# Patient Record
Sex: Female | Born: 1935
Health system: Southern US, Community
[De-identification: ages and names within clinical notes are randomized; demographics above are authoritative.]

## PROBLEM LIST (undated history)

## (undated) DIAGNOSIS — I10 Essential (primary) hypertension: Secondary | ICD-10-CM

## (undated) DIAGNOSIS — Z5189 Encounter for other specified aftercare: Secondary | ICD-10-CM

## (undated) DIAGNOSIS — M1990 Unspecified osteoarthritis, unspecified site: Secondary | ICD-10-CM

## (undated) HISTORY — PX: HEMORROIDECTOMY: SUR656

## (undated) HISTORY — PX: ABDOMINAL HYSTERECTOMY: SHX81

## (undated) HISTORY — PX: TUBAL LIGATION: SHX77

---

## 2001-08-24 ENCOUNTER — Emergency Department (HOSPITAL_COMMUNITY): Admission: EM | Admit: 2001-08-24 | Discharge: 2001-08-24 | Payer: Self-pay | Admitting: Emergency Medicine

## 2003-01-08 ENCOUNTER — Encounter: Payer: Self-pay | Admitting: Emergency Medicine

## 2003-01-08 ENCOUNTER — Emergency Department (HOSPITAL_COMMUNITY): Admission: EM | Admit: 2003-01-08 | Discharge: 2003-01-08 | Payer: Self-pay | Admitting: Emergency Medicine

## 2010-10-21 ENCOUNTER — Ambulatory Visit: Payer: Self-pay | Admitting: Internal Medicine

## 2011-02-01 ENCOUNTER — Emergency Department (HOSPITAL_COMMUNITY): Payer: Medicare Other

## 2011-02-01 ENCOUNTER — Emergency Department (HOSPITAL_COMMUNITY)
Admission: EM | Admit: 2011-02-01 | Discharge: 2011-02-01 | Disposition: A | Discharge status: Home / Self Care | Payer: Medicare Other | Attending: Emergency Medicine | Admitting: Emergency Medicine

## 2011-02-01 DIAGNOSIS — R030 Elevated blood-pressure reading, without diagnosis of hypertension: Secondary | ICD-10-CM | POA: Insufficient documentation

## 2011-02-01 DIAGNOSIS — R42 Dizziness and giddiness: Secondary | ICD-10-CM | POA: Insufficient documentation

## 2011-02-01 DIAGNOSIS — H9319 Tinnitus, unspecified ear: Secondary | ICD-10-CM | POA: Insufficient documentation

## 2011-02-01 DIAGNOSIS — R51 Headache: Secondary | ICD-10-CM | POA: Insufficient documentation

## 2011-02-01 LAB — COMPREHENSIVE METABOLIC PANEL
ALT: <8 U/L (ref 0–35)
BUN: 10 mg/dL (ref 6–23)
Calcium: 9.2 mg/dL (ref 8.4–10.5)
Creatinine, Ser: 0.66 mg/dL (ref 0.4–1.2)
Glucose, Bld: 98 mg/dL (ref 70–99)
Sodium: 140 mEq/L (ref 135–145)
Total Protein: 6.9 g/dL (ref 6.0–8.3)

## 2011-02-01 LAB — APTT: aPTT: 40 seconds — ABNORMAL HIGH (ref 24–37)

## 2011-02-01 LAB — DIFFERENTIAL
Basophils Absolute: 0 10*3/uL (ref 0.0–0.1)
Basophils Relative: 1 % (ref 0–1)
Eosinophils Absolute: 0.1 10*3/uL (ref 0.0–0.7)
Eosinophils Relative: 1 % (ref 0–5)
Lymphocytes Relative: 48 % — ABNORMAL HIGH (ref 12–46)
Lymphs Abs: 1.9 10*3/uL (ref 0.7–4.0)
Monocytes Absolute: 0.3 10*3/uL (ref 0.1–1.0)
Monocytes Relative: 8 % (ref 3–12)
Neutro Abs: 1.6 10*3/uL — ABNORMAL LOW (ref 1.7–7.7)
Neutrophils Relative %: 42 % — ABNORMAL LOW (ref 43–77)

## 2011-02-01 LAB — PROTIME-INR: INR: 1.08 (ref 0.00–1.49)

## 2011-02-01 LAB — CBC
Hemoglobin: 13.4 g/dL (ref 12.0–15.0)
MCH: 31 pg (ref 26.0–34.0)
MCHC: 34.1 g/dL (ref 30.0–36.0)
MCV: 91 fL (ref 78.0–100.0)
Platelets: 213 10*3/uL (ref 150–400)
RBC: 4.32 MIL/uL (ref 3.87–5.11)
RDW: 12.4 % (ref 11.5–15.5)
WBC: 3.9 10*3/uL — ABNORMAL LOW (ref 4.0–10.5)

## 2011-02-01 LAB — LIPASE, BLOOD: Lipase: 16 U/L (ref 11–59)

## 2013-09-12 ENCOUNTER — Encounter (HOSPITAL_COMMUNITY): Payer: Self-pay | Admitting: Emergency Medicine

## 2013-09-12 ENCOUNTER — Emergency Department (HOSPITAL_COMMUNITY)
Admission: EM | Admit: 2013-09-12 | Discharge: 2013-09-12 | Disposition: A | Discharge status: Home / Self Care | Payer: Medicare Other | Attending: Emergency Medicine | Admitting: Emergency Medicine

## 2013-09-12 DIAGNOSIS — M129 Arthropathy, unspecified: Secondary | ICD-10-CM | POA: Insufficient documentation

## 2013-09-12 DIAGNOSIS — I10 Essential (primary) hypertension: Secondary | ICD-10-CM | POA: Insufficient documentation

## 2013-09-12 DIAGNOSIS — Z90710 Acquired absence of both cervix and uterus: Secondary | ICD-10-CM | POA: Insufficient documentation

## 2013-09-12 DIAGNOSIS — N390 Urinary tract infection, site not specified: Secondary | ICD-10-CM | POA: Insufficient documentation

## 2013-09-12 DIAGNOSIS — Z9851 Tubal ligation status: Secondary | ICD-10-CM | POA: Insufficient documentation

## 2013-09-12 HISTORY — DX: Essential (primary) hypertension: I10

## 2013-09-12 HISTORY — DX: Encounter for other specified aftercare: Z51.89

## 2013-09-12 HISTORY — DX: Unspecified osteoarthritis, unspecified site: M19.90

## 2013-09-12 LAB — CBC WITH DIFFERENTIAL/PLATELET
Basophils Absolute: 0 10*3/uL (ref 0.0–0.1)
Basophils Relative: 0 % (ref 0–1)
HCT: 39.7 % (ref 36.0–46.0)
Hemoglobin: 13.6 g/dL (ref 12.0–15.0)
Lymphocytes Relative: 25 % (ref 12–46)
Monocytes Absolute: 0.6 10*3/uL (ref 0.1–1.0)
Monocytes Relative: 8 % (ref 3–12)
Neutro Abs: 5 10*3/uL (ref 1.7–7.7)
Neutrophils Relative %: 66 % (ref 43–77)
WBC: 7.5 10*3/uL (ref 4.0–10.5)

## 2013-09-12 LAB — BASIC METABOLIC PANEL
CO2: 28 mEq/L (ref 19–32)
Chloride: 105 mEq/L (ref 96–112)
Creatinine, Ser: 0.62 mg/dL (ref 0.50–1.10)
GFR calc Af Amer: >90 mL/min (ref >90)
Potassium: 3.9 mEq/L (ref 3.5–5.1)

## 2013-09-12 LAB — URINALYSIS, ROUTINE W REFLEX MICROSCOPIC
Nitrite: POSITIVE — AB
Specific Gravity, Urine: 1.012 (ref 1.005–1.030)
Urobilinogen, UA: 0.2 mg/dL (ref 0.0–1.0)
pH: 7.5 (ref 5.0–8.0)

## 2013-09-12 LAB — URINE MICROSCOPIC-ADD ON

## 2013-09-12 MED ORDER — PHENAZOPYRIDINE HCL 100 MG PO TABS
100.0000 mg | ORAL_TABLET | Freq: Once | ORAL | Status: AC
  Administered 2013-09-12: 100 mg via ORAL
  Filled 2013-09-12: qty 1

## 2013-09-12 MED ORDER — PHENAZOPYRIDINE HCL 200 MG PO TABS: 200.0000 mg | ORAL_TABLET | Freq: Three times a day (TID) | ORAL | Status: DC

## 2013-09-12 MED ORDER — CEPHALEXIN 250 MG PO CAPS
500.0000 mg | ORAL_CAPSULE | Freq: Once | ORAL | Status: AC
  Administered 2013-09-12: 500 mg via ORAL
  Filled 2013-09-12: qty 2

## 2013-09-12 MED ORDER — CEPHALEXIN 500 MG PO CAPS: 500.0000 mg | ORAL_CAPSULE | Freq: Four times a day (QID) | ORAL | Status: DC

## 2013-09-12 NOTE — ED Provider Notes (Signed)
CSN: [id number]     Arrival date & time 09/12/13  0930 History   First MD Initiated Contact with Patient 09/12/13 [phone]     Chief Complaint  Patient presents with  . Hematuria   (Consider location/radiation/quality/duration/timing/severity/associated sxs/prior Treatment) The history is provided by the patient.   this 77 year old female presents with the complaint of some mild left flank pain dysuria urinary frequency hematuria hematuria started this morning it is symptoms been present for a few days. No fever no vomiting no significant abdominal pain no severe flank pain. Patient states his discomfort is about 4/10. Radiates from the suprapubic area will but towards the left flank.  Past Medical History  Diagnosis Date  . Arthritis   . Blood transfusion without reported diagnosis   . Hypertension    Past Surgical History  Procedure Laterality Date  . Abdominal hysterectomy    . Tubal ligation    . Hemorroidectomy     No family history on file. History  Substance Use Topics  . Smoking status: Never Smoker   . Smokeless tobacco: Not on file  . Alcohol Use: No   OB History   Grav Para Term Preterm Abortions TAB SAB Ect Mult Living                 Review of Systems  Constitutional: Negative for fever and chills.  HENT: Negative for congestion.   Eyes: Negative for redness.  Respiratory: Negative for shortness of breath.   Cardiovascular: Negative for chest pain.  Gastrointestinal: Negative for nausea, vomiting and abdominal pain.  Genitourinary: Positive for dysuria, urgency, frequency and hematuria.  Musculoskeletal: Negative for back pain.  Neurological: Negative for headaches.  Hematological: Does not bruise/bleed easily.  Psychiatric/Behavioral: Negative for confusion.    Allergies  Review of patient's allergies indicates no known allergies.  Home Medications   Current Outpatient Rx  Name  Route  Sig  Dispense  Refill  . acetaminophen (TYLENOL) 500 MG tablet  Oral   Take 1,000 mg by mouth every 6 (six) hours as needed for mild pain or headache.         . ibuprofen (ADVIL,MOTRIN) 200 MG tablet   Oral   Take 400 mg by mouth every 6 (six) hours as needed for headache or mild pain.         . naproxen sodium (ANAPROX) 220 MG tablet   Oral   Take 440 mg by mouth daily as needed (for pain).         . cephALEXin (KEFLEX) 500 MG capsule   Oral   Take 1 capsule (500 mg total) by mouth 4 (four) times daily.   28 capsule   0   . phenazopyridine (PYRIDIUM) 200 MG tablet   Oral   Take 1 tablet (200 mg total) by mouth 3 (three) times daily.   6 tablet   0    BP 151/84  Pulse 71  Temp(Src) 98.7 F (37.1 C) (Oral)  Resp 16  SpO2 100% Physical Exam  Nursing note and vitals reviewed. Constitutional: She is oriented to person, place, and time. She appears well-developed and well-nourished. No distress.  HENT:  Head: Normocephalic and atraumatic.  Mouth/Throat: Oropharynx is clear and moist.  Eyes: Conjunctivae and EOM are normal. Pupils are equal, round, and reactive to light.  Neck: Normal range of motion. Neck supple.  Cardiovascular: Normal rate, regular rhythm and normal heart sounds.   No murmur heard. Pulmonary/Chest: Effort normal and breath sounds normal. No respiratory distress.  Abdominal: Soft. Bowel sounds are normal. There is no tenderness.  Neurological: She is alert and oriented to person, place, and time. No cranial nerve deficit. She exhibits normal muscle tone. Coordination normal.  Skin: Skin is warm. No rash noted.    ED Course  Procedures (including critical care time) Labs Review Labs Reviewed  URINALYSIS, ROUTINE W REFLEX MICROSCOPIC - Abnormal; Notable for the following:    APPearance CLOUDY (*)    Hgb urine dipstick LARGE (*)    Nitrite POSITIVE (*)    Leukocytes, UA LARGE (*)    All other components within normal limits  URINE MICROSCOPIC-ADD ON - Abnormal; Notable for the following:    Bacteria, UA FEW  (*)    All other components within normal limits  BASIC METABOLIC PANEL - Abnormal; Notable for the following:    Glucose, Bld 103 (*)    GFR calc non Af Amer 86 (*)    All other components within normal limits  URINE CULTURE  CBC WITH DIFFERENTIAL   Results for orders placed during the hospital encounter of 09/12/13  URINALYSIS, ROUTINE W REFLEX MICROSCOPIC      Result Value Range   Color, Urine YELLOW  YELLOW   APPearance CLOUDY (*) CLEAR   Specific Gravity, Urine 1.012  1.005 - 1.030   pH 7.5  5.0 - 8.0   Glucose, UA NEGATIVE  NEGATIVE mg/dL   Hgb urine dipstick LARGE (*) NEGATIVE   Bilirubin Urine NEGATIVE  NEGATIVE   Ketones, ur NEGATIVE  NEGATIVE mg/dL   Protein, ur NEGATIVE  NEGATIVE mg/dL   Urobilinogen, UA 0.2  0.0 - 1.0 mg/dL   Nitrite POSITIVE (*) NEGATIVE   Leukocytes, UA LARGE (*) NEGATIVE  URINE MICROSCOPIC-ADD ON      Result Value Range   WBC, UA 21-50  <3 WBC/hpf   RBC / HPF 21-50  <3 RBC/hpf   Bacteria, UA FEW (*) RARE  CBC WITH DIFFERENTIAL      Result Value Range   WBC 7.5  4.0 - 10.5 K/uL   RBC 4.28  3.87 - 5.11 MIL/uL   Hemoglobin 13.6  12.0 - 15.0 g/dL   HCT 16.1  09.6 - 04.5 %   MCV 92.8  78.0 - 100.0 fL   MCH 31.8  26.0 - 34.0 pg   MCHC 34.3  30.0 - 36.0 g/dL   RDW 40.9  81.1 - 91.4 %   Platelets 199  150 - 400 K/uL   Neutrophils Relative % 66  43 - 77 %   Neutro Abs 5.0  1.7 - 7.7 K/uL   Lymphocytes Relative 25  12 - 46 %   Lymphs Abs 1.9  0.7 - 4.0 K/uL   Monocytes Relative 8  3 - 12 %   Monocytes Absolute 0.6  0.1 - 1.0 K/uL   Eosinophils Relative 1  0 - 5 %   Eosinophils Absolute 0.1  0.0 - 0.7 K/uL   Basophils Relative 0  0 - 1 %   Basophils Absolute 0.0  0.0 - 0.1 K/uL  BASIC METABOLIC PANEL      Result Value Range   Sodium 142  135 - 145 mEq/L   Potassium 3.9  3.5 - 5.1 mEq/L   Chloride 105  96 - 112 mEq/L   CO2 28  19 - 32 mEq/L   Glucose, Bld 103 (*) 70 - 99 mg/dL   BUN 10  6 - 23 mg/dL   Creatinine, Ser 7.82  0.50 - 1.10  mg/dL   Calcium 9.3  8.4 - 45.4 mg/dL   GFR calc non Af Amer 86 (*) >90 mL/min   GFR calc Af Amer >90  >90 mL/min    Imaging Review No results found.  EKG Interpretation   None       MDM   1. UTI (lower urinary tract infection)    Symptoms and lab findings consistent with urinary tract infection. Patient treated with Keflex in the ED along with Pyridium feels better. We'll continue Keflex as an outpatient for 7 days patient will return for any new or worse symptoms. Renal function without any sniffing abnormalities no significant leukocytosis no evidence of pyelonephritis.    Shelda Jakes, MD 09/12/13 1455

## 2013-09-12 NOTE — ED Notes (Addendum)
Pt states that over the last 2 months she has had frequent urination with a burning sensation.  Pt states that this morning when she urinated she saw blood.

## 2013-09-12 NOTE — ED Notes (Signed)
Pt's daughter has been out to nurse's station x 2 to state that her mom did not know how to rate her pain.  Pt's daughter states that pt's pain is more likely 8 to 10.  Pt rated pain as 5 which was documented.

## 2013-09-12 NOTE — ED Notes (Signed)
Dr. Zackowski at bedside  

## 2013-09-13 LAB — URINE CULTURE

## 2013-09-14 NOTE — ED Notes (Addendum)
+   Urine Patient treated per protocol MD. 

## 2013-10-16 ENCOUNTER — Other Ambulatory Visit: Payer: Self-pay

## 2013-10-16 DIAGNOSIS — Z1231 Encounter for screening mammogram for malignant neoplasm of breast: Secondary | ICD-10-CM

## 2013-11-23 ENCOUNTER — Ambulatory Visit
Admission: RE | Admit: 2013-11-23 | Discharge: 2013-11-23 | Disposition: A | Discharge status: Home / Self Care | Payer: Medicare Other | Source: Ambulatory Visit

## 2013-11-23 DIAGNOSIS — Z1231 Encounter for screening mammogram for malignant neoplasm of breast: Secondary | ICD-10-CM

## 2014-12-20 DIAGNOSIS — E876 Hypokalemia: Secondary | ICD-10-CM | POA: Diagnosis not present

## 2014-12-20 DIAGNOSIS — R251 Tremor, unspecified: Secondary | ICD-10-CM | POA: Diagnosis not present

## 2014-12-20 DIAGNOSIS — I10 Essential (primary) hypertension: Secondary | ICD-10-CM | POA: Diagnosis not present

## 2014-12-20 DIAGNOSIS — R413 Other amnesia: Secondary | ICD-10-CM | POA: Diagnosis not present

## 2015-01-10 ENCOUNTER — Other Ambulatory Visit (HOSPITAL_COMMUNITY): Payer: Self-pay | Admitting: Nurse Practitioner

## 2015-01-10 DIAGNOSIS — R1310 Dysphagia, unspecified: Secondary | ICD-10-CM

## 2015-01-15 ENCOUNTER — Ambulatory Visit (HOSPITAL_COMMUNITY)
Admission: RE | Admit: 2015-01-15 | Discharge: 2015-01-15 | Disposition: A | Discharge status: Home / Self Care | Payer: Medicare Other | Source: Ambulatory Visit | Attending: Nurse Practitioner | Admitting: Nurse Practitioner

## 2015-01-15 DIAGNOSIS — R142 Eructation: Secondary | ICD-10-CM | POA: Insufficient documentation

## 2015-01-15 DIAGNOSIS — R079 Chest pain, unspecified: Secondary | ICD-10-CM | POA: Insufficient documentation

## 2015-01-15 DIAGNOSIS — R1310 Dysphagia, unspecified: Secondary | ICD-10-CM

## 2016-04-12 ENCOUNTER — Other Ambulatory Visit: Payer: Self-pay | Admitting: Internal Medicine

## 2016-04-12 ENCOUNTER — Other Ambulatory Visit: Payer: Self-pay | Admitting: Nurse Practitioner

## 2016-04-12 DIAGNOSIS — Z1231 Encounter for screening mammogram for malignant neoplasm of breast: Secondary | ICD-10-CM

## 2016-04-26 ENCOUNTER — Ambulatory Visit
Admission: RE | Admit: 2016-04-26 | Discharge: 2016-04-26 | Disposition: A | Discharge status: Home / Self Care | Payer: Medicare Other | Source: Ambulatory Visit | Attending: Internal Medicine | Admitting: Internal Medicine

## 2016-04-26 DIAGNOSIS — Z1231 Encounter for screening mammogram for malignant neoplasm of breast: Secondary | ICD-10-CM

## 2018-03-03 ENCOUNTER — Other Ambulatory Visit: Payer: Self-pay | Admitting: Nurse Practitioner

## 2018-03-03 DIAGNOSIS — R51 Headache: Principal | ICD-10-CM

## 2018-03-03 DIAGNOSIS — R519 Headache, unspecified: Secondary | ICD-10-CM

## 2018-05-04 DIAGNOSIS — I10 Essential (primary) hypertension: Secondary | ICD-10-CM | POA: Diagnosis not present

## 2018-05-04 DIAGNOSIS — M2550 Pain in unspecified joint: Secondary | ICD-10-CM | POA: Diagnosis not present

## 2018-07-29 ENCOUNTER — Observation Stay (HOSPITAL_COMMUNITY)
Admission: EM | Admit: 2018-07-29 | Discharge: 2018-07-30 | Disposition: A | Discharge status: Home / Self Care | Payer: Medicare Other | Attending: Internal Medicine | Admitting: Internal Medicine

## 2018-07-29 ENCOUNTER — Emergency Department (HOSPITAL_COMMUNITY): Payer: Medicare Other

## 2018-07-29 ENCOUNTER — Encounter (HOSPITAL_COMMUNITY): Payer: Self-pay | Admitting: Emergency Medicine

## 2018-07-29 ENCOUNTER — Other Ambulatory Visit: Payer: Self-pay

## 2018-07-29 DIAGNOSIS — M19012 Primary osteoarthritis, left shoulder: Principal | ICD-10-CM | POA: Insufficient documentation

## 2018-07-29 DIAGNOSIS — R110 Nausea: Secondary | ICD-10-CM | POA: Diagnosis not present

## 2018-07-29 DIAGNOSIS — Z9889 Other specified postprocedural states: Secondary | ICD-10-CM | POA: Insufficient documentation

## 2018-07-29 DIAGNOSIS — Z90710 Acquired absence of both cervix and uterus: Secondary | ICD-10-CM | POA: Insufficient documentation

## 2018-07-29 DIAGNOSIS — Z8249 Family history of ischemic heart disease and other diseases of the circulatory system: Secondary | ICD-10-CM | POA: Diagnosis not present

## 2018-07-29 DIAGNOSIS — M25512 Pain in left shoulder: Secondary | ICD-10-CM

## 2018-07-29 DIAGNOSIS — E876 Hypokalemia: Secondary | ICD-10-CM | POA: Diagnosis not present

## 2018-07-29 DIAGNOSIS — I10 Essential (primary) hypertension: Secondary | ICD-10-CM | POA: Insufficient documentation

## 2018-07-29 DIAGNOSIS — R079 Chest pain, unspecified: Secondary | ICD-10-CM | POA: Diagnosis present

## 2018-07-29 DIAGNOSIS — Z79899 Other long term (current) drug therapy: Secondary | ICD-10-CM | POA: Diagnosis not present

## 2018-07-29 DIAGNOSIS — K219 Gastro-esophageal reflux disease without esophagitis: Secondary | ICD-10-CM | POA: Diagnosis not present

## 2018-07-29 LAB — I-STAT TROPONIN, ED: TROPONIN I, POC: 0 ng/mL (ref 0.00–0.08)

## 2018-07-29 LAB — CBC WITH DIFFERENTIAL/PLATELET
ABS IMMATURE GRANULOCYTES: 0 10*3/uL (ref 0.0–0.1)
BASOS PCT: 1 %
Basophils Absolute: 0 10*3/uL (ref 0.0–0.1)
Eosinophils Absolute: 0 10*3/uL (ref 0.0–0.7)
Eosinophils Relative: 1 %
HEMATOCRIT: 37 % (ref 36.0–46.0)
HEMOGLOBIN: 12.2 g/dL (ref 12.0–15.0)
Immature Granulocytes: 0 %
LYMPHS PCT: 38 %
Lymphs Abs: 1.9 10*3/uL (ref 0.7–4.0)
MCH: 31.1 pg (ref 26.0–34.0)
MCHC: 33 g/dL (ref 30.0–36.0)
MCV: 94.4 fL (ref 78.0–100.0)
MONO ABS: 0.5 10*3/uL (ref 0.1–1.0)
Monocytes Relative: 10 %
NEUTROS ABS: 2.4 10*3/uL (ref 1.7–7.7)
Neutrophils Relative %: 50 %
Platelets: 274 10*3/uL (ref 150–400)
RBC: 3.92 MIL/uL (ref 3.87–5.11)
RDW: 12.2 % (ref 11.5–15.5)
WBC: 4.9 10*3/uL (ref 4.0–10.5)

## 2018-07-29 LAB — COMPREHENSIVE METABOLIC PANEL
ALK PHOS: 43 U/L (ref 38–126)
ALT: 16 U/L (ref 0–44)
AST: 20 U/L (ref 15–41)
Albumin: 3.8 g/dL (ref 3.5–5.0)
Anion gap: 12 (ref 5–15)
BILIRUBIN TOTAL: 1.2 mg/dL (ref 0.3–1.2)
BUN: 10 mg/dL (ref 8–23)
CALCIUM: 9.7 mg/dL (ref 8.9–10.3)
CO2: 28 mmol/L (ref 22–32)
CREATININE: 0.79 mg/dL (ref 0.44–1.00)
Chloride: 96 mmol/L — ABNORMAL LOW (ref 98–111)
Glucose, Bld: 116 mg/dL — ABNORMAL HIGH (ref 70–99)
Potassium: 3.2 mmol/L — ABNORMAL LOW (ref 3.5–5.1)
Sodium: 136 mmol/L (ref 135–145)
Total Protein: 6.4 g/dL — ABNORMAL LOW (ref 6.5–8.1)

## 2018-07-29 MED ORDER — ASPIRIN 81 MG PO CHEW
324.0000 mg | CHEWABLE_TABLET | Freq: Once | ORAL | Status: AC
  Administered 2018-07-30: 324 mg via ORAL
  Filled 2018-07-29: qty 4

## 2018-07-29 MED ORDER — NITROGLYCERIN 0.4 MG SL SUBL: 0.4000 mg | SUBLINGUAL_TABLET | SUBLINGUAL | Status: DC | PRN

## 2018-07-29 NOTE — ED Triage Notes (Signed)
Pt presents with L shoulder pain x 1 wk; denies any known injury

## 2018-07-29 NOTE — ED Provider Notes (Signed)
[facility] EMERGENCY DEPARTMENT Provider Note   CSN: [id number] Arrival date & time: 07/29/18  1821     History   Chief Complaint Chief Complaint  Patient presents with  . Shoulder Pain    HPI Patricia Simmons is a 82 y.o. female.  HPI   Left shoulder pain radiating to elbow, occurs intermittently, taking tylenol and icy hot for symptoms. No numbness or tingling. No chest pain or dyspnea.  Patient is a difficult historian.  Describes the left shoulder pain as a tightness.  Reports occasional nausea with the pain.  Denies shortness of breath or chest pain, but does report she sometimes has acid reflux where she needs to hit her chest and feels that it is difficult to breathe.  This does not feel different recently.  Reports that over the last 3 days the left shoulder pain has been worse.  It comes and goes.  Reports she notices it when she is cleaning around the house or cooking, but also sometimes will notice it just walking. No abdominal pain.  Past Medical History:  Diagnosis Date  . Arthritis   . Blood transfusion without reported diagnosis   . Hypertension     Patient Active Problem List   Diagnosis Date Noted  . Hypokalemia 07/30/2018  . Chest pain 07/30/2018  . GERD (gastroesophageal reflux disease) 07/30/2018    Past Surgical History:  Procedure Laterality Date  . ABDOMINAL HYSTERECTOMY    . HEMORROIDECTOMY    . TUBAL LIGATION       OB History   None      Home Medications    Prior to Admission medications   Medication Sig Start Date End Date Taking? Authorizing Provider  acetaminophen (TYLENOL) 500 MG tablet Take 1,000 mg by mouth every 6 (six) hours as needed for mild pain or headache.   Yes [provider]  hydrochlorothiazide (HYDRODIURIL) 12.5 MG tablet Take 12.5 mg by mouth daily. 07/07/18  Yes [provider]  losartan (COZAAR) 50 MG tablet Take 50 mg by mouth daily. 07/07/18  Yes [provider]    omeprazole (PRILOSEC) 40 MG capsule Take 40 mg by mouth daily. 07/07/18  Yes [provider]    Family History History reviewed. No pertinent family history.  Social History Social History   Tobacco Use  . Smoking status: Never Smoker  Substance Use Topics  . Alcohol use: No  . Drug use: No     Allergies   Patient has no known allergies.   Review of Systems Review of Systems  Constitutional: Negative for fever.  HENT: Negative for sore throat.   Eyes: Negative for visual disturbance.  Respiratory: Negative for cough and shortness of breath.   Cardiovascular: Negative for chest pain.  Gastrointestinal: Positive for nausea. Negative for abdominal pain and vomiting.  Genitourinary: Negative for difficulty urinating.  Musculoskeletal: Positive for arthralgias and myalgias. Negative for back pain and neck pain.  Skin: Negative for rash.  Neurological: Negative for syncope and headaches.     Physical Exam Updated Vital Signs BP (!) 155/90   Pulse 81   Temp 99.6 F (37.6 C) (Oral)   Resp (!) 23   SpO2 100%   Physical Exam  Constitutional: She is oriented to person, place, and time. She appears well-developed and well-nourished. No distress.  HENT:  Head: Normocephalic and atraumatic.  Eyes: Conjunctivae and EOM are normal.  Neck: Normal range of motion.  Cardiovascular: Normal rate, regular rhythm, normal heart sounds and  intact distal pulses. Exam reveals no gallop and no friction rub.  No murmur heard. Pulmonary/Chest: Effort normal and breath sounds normal. No respiratory distress. She has no wheezes. She has no rales.  Abdominal: Soft. She exhibits no distension. There is no tenderness. There is no guarding.  Musculoskeletal: She exhibits no edema or tenderness.  Full ROM of shoulder Mild tenderness of biceps  Neurological: She is alert and oriented to person, place, and time.  Skin: Skin is warm and dry. No rash noted. She is not diaphoretic. No  erythema.  Nursing note and vitals reviewed.    ED Treatments / Results  Labs (all labs ordered are listed, but only abnormal results are displayed) Labs Reviewed  COMPREHENSIVE METABOLIC PANEL - Abnormal; Notable for the following components:      Result Value   Potassium 3.2 (*)    Chloride 96 (*)    Glucose, Bld 116 (*)    Total Protein 6.4 (*)    All other components within normal limits  CBC WITH DIFFERENTIAL/PLATELET  I-STAT TROPONIN, ED  I-STAT TROPONIN, ED    EKG EKG Interpretation  Date/Time:  Saturday July 29 2018 20:24:47 EDT Ventricular Rate:  70 PR Interval:    QRS Duration: 83 QT Interval:  384 QTC Calculation: 415 R Axis:   -14 Text Interpretation:  Sinus rhythm Borderline T wave abnormalities No previous ECGs available Confirmed by Alvira MondaySchlossman, Aariel Ems ([id number]) on 07/29/2018 9:00:35 PM   Radiology Dg Shoulder Left  Result Date: 07/29/2018 CLINICAL DATA:  Left shoulder pain for the past week. No known injury. EXAM: LEFT SHOULDER - 2+ VIEW COMPARISON:  None. FINDINGS: Minimal glenohumeral spur formation. Otherwise, normal appearing bones and soft tissues. No fracture or dislocation. IMPRESSION: Minimal degenerative changes. Electronically Signed   By: Beckie SaltsSteven  Reid M.D.   On: 07/29/2018 19:54    Procedures Procedures (including critical care time)  Medications Ordered in ED Medications  nitroGLYCERIN (NITROSTAT) SL tablet 0.4 mg (has no administration in time range)  aspirin chewable tablet 324 mg (324 mg Oral Given 07/30/18 0026)     Initial Impression / Assessment and Plan / ED Course  I have reviewed the triage vital signs and the nursing notes.  Pertinent labs & imaging results that were available during my care of the patient were reviewed by me and considered in my medical decision making (see chart for details).     82yo female with history of hypertension presents with concern for left shoulder pain.  No trauma. No signs of septic arthritis.  XR with mild degenerative changes.    Patient is an unclear historian, and it is unclear if symptoms are musculoskeletal or may represent anginal equivalent by history. While she reports pain with lifting and cleaning, she also reports shoulder pain worsening just with walking to the bathroom in the ED, and given exertional symptoms (not associated with shoulder movements) I am concerned this represents angina. Given age, risk factors, and potential anginal symptoms, will admit for CP evaluation.  Given nitro, ASA.   Final Clinical Impressions(s) / ED Diagnoses   Final diagnoses:  Acute pain of left shoulder  Nontraumatic pain of left shoulder    ED Discharge Orders    None       Alvira MondaySchlossman, Ariez Neilan, MD 07/30/18 [phone]

## 2018-07-29 NOTE — ED Notes (Signed)
Patient transported to X-ray 

## 2018-07-30 DIAGNOSIS — K219 Gastro-esophageal reflux disease without esophagitis: Secondary | ICD-10-CM

## 2018-07-30 DIAGNOSIS — R0789 Other chest pain: Secondary | ICD-10-CM

## 2018-07-30 DIAGNOSIS — I10 Essential (primary) hypertension: Secondary | ICD-10-CM

## 2018-07-30 DIAGNOSIS — R079 Chest pain, unspecified: Secondary | ICD-10-CM | POA: Diagnosis present

## 2018-07-30 DIAGNOSIS — E876 Hypokalemia: Secondary | ICD-10-CM | POA: Diagnosis not present

## 2018-07-30 LAB — TROPONIN I

## 2018-07-30 LAB — I-STAT TROPONIN, ED: Troponin i, poc: 0 ng/mL (ref 0.00–0.08)

## 2018-07-30 MED ORDER — GI COCKTAIL ~~LOC~~: 30.0000 mL | Freq: Four times a day (QID) | ORAL | Status: DC | PRN

## 2018-07-30 MED ORDER — ENOXAPARIN SODIUM 40 MG/0.4ML ~~LOC~~ SOLN
40.0000 mg | SUBCUTANEOUS | Status: DC
  Administered 2018-07-30: 40 mg via SUBCUTANEOUS
  Filled 2018-07-30: qty 0.4

## 2018-07-30 MED ORDER — LOSARTAN POTASSIUM 50 MG PO TABS
50.0000 mg | ORAL_TABLET | Freq: Every day | ORAL | Status: DC
  Administered 2018-07-30: 50 mg via ORAL
  Filled 2018-07-30: qty 1

## 2018-07-30 MED ORDER — POTASSIUM CHLORIDE CRYS ER 20 MEQ PO TBCR
20.0000 meq | EXTENDED_RELEASE_TABLET | Freq: Once | ORAL | Status: AC
  Administered 2018-07-30: 20 meq via ORAL
  Filled 2018-07-30: qty 1

## 2018-07-30 MED ORDER — INFLUENZA VAC SPLIT HIGH-DOSE 0.5 ML IM SUSY
0.5000 mL | PREFILLED_SYRINGE | INTRAMUSCULAR | Status: DC
  Filled 2018-07-30: qty 0.5

## 2018-07-30 MED ORDER — ONDANSETRON HCL 4 MG/2ML IJ SOLN: 4.0000 mg | Freq: Four times a day (QID) | INTRAMUSCULAR | Status: DC | PRN

## 2018-07-30 MED ORDER — PANTOPRAZOLE SODIUM 40 MG PO TBEC
40.0000 mg | DELAYED_RELEASE_TABLET | Freq: Every day | ORAL | Status: DC
  Administered 2018-07-30: 40 mg via ORAL
  Filled 2018-07-30: qty 1

## 2018-07-30 MED ORDER — HYDROCHLOROTHIAZIDE 25 MG PO TABS
12.5000 mg | ORAL_TABLET | Freq: Every day | ORAL | Status: DC
  Administered 2018-07-30: 12.5 mg via ORAL
  Filled 2018-07-30: qty 1

## 2018-07-30 MED ORDER — ACETAMINOPHEN 500 MG PO TABS
1000.0000 mg | ORAL_TABLET | Freq: Four times a day (QID) | ORAL | Status: DC | PRN
  Administered 2018-07-30: 1000 mg via ORAL
  Filled 2018-07-30: qty 2

## 2018-07-30 NOTE — Discharge Summary (Signed)
Physician Discharge Summary  Patricia Simmons UYQ:[id number] DOB: 30-Oct-1936 DOA: 07/29/2018  PCP: [name], Robyn, MD  Admit date: 07/29/2018 Discharge date: 07/30/2018  Admitted From: Home    Disposition: Home  Recommendations for Outpatient Follow-up:  1. Follow up with PCP this week with BMP and CBC 2. Gallbladder ultrasound as an outpatient 3. Follow-up with orthopedics regarding shoulder pain  Home Health: None Equipment/Devices: None  Discharge Condition: Stable   CODE STATUS: FULL Diet recommendation: Heart Healthy  Brief/Interim Summary:  82 year old woman with a history of hypertension, GERD, osteoarthritis, presenting to the emergency department with chest discomfort, and left shoulder pain.Symptoms were present for about 3 months, but were worse this week, with persistent left arm pain, 6 out of 10.  She denies any shortness of breath, nausea, vomiting or diaphoresis.  She denies any abdominal pain.  She denies any lower extremity swelling or calf pain.  She never had a prior cardiac work-up or history.  Does have remote family history of coronary artery disease.  She does not take aspirin at home.  She denies any recent long distance trips, or being on hormonal agents.  Compliant with her medications.  She was admitted for further work-up.  EKG showed normal sinus rhythm at the rate of 70, without significant ST-T changes, or old EKG to compare.  Troponins were negative.  Does not smoke, drink heavy alcohol, or partake recreational drugs.  Discharge Diagnoses:  Principal Problem:   Chest pain Active Problems:   Hypokalemia   GERD (gastroesophageal reflux disease)   Essential hypertension   Chest pain syndrome she presented with atypical chest pain, but during her hospitalization, her serial enzymes x3 were negative, EKG was unremarkable.  Chest x-ray was negative.  Likely the symptoms are of musculoskeletal nature.  During her hospitalization, she did have shoulder pain, worse  with movement, and reproducible.  She was given morphine, with significant improvement of her symptoms.  She also received Protonix.  In addition, the patient had a shoulder x-ray, which was negative for acute disease, minimal degenerative changes. Discharged home in stable condition. Follow-up with PCP within this week, with chemistries and CBC Continue PPIs, symptoms may be related to GERD causing chest pain Consider gallbladder ultrasound as an outpatient, rule out gallbladder disease For her shoulder pain, which may be of musculoskeletal nature, referred to orthopedics, for further evaluation, and possible PT. Baby aspirin daily  GERD, Received IV Protonix while inpatient   continue Prilosec Aloe up with PCP as an outpatient  Hypokalemia, likely due to HCTZ.  Potassium was 3.2, which was replenished with 40 mEq oral. Follow electrolytes upon discharge, as she may need potassium supplementation at home    Hypertension BP (!) 146/81   Pulse 65    Controlled Continue home anti-hypertensive medications with Cozaar and HCTZ      Discharge Instructions  Discharge Instructions    Call MD for:  difficulty breathing, headache or visual disturbances   Complete by:  As directed    Call MD for:  extreme fatigue   Complete by:  As directed    Call MD for:  hives   Complete by:  As directed    Call MD for:  persistant dizziness or light-headedness   Complete by:  As directed    Call MD for:  persistant nausea and vomiting   Complete by:  As directed    Call MD for:  redness, tenderness, or signs of infection (pain, swelling, redness, odor or green/yellow discharge around incision  site)   Complete by:  As directed    Call MD for:  severe uncontrolled pain   Complete by:  As directed    Call MD for:  temperature >100.4   Complete by:  As directed    Diet - low sodium heart healthy   Complete by:  As directed    Discharge instructions   Complete by:  As directed    Follow up with  primary doctor for post hospital visit, with CBC and BMP labs Recommend Gallbladder ultrasound as an outpatient Follow-up with orthopedics regarding shoulder pain.   Increase activity slowly   Complete by:  As directed    No wound care   Complete by:  As directed      Allergies as of 07/30/2018   No Known Allergies     Medication List    TAKE these medications   acetaminophen 500 MG tablet Commonly known as:  TYLENOL Take 1,000 mg by mouth every 6 (six) hours as needed for mild pain or headache.   hydrochlorothiazide 12.5 MG tablet Commonly known as:  HYDRODIURIL Take 12.5 mg by mouth daily.   losartan 50 MG tablet Commonly known as:  COZAAR Take 50 mg by mouth daily.   omeprazole 40 MG capsule Commonly known as:  PRILOSEC Take 40 mg by mouth daily.       No Known Allergies  Consultations: None   Procedures/Studies: Dg Shoulder Left  Result Date: 07/29/2018 CLINICAL DATA:  Left shoulder pain for the past week. No known injury. EXAM: LEFT SHOULDER - 2+ VIEW COMPARISON:  None. FINDINGS: Minimal glenohumeral spur formation. Otherwise, normal appearing bones and soft tissues. No fracture or dislocation. IMPRESSION: Minimal degenerative changes. Electronically Signed   By: Beckie Salts M.D.   On: 07/29/2018 19:54      Subjective:  She denies any central chest pain at this tim.  She denies any shortness of breath, nausea, vomiting or diaphoresis.  She denies any abdominal pain.  She denies any lower extremity swelling or calf pain    Discharge Exam: Vitals:   07/30/18 0604 07/30/18 1222  BP: 130/79 (!) 146/81  Pulse: 68 65  Resp: 20 16  Temp: 97.6 F (36.4 C) 98.4 F (36.9 C)  SpO2: 100% 99%   Vitals:   07/29/18 2300 07/30/18 0128 07/30/18 0604 07/30/18 1222  BP: (!) 155/90 (!) 167/88 130/79 (!) 146/81  Pulse: 81 78 68 65  Resp: (!) 23 20 20 16   Temp:  98.2 F (36.8 C) 97.6 F (36.4 C) 98.4 F (36.9 C)  TempSrc:  Oral Oral Oral  SpO2: 100% 100% 100% 99%   Weight:  69.1 kg    Height:  5\' 5"  (1.651 m)      General: Pt is alert, awake, not in acute distress Cardiovascular: RRR, S1/S2 +, no rubs, no gallops Respiratory: CTA bilaterally, no wheezing, no rhonchi Abdominal: Soft, NT, ND, bowel sounds + Extremities: no edema, no cyanosis Musculoskeletal, pain in her left shoulder is reproducible, but is not worse with movement.  No other areas of tenderness.    The results of significant diagnostics from this hospitalization (including imaging, microbiology, ancillary and laboratory) are listed below for reference.     Microbiology: No results found for this or any previous visit (from the past 240 hour(s)).   Labs: BNP (last 3 results) No results for input(s): BNP in the last 8760 hours. Basic Metabolic Panel: Recent Labs  Lab 07/29/18 2021  NA 136  K 3.2*  CL 96*  CO2 28  GLUCOSE 116*  BUN 10  CREATININE 0.79  CALCIUM 9.7   Liver Function Tests: Recent Labs  Lab 07/29/18 2021  AST 20  ALT 16  ALKPHOS 43  BILITOT 1.2  PROT 6.4*  ALBUMIN 3.8   No results for input(s): LIPASE, AMYLASE in the last 168 hours. No results for input(s): AMMONIA in the last 168 hours. CBC: Recent Labs  Lab 07/29/18 2021  WBC 4.9  NEUTROABS 2.4  HGB 12.2  HCT 37.0  MCV 94.4  PLT 274   Cardiac Enzymes: Recent Labs  Lab 07/30/18 1026  TROPONINI <0.03   BNP: Invalid input(s): POCBNP CBG: No results for input(s): GLUCAP in the last 168 hours. D-Dimer No results for input(s): DDIMER in the last 72 hours. Hgb A1c No results for input(s): HGBA1C in the last 72 hours. Lipid Profile No results for input(s): CHOL, HDL, LDLCALC, TRIG, CHOLHDL, LDLDIRECT in the last 72 hours. Thyroid function studies No results for input(s): TSH, T4TOTAL, T3FREE, THYROIDAB in the last 72 hours.  Invalid input(s): FREET3 Anemia work up No results for input(s): VITAMINB12, FOLATE, FERRITIN, TIBC, IRON, RETICCTPCT in the last 72 hours. Urinalysis     Component Value Date/Time   COLORURINE YELLOW 09/12/2013 1127   APPEARANCEUR CLOUDY (A) 09/12/2013 1127   LABSPEC 1.012 09/12/2013 1127   PHURINE 7.5 09/12/2013 1127   GLUCOSEU NEGATIVE 09/12/2013 1127   HGBUR LARGE (A) 09/12/2013 1127   BILIRUBINUR NEGATIVE 09/12/2013 1127   KETONESUR NEGATIVE 09/12/2013 1127   PROTEINUR NEGATIVE 09/12/2013 1127   UROBILINOGEN 0.2 09/12/2013 1127   NITRITE POSITIVE (A) 09/12/2013 1127   LEUKOCYTESUR LARGE (A) 09/12/2013 1127   Sepsis Labs Invalid input(s): PROCALCITONIN,  WBC,  LACTICIDVEN Microbiology No results found for this or any previous visit (from the past 240 hour(s)).   Time coordinating discharge: Over 30 minutes  SIGNED:   Marlowe Kays, MD  [facility] 07/30/2018, 1:39 PM   If 7PM-7AM, please contact night-coverage [email or web address] Password [email or web address]

## 2018-07-30 NOTE — H&P (Signed)
History and Physical   Patricia Simmons UJW:[id number] DOB: July 19, 1936 DOA: 07/29/2018  Referring MD/NP/PA: Dr. Dalene Seltzer  PCP: Dorothyann Peng, MD   Outpatient Specialists: None  Patient coming from: Home  Chief Complaint: Chest pain  HPI: Patricia Simmons is a 82 y.o. female with medical history significant of hypertension and GERD also osteoarthritis who had significant central chest pain radiating to her left shoulder.  Symptoms have been going on and off for couple of months now but got worse this week with persistent left arm pain.  Pain was rated as 6 out of 10.  Relieved by morphine in the ER.  Denied significant shortness of breath no delirium.  No nausea vomiting or diarrhea.  Patient was seen in the ER and evaluated.  She had no prior cardiac work-up or history.  Due to risk factors which include the hypertension age and remote family history of coronary artery disease patient is being admitted for work-up..  ED Course: Vitals show temperature 99 blood pressure 154/95 pulse 88 respiratory of 23 oxygen sat 98% room air.  White count is 4.9 with hemoglobin 12.2 platelets 274.  Sodium 136 potassium 3.2 chloride 96 with glucose of 116.  Chest x-ray is negative.  EKG showed no active disease.  Initial cardiac enzymes are negative.  Patient is being admitted for rule out MI.  Review of Systems: As per HPI otherwise 10 point review of systems negative.    Past Medical History:  Diagnosis Date  . Arthritis   . Blood transfusion without reported diagnosis   . Hypertension     Past Surgical History:  Procedure Laterality Date  . ABDOMINAL HYSTERECTOMY    . HEMORROIDECTOMY    . TUBAL LIGATION       reports that she has never smoked. She does not have any smokeless tobacco history on file. She reports that she does not drink alcohol or use drugs.  No Known Allergies  History reviewed. No pertinent family history.   Prior to Admission medications   Medication Sig Start Date End  Date Taking? Authorizing Provider  acetaminophen (TYLENOL) 500 MG tablet Take 1,000 mg by mouth every 6 (six) hours as needed for mild pain or headache.   Yes [provider]  hydrochlorothiazide (HYDRODIURIL) 12.5 MG tablet Take 12.5 mg by mouth daily. 07/07/18  Yes [provider]  losartan (COZAAR) 50 MG tablet Take 50 mg by mouth daily. 07/07/18  Yes [provider]  omeprazole (PRILOSEC) 40 MG capsule Take 40 mg by mouth daily. 07/07/18  Yes [provider]    Physical Exam: Vitals:   07/29/18 2045 07/29/18 2130 07/29/18 2200 07/29/18 2230  BP: 138/83 139/77 133/85 (!) 121/92  Pulse: 72 69 73 70  Resp: (!) 23 15 14 17   Temp:      TempSrc:      SpO2: 98% 100% 99% 98%      Constitutional: NAD, calm, comfortable Vitals:   07/29/18 2045 07/29/18 2130 07/29/18 2200 07/29/18 2230  BP: 138/83 139/77 133/85 (!) 121/92  Pulse: 72 69 73 70  Resp: (!) 23 15 14 17   Temp:      TempSrc:      SpO2: 98% 100% 99% 98%   Eyes: PERRL, lids and conjunctivae normal ENMT: Mucous membranes are moist. Posterior pharynx clear of any exudate or lesions.Normal dentition.  Neck: normal, supple, no masses, no thyromegaly Respiratory: clear to auscultation bilaterally, no wheezing, no crackles. Normal respiratory effort. No accessory muscle use.  Cardiovascular: Regular  rate and rhythm, no murmurs / rubs / gallops. No extremity edema. 2+ pedal pulses. No carotid bruits.  Abdomen: no tenderness, no masses palpated. No hepatosplenomegaly. Bowel sounds positive.  Musculoskeletal: no clubbing / cyanosis. No joint deformity upper and lower extremities. Good ROM, no contractures. Normal muscle tone.  Skin: no rashes, lesions, ulcers. No induration Neurologic: CN 2-12 grossly intact. Sensation intact, DTR normal. Strength 5/5 in all 4.  Psychiatric: Normal judgment and insight. Alert and oriented x 3. Normal mood.     Labs on Admission: I have personally reviewed following  labs and imaging studies  CBC: Recent Labs  Lab 07/29/18 2021  WBC 4.9  NEUTROABS 2.4  HGB 12.2  HCT 37.0  MCV 94.4  PLT 274   Basic Metabolic Panel: Recent Labs  Lab 07/29/18 2021  NA 136  K 3.2*  CL 96*  CO2 28  GLUCOSE 116*  BUN 10  CREATININE 0.79  CALCIUM 9.7   GFR: CrCl cannot be calculated (Unknown ideal weight.). Liver Function Tests: Recent Labs  Lab 07/29/18 2021  AST 20  ALT 16  ALKPHOS 43  BILITOT 1.2  PROT 6.4*  ALBUMIN 3.8   No results for input(s): LIPASE, AMYLASE in the last 168 hours. No results for input(s): AMMONIA in the last 168 hours. Coagulation Profile: No results for input(s): INR, PROTIME in the last 168 hours. Cardiac Enzymes: No results for input(s): CKTOTAL, CKMB, CKMBINDEX, TROPONINI in the last 168 hours. BNP (last 3 results) No results for input(s): PROBNP in the last 8760 hours. HbA1C: No results for input(s): HGBA1C in the last 72 hours. CBG: No results for input(s): GLUCAP in the last 168 hours. Lipid Profile: No results for input(s): CHOL, HDL, LDLCALC, TRIG, CHOLHDL, LDLDIRECT in the last 72 hours. Thyroid Function Tests: No results for input(s): TSH, T4TOTAL, FREET4, T3FREE, THYROIDAB in the last 72 hours. Anemia Panel: No results for input(s): VITAMINB12, FOLATE, FERRITIN, TIBC, IRON, RETICCTPCT in the last 72 hours. Urine analysis:    Component Value Date/Time   COLORURINE YELLOW 09/12/2013 1127   APPEARANCEUR CLOUDY (A) 09/12/2013 1127   LABSPEC 1.012 09/12/2013 1127   PHURINE 7.5 09/12/2013 1127   GLUCOSEU NEGATIVE 09/12/2013 1127   HGBUR LARGE (A) 09/12/2013 1127   BILIRUBINUR NEGATIVE 09/12/2013 1127   KETONESUR NEGATIVE 09/12/2013 1127   PROTEINUR NEGATIVE 09/12/2013 1127   UROBILINOGEN 0.2 09/12/2013 1127   NITRITE POSITIVE (A) 09/12/2013 1127   LEUKOCYTESUR LARGE (A) 09/12/2013 1127   Sepsis Labs: @LABRCNTIP (procalcitonin:4,lacticidven:4) )No results found for this or any previous visit (from the  past 240 hour(s)).   Radiological Exams on Admission: Dg Shoulder Left  Result Date: 07/29/2018 CLINICAL DATA:  Left shoulder pain for the past week. No known injury. EXAM: LEFT SHOULDER - 2+ VIEW COMPARISON:  None. FINDINGS: Minimal glenohumeral spur formation. Otherwise, normal appearing bones and soft tissues. No fracture or dislocation. IMPRESSION: Minimal degenerative changes. Electronically Signed   By: Beckie Salts M.D.   On: 07/29/2018 19:54    EKG: Independently reviewed.  EKG showed normal sinus rhythm with a rate of 70.  No significant ST changes.  No old EKG to compare  Assessment/Plan Principal Problem:   Chest pain Active Problems:   Hypokalemia   GERD (gastroesophageal reflux disease)     #1 chest pain: Atypical but patient has significant risk factors.  She will be admitted for observation.  Check serial enzymes x3.  Nitroglycerin, continue blood pressure control oxygen and morphine as needed.  Consider echocardiogram in the  near future.  #2 GERD: Continue with PPIs.  Symptoms may be related to GERD causing the chest pain.  #3 hypokalemia: Patient on hydrochlorothiazide.  Replete potassium.  She might require continued potassium supplementation at home.   DVT prophylaxis: Lovenox  Code Status: Full code Family Communication: No family available discussed care with patient Disposition Plan: Home Consults called: None Admission status: Observation  Severity of Illness: The appropriate patient status for this patient is OBSERVATION. Observation status is judged to be reasonable and necessary in order to provide the required intensity of service to ensure the patient's safety. The patient's presenting symptoms, physical exam findings, and initial radiographic and laboratory data in the context of their medical condition is felt to place them at decreased risk for further clinical deterioration. Furthermore, it is anticipated that the patient will be medically stable for  discharge from the hospital within 2 midnights of admission. The following factors support the patient status of observation.   " The patient's presenting symptoms include chest pain and left arm pain. " The physical exam findings include normal physical exam. " The initial radiographic and laboratory data are potassium three-point.     Lonia BloodGARBA,LAWAL MD [facility] Pager [phone]  If 7PM-7AM, please contact night-coverage [email or web address] Password [email or web address]  07/30/2018, 12:20 AM

## 2018-08-07 DIAGNOSIS — K219 Gastro-esophageal reflux disease without esophagitis: Secondary | ICD-10-CM | POA: Diagnosis not present

## 2018-08-07 DIAGNOSIS — R079 Chest pain, unspecified: Secondary | ICD-10-CM | POA: Diagnosis not present

## 2018-08-07 DIAGNOSIS — Z23 Encounter for immunization: Secondary | ICD-10-CM | POA: Diagnosis not present

## 2018-08-07 DIAGNOSIS — M25512 Pain in left shoulder: Secondary | ICD-10-CM | POA: Diagnosis not present

## 2018-08-16 ENCOUNTER — Other Ambulatory Visit: Payer: Self-pay | Admitting: Nurse Practitioner

## 2018-08-16 DIAGNOSIS — R1010 Upper abdominal pain, unspecified: Secondary | ICD-10-CM

## 2018-09-01 DIAGNOSIS — M25512 Pain in left shoulder: Secondary | ICD-10-CM | POA: Diagnosis not present

## 2018-09-07 ENCOUNTER — Ambulatory Visit: Payer: Self-pay | Admitting: Nurse Practitioner

## 2018-09-08 ENCOUNTER — Encounter: Payer: Self-pay | Admitting: Nurse Practitioner

## 2018-09-08 ENCOUNTER — Ambulatory Visit (INDEPENDENT_AMBULATORY_CARE_PROVIDER_SITE_OTHER): Payer: Medicare Other | Admitting: Nurse Practitioner

## 2018-09-08 VITALS — BP 118/70 | HR 78 | Temp 98.1°F | Ht 62.0 in | Wt 152.2 lb

## 2018-09-08 DIAGNOSIS — F329 Major depressive disorder, single episode, unspecified: Secondary | ICD-10-CM

## 2018-09-08 DIAGNOSIS — I10 Essential (primary) hypertension: Secondary | ICD-10-CM

## 2018-09-08 DIAGNOSIS — F32A Depression, unspecified: Secondary | ICD-10-CM

## 2018-09-08 DIAGNOSIS — F0390 Unspecified dementia without behavioral disturbance: Secondary | ICD-10-CM | POA: Diagnosis not present

## 2018-09-08 MED ORDER — MEMANTINE HCL-DONEPEZIL HCL 28-10 MG PO CP24
1.0000 | ORAL_CAPSULE | Freq: Every day | ORAL | 1 refills | Status: DC
Start: 2018-09-08 — End: 2018-12-21

## 2018-09-08 MED ORDER — CITALOPRAM HYDROBROMIDE 10 MG PO TABS: 10.0000 mg | ORAL_TABLET | Freq: Every day | ORAL | 2 refills | Status: DC

## 2018-09-08 NOTE — Progress Notes (Signed)
  Subjective:     Patient ID: Patricia Simmons , female    DOB: [birth year] , 82 y.o.   MRN: [id number]   Chief Complaint  Patient presents with  . Hypertension    HPI  Her daughter is attempting to place her in an assisted living facility in [place] - [address]  She is continent of urine and bowel.  Supervision with dressing.  She has no desire to cook or clean anymore.  No recent falls.  Uses cane for ambulation.   Hypertension  This is a chronic problem. The current episode started more than 1 year ago. The problem is unchanged. The problem is controlled. Pertinent negatives include no chest pain, headaches or malaise/fatigue. There are no compliance problems.      Past Medical History:  Diagnosis Date  . Arthritis   . Blood transfusion without reported diagnosis   . Hypertension      No family history on file.   Current Outpatient Medications:  .  acetaminophen (TYLENOL) 500 MG tablet, Take 1,000 mg by mouth every 6 (six) hours as needed for mild pain or headache., Disp: , Rfl:  .  hydrochlorothiazide (HYDRODIURIL) 12.5 MG tablet, Take 12.5 mg by mouth daily., Disp: , Rfl: 0 .  losartan (COZAAR) 50 MG tablet, Take 50 mg by mouth daily., Disp: , Rfl: 0 .  omeprazole (PRILOSEC) 40 MG capsule, Take 40 mg by mouth daily., Disp: , Rfl: 0   Allergies  Allergen Reactions  . Codeine     unknown     Review of Systems  Constitutional: Negative.  Negative for malaise/fatigue.  Respiratory: Negative.   Cardiovascular: Negative.  Negative for chest pain.  Gastrointestinal: Negative.   Genitourinary: Negative.   Musculoskeletal: Negative.   Neurological: Negative.  Negative for weakness and headaches.     Today's Vitals   09/08/18 1122  BP: 118/70  Pulse: 78  Temp: 98.1 F (36.7 C)  TempSrc: Oral  SpO2: 96%  Weight: 152 lb 3.2 oz (69 kg)  Height: 5\' 2"  (1.575 m)  PainSc: 0-No pain   Body mass index is 27.84 kg/m.   Objective:  Physical Exam   Constitutional: She is oriented to person, place, and time. She appears well-developed and well-nourished.  Cardiovascular: Normal rate, regular rhythm and normal heart sounds.  Pulmonary/Chest: Effort normal and breath sounds normal.  Neurological: She is alert and oriented to person, place, and time.  Skin: Skin is warm and dry.        Assessment And Plan:     1. Essential hypertension  Chronic, controlled  Continue with current medications  2. Dementia without behavioral disturbance, unspecified dementia type (HCC)  Chronic, stable  Continue with current medications  - Memantine HCl-Donepezil HCl (NAMZARIC) 28-10 MG CP24; Take 1 capsule by mouth daily.  Dispense: 90 capsule; Refill: 1  3. Depression, unspecified depression type  Depression score up to 20, was 14 last month and was 10 in June.  This may be causing her Demential to be worse  Will treat with citalopram  Her daughter is also planning to admit her to the [facility] at [place], [facility] form completed.    Her depression may improve once she is there she has a friend who lives there as well. - citalopram (CELEXA) 10 MG tablet; Take 1 tablet (10 mg total) by mouth daily.  Dispense: 30 tablet; Refill: 2        Arnette Felts, FNP

## 2018-10-11 ENCOUNTER — Other Ambulatory Visit: Payer: Self-pay

## 2018-10-11 NOTE — Patient Outreach (Signed)
[facility] Care Management  10/11/2018  Patricia Simmons 09-05-1936 [id number]   Medication Adherence call to [name] patient's telephone number is disconnected patient is due on Losartan 50 mg.[name] is showing past due under [employer].   Lillia AbedAna Ollison-Moran CPhT Pharmacy Technician [facility] Care Management Direct Dial [phone]  Fax [phone] [email or web address]

## 2018-10-13 ENCOUNTER — Ambulatory Visit: Payer: Medicare Other | Admitting: Nurse Practitioner

## 2018-11-02 ENCOUNTER — Other Ambulatory Visit: Payer: Self-pay | Admitting: Nurse Practitioner

## 2018-12-08 DIAGNOSIS — H40023 Open angle with borderline findings, high risk, bilateral: Secondary | ICD-10-CM | POA: Diagnosis not present

## 2018-12-12 DIAGNOSIS — H40023 Open angle with borderline findings, high risk, bilateral: Secondary | ICD-10-CM | POA: Diagnosis not present

## 2018-12-21 ENCOUNTER — Encounter: Payer: Self-pay | Admitting: Nurse Practitioner

## 2018-12-21 ENCOUNTER — Ambulatory Visit (INDEPENDENT_AMBULATORY_CARE_PROVIDER_SITE_OTHER): Payer: Medicare Other | Admitting: Nurse Practitioner

## 2018-12-21 VITALS — BP 132/88 | HR 86 | Temp 98.8°F | Ht 63.4 in | Wt 159.8 lb

## 2018-12-21 DIAGNOSIS — K219 Gastro-esophageal reflux disease without esophagitis: Secondary | ICD-10-CM

## 2018-12-21 DIAGNOSIS — F0390 Unspecified dementia without behavioral disturbance: Secondary | ICD-10-CM | POA: Diagnosis not present

## 2018-12-21 DIAGNOSIS — I10 Essential (primary) hypertension: Secondary | ICD-10-CM | POA: Diagnosis not present

## 2018-12-21 DIAGNOSIS — L910 Hypertrophic scar: Secondary | ICD-10-CM

## 2018-12-21 DIAGNOSIS — F329 Major depressive disorder, single episode, unspecified: Secondary | ICD-10-CM

## 2018-12-21 DIAGNOSIS — F32A Depression, unspecified: Secondary | ICD-10-CM

## 2018-12-21 MED ORDER — LOSARTAN POTASSIUM 50 MG PO TABS: 50.0000 mg | ORAL_TABLET | Freq: Every day | ORAL | 1 refills | Status: DC

## 2018-12-21 MED ORDER — TRIAMCINOLONE ACETONIDE 0.1 % EX CREA: 1.0000 "application " | TOPICAL_CREAM | Freq: Two times a day (BID) | CUTANEOUS | 0 refills | Status: DC

## 2018-12-21 MED ORDER — HYDROCHLOROTHIAZIDE 12.5 MG PO TABS: 12.5000 mg | ORAL_TABLET | Freq: Every day | ORAL | 1 refills | Status: DC

## 2018-12-21 MED ORDER — OMEPRAZOLE 40 MG PO CPDR: 40.0000 mg | DELAYED_RELEASE_CAPSULE | Freq: Every day | ORAL | 1 refills | Status: DC

## 2018-12-21 MED ORDER — CITALOPRAM HYDROBROMIDE 10 MG PO TABS: 10.0000 mg | ORAL_TABLET | Freq: Every day | ORAL | 1 refills | Status: DC

## 2018-12-21 NOTE — Progress Notes (Signed)
Subjective:     Patient ID: Patricia Simmons , female    DOB: 01/26/1936 , 83 y.o.   MRN: [id number]   Chief Complaint  Patient presents with  . Dementia f/u    Patient's daughter stated the namzaric and it is not working for her    HPI  Memory loss - she is now living with her daughter.  Her daughter feels the Namzaric was making her worse.  She has been without the Namzaric for at least one month.  She is having difficulty with word finding and she becomes frustrated.  Eating well.  She does eat fish.      Past Medical History:  Diagnosis Date  . Arthritis   . Blood transfusion without reported diagnosis   . Hypertension      No family history on file.   Current Outpatient Medications:  .  acetaminophen (TYLENOL) 500 MG tablet, Take 1,000 mg by mouth every 6 (six) hours as needed for mild pain or headache., Disp: , Rfl:  .  citalopram (CELEXA) 10 MG tablet, Take 1 tablet (10 mg total) by mouth daily., Disp: 30 tablet, Rfl: 2 .  hydrochlorothiazide (HYDRODIURIL) 12.5 MG tablet, TAKE 1 TABLET BY MOUTH ONCE DAILY, Disp: 90 tablet, Rfl: 0 .  losartan (COZAAR) 50 MG tablet, Take 50 mg by mouth daily., Disp: , Rfl: 0 .  Memantine HCl-Donepezil HCl (NAMZARIC) 28-10 MG CP24, Take 1 capsule by mouth daily., Disp: 90 capsule, Rfl: 1 .  omeprazole (PRILOSEC) 40 MG capsule, Take 40 mg by mouth daily., Disp: , Rfl: 0   Allergies  Allergen Reactions  . Codeine     unknown     Review of Systems  Skin:       Keloids present to chest area.       Today's Vitals   12/21/18 1058  BP: 132/88  Pulse: 86  Temp: 98.8 F (37.1 C)  TempSrc: Oral  Weight: 159 lb 12.8 oz (72.5 kg)  Height: 5' 3.4" (1.61 m)  PainSc: 0-No pain   Body mass index is 27.95 kg/m.   Objective:  Physical Exam Vitals signs reviewed.  Constitutional:      Appearance: Normal appearance. She is well-developed.  HENT:     Head: Normocephalic and atraumatic.  Cardiovascular:     Rate and Rhythm: Normal rate  and regular rhythm.     Pulses: Normal pulses.     Heart sounds: Normal heart sounds. No murmur.  Pulmonary:     Effort: Pulmonary effort is normal. No respiratory distress.     Breath sounds: Normal breath sounds.  Musculoskeletal: Normal range of motion.        General: No swelling.  Skin:    General: Skin is warm and dry.     Capillary Refill: Capillary refill takes less than 2 seconds.  Neurological:     General: No focal deficit present.     Mental Status: She is alert and oriented to person, place, and time. She is confused.     Cranial Nerves: No cranial nerve deficit.     Motor: No weakness.     Comments: Confused at times with questions related to her medications. She does not speak much during the visit.    Psychiatric:        Mood and Affect: Mood normal.         Assessment And Plan:     1. Depression, unspecified depression type  Daughter has not been giving the patient citalopram  reports has been unable to find the medication  Will start this time and return in 4-6 weeks for medication check. - citalopram (CELEXA) 10 MG tablet; Take 1 tablet (10 mg total) by mouth daily.  Dispense: 90 tablet; Refill: 1  2. Gastroesophageal reflux disease without esophagitis  Chronic, controlled  Continue with current medications - omeprazole (PRILOSEC) 40 MG capsule; Take 1 capsule (40 mg total) by mouth daily.  Dispense: 90 capsule; Refill: 1  3. Essential hypertension . B/P is controlled.  . The importance of regular exercise and dietary modification was stressed to the patient.  - hydrochlorothiazide (HYDRODIURIL) 12.5 MG tablet; Take 1 tablet (12.5 mg total) by mouth daily.  Dispense: 90 tablet; Refill: 1 - losartan (COZAAR) 50 MG tablet; Take 1 tablet (50 mg total) by mouth daily.  Dispense: 90 tablet; Refill: 1  4. Keloid  Has keloid areas to chest area  Her sister is scheduling her for an appt with dermatology and the daughter is to get the name so I can send a  referral  Will provide steroid cream to see if helps with swelling and itching - triamcinolone cream (KENALOG) 0.1 %; Apply 1 application topically 2 (two) times daily.  Dispense: 30 g; Refill: 0  5. Dementia without behavioral disturbance, unspecified dementia type (HCC)  Chronic, daughter does not feel the Namzaric was effective  At this time will hold off and try the antidepressant  If no improvement discussed referring to Neurology for consult.  Also recommended the book 36 hour day for caregivers.  She is now living with her daughter.      Arnette FeltsJanece Karinna Beadles, FNP

## 2018-12-21 NOTE — Patient Instructions (Signed)
GET THE BOOK 36 HOUR DAY WRITTEN BY A PHYSICIAN TO HELP UNDERSTAND THE DISEASE PROCESS OF MEMORY LOSS.  CHECK WITH THE LOCAL BOOK STORE OR CHECK AT THE LIBRARY    Dementia Caregiver Guide Dementia is a term used to describe a number of symptoms that affect memory and thinking. The most common symptoms include:  Memory loss.  Trouble with language and communication.  Trouble concentrating.  Poor judgment.  Problems with reasoning.  Child-like behavior and language.  Extreme anxiety.  Angry outbursts.  Wandering from home or public places. Dementia usually gets worse slowly over time. In the early stages, people with dementia can stay independent and safe with some help. In later stages, they need help with daily tasks such as dressing, grooming, and using the bathroom. How to help the person with dementia cope Dementia can be frightening and confusing. Here are some tips to help the person with dementia cope with changes caused by the disease. General tips  Keep the person on track with his or her routine.  Try to identify areas where the person may need help.  Be supportive, patient, calm, and encouraging.  Gently remind the person that adjusting to changes takes time.  Help with the tasks that the person has asked for help with.  Keep the person involved in daily tasks and decisions as much as possible.  Encourage conversation, but try not to get frustrated or harried if the person struggles to find words or does not seem to appreciate your help. Communication tips  When the person is talking or seems frustrated, make eye contact and hold the person's hand.  Ask specific questions that need yes or no answers.  Use simple words, short sentences, and a calm voice. Only give one direction at a time.  When offering choices, limit them to just 1 or 2.  Avoid correcting the person in a negative way.  If the person is struggling to find the right words, gently try to  help him or her. How to recognize symptoms of stress Symptoms of stress in caregivers include:  Feeling frustrated or angry with the person with dementia.  Denying that the person has dementia or that his or her symptoms will not improve.  Feeling hopeless and unappreciated.  Difficulty sleeping.  Difficulty concentrating.  Feeling anxious, irritable, or depressed.  Developing stress-related health problems.  Feeling like you have too little time for your own life. Follow these instructions at home:   Make sure that you and the person you are caring for: ? Get regular sleep. ? Exercise regularly. ? Eat regular, nutritious meals. ? Drink enough fluid to keep your urine clear or pale yellow. ? Take over-the-counter and prescription medicines only as told by your health care providers. ? Attend all scheduled health care appointments.  Join a support group with others who are caregivers.  Ask about respite care resources so that you can have a regular break from the stress of caregiving.  Look for signs of stress in yourself and in the person you are caring for. If you notice signs of stress, take steps to manage it.  Consider any safety risks and take steps to avoid them.  Organize medications in a pill box for each day of the week.  Create a plan to handle any legal or financial matters. Get legal or financial advice if needed.  Keep a calendar in a central location to remind the person of appointments or other activities. Tips for reducing the  risk of injury  Keep floors clear of clutter. Remove rugs, magazine racks, and floor lamps.  Keep hallways well lit, especially at night.  Put a handrail and nonslip mat in the bathtub or shower.  Put childproof locks on cabinets that contain dangerous items, such as medicines, alcohol, guns, toxic cleaning items, sharp tools or utensils, matches, and lighters.  Put the locks in places where the person cannot see or reach  them easily. This will help ensure that the person does not wander out of the house and get lost.  Be prepared for emergencies. Keep a list of emergency phone numbers and addresses in a convenient area.  Remove car keys and lock garage doors so that the person does not try to get in the car and drive.  Have the person wear a bracelet that tracks locations and identifies the person as having memory problems. This should be worn at all times for safety. Where to find support: Many individuals and organizations offer support. These include:  Support groups for people with dementia and for caregivers.  Counselors or therapists.  Home health care services.  Adult day care centers. Where to find more information Alzheimer's Association: LimitLaws.hu Contact a health care provider if:  The person's health is rapidly getting worse.  You are no longer able to care for the person.  Caring for the person is affecting your physical and emotional health.  The person threatens himself or herself, you, or anyone else. Summary  Dementia is a term used to describe a number of symptoms that affect memory and thinking.  Dementia usually gets worse slowly over time.  Take steps to reduce the person's risk of injury, and to plan for future care.  Caregivers need support, relief from caregiving, and time for their own lives. This information is not intended to replace advice given to you by your health care provider. Make sure you discuss any questions you have with your health care provider. Document Released: 09/28/2016 Document Revised: 09/28/2016 Document Reviewed: 09/28/2016 Elsevier Interactive Patient Education  2019 [employer].

## 2019-01-02 DIAGNOSIS — K130 Diseases of lips: Secondary | ICD-10-CM | POA: Diagnosis not present

## 2019-01-09 ENCOUNTER — Emergency Department
Admission: EM | Admit: 2019-01-09 | Discharge: 2019-01-09 | Disposition: A | Discharge status: Home / Self Care | Payer: Medicare Other | Attending: Emergency Medicine | Admitting: Emergency Medicine

## 2019-01-09 ENCOUNTER — Encounter: Payer: Self-pay | Admitting: Emergency Medicine

## 2019-01-09 ENCOUNTER — Emergency Department: Payer: Medicare Other

## 2019-01-09 ENCOUNTER — Other Ambulatory Visit: Payer: Self-pay

## 2019-01-09 DIAGNOSIS — R519 Headache, unspecified: Secondary | ICD-10-CM

## 2019-01-09 DIAGNOSIS — I10 Essential (primary) hypertension: Secondary | ICD-10-CM | POA: Diagnosis not present

## 2019-01-09 DIAGNOSIS — F329 Major depressive disorder, single episode, unspecified: Secondary | ICD-10-CM | POA: Insufficient documentation

## 2019-01-09 DIAGNOSIS — R42 Dizziness and giddiness: Secondary | ICD-10-CM | POA: Diagnosis not present

## 2019-01-09 DIAGNOSIS — F0390 Unspecified dementia without behavioral disturbance: Secondary | ICD-10-CM | POA: Diagnosis not present

## 2019-01-09 DIAGNOSIS — R51 Headache: Secondary | ICD-10-CM | POA: Insufficient documentation

## 2019-01-09 LAB — CBC
HCT: 40.5 % (ref 36.0–46.0)
HEMOGLOBIN: 13.5 g/dL (ref 12.0–15.0)
MCH: 30.8 pg (ref 26.0–34.0)
MCHC: 33.3 g/dL (ref 30.0–36.0)
MCV: 92.3 fL (ref 80.0–100.0)
Platelets: 224 10*3/uL (ref 150–400)
RBC: 4.39 MIL/uL (ref 3.87–5.11)
RDW: 12.5 % (ref 11.5–15.5)
WBC: 4.6 10*3/uL (ref 4.0–10.5)
nRBC: 0 % (ref 0.0–0.2)

## 2019-01-09 LAB — COMPREHENSIVE METABOLIC PANEL
ALK PHOS: 53 U/L (ref 38–126)
ALT: 12 U/L (ref 0–44)
ANION GAP: 8 (ref 5–15)
AST: 20 U/L (ref 15–41)
Albumin: 4.2 g/dL (ref 3.5–5.0)
BUN: 9 mg/dL (ref 8–23)
CALCIUM: 9.2 mg/dL (ref 8.9–10.3)
CO2: 28 mmol/L (ref 22–32)
Chloride: 103 mmol/L (ref 98–111)
Creatinine, Ser: 0.7 mg/dL (ref 0.44–1.00)
Glucose, Bld: 115 mg/dL — ABNORMAL HIGH (ref 70–99)
Potassium: 3.4 mmol/L — ABNORMAL LOW (ref 3.5–5.1)
Sodium: 139 mmol/L (ref 135–145)
TOTAL PROTEIN: 7.1 g/dL (ref 6.5–8.1)
Total Bilirubin: 1.1 mg/dL (ref 0.3–1.2)

## 2019-01-09 LAB — URINALYSIS, ROUTINE W REFLEX MICROSCOPIC
BILIRUBIN URINE: NEGATIVE
Glucose, UA: NEGATIVE mg/dL
HGB URINE DIPSTICK: NEGATIVE
KETONES UR: NEGATIVE mg/dL
Leukocytes,Ua: NEGATIVE
Nitrite: NEGATIVE
PROTEIN: NEGATIVE mg/dL
Specific Gravity, Urine: 1.011 (ref 1.005–1.030)
pH: 8 (ref 5.0–8.0)

## 2019-01-09 LAB — TROPONIN I

## 2019-01-09 MED ORDER — IOHEXOL 350 MG/ML SOLN
75.0000 mL | Freq: Once | INTRAVENOUS | Status: AC | PRN
  Administered 2019-01-09: 75 mL via INTRAVENOUS

## 2019-01-09 MED ORDER — SODIUM CHLORIDE 0.9 % IV BOLUS
500.0000 mL | Freq: Once | INTRAVENOUS | Status: AC
  Administered 2019-01-09: 500 mL via INTRAVENOUS

## 2019-01-09 NOTE — ED Notes (Signed)
Pt and family verbalize d/c understanding , NAD noted, VSS. PT unable to sign due to signature pad malfnx

## 2019-01-09 NOTE — ED Notes (Signed)
Orthostatic vitals:  Laying 138/83 (97)  Sitting 139/88 (104)  Standing 2 minutes 115/93 (100)  Standing 5 minutes 119/98 (105)

## 2019-01-09 NOTE — ED Triage Notes (Signed)
Patient ambulatory to triage with steady gait, without difficulty or distress noted accomp by daughter; when asked why she is here, pt pointing to her abdomen; when asked if she is in pain st no, when asked if she is nauseated, st just "not feeling well", difficulty expressing exactly why she is here, poor historian, and daughter unable to assist with questions; pt then reports nonprod cough and sinus drainage since yesterday

## 2019-01-09 NOTE — ED Provider Notes (Addendum)
[facility] Emergency Department Provider Note  ____________________________________________  Time seen: Approximately 7:24 AM  I have reviewed the triage vital signs and the nursing notes.   HISTORY  Chief Complaint Cough    HPI Patricia Simmons is a 83 y.o. female with a history of advanced dementia, HTN, who lives with her daughter, presenting for head pressure and dizziness.  The patient is a poor historian, so obtaining a history and doing a physical exam is difficult.  The patient and her daughter report that this morning she had a "head pressure," mostly in the forehead associated with a room spinning sensation.  She did not have any changes in vision, numbness tingling or weakness, changes in speech or mental status, or difficulty with walking.  She did not take any medication but at this time her symptoms have completely resolved.  She has been doing well over the last few days without any cough or cold symptoms, nausea vomiting or diarrhea, dysuria, fevers or chills.  Her daughter does note that she was reporting the same symptoms last week and that she intermittently has had the symptoms every few weeks over the last several years.  The reason the patient was brought today was that she told her daughter "she was not going to make it anymore."  Past Medical History:  Diagnosis Date  . Arthritis   . Blood transfusion without reported diagnosis   . Hypertension     Patient Active Problem List   Diagnosis Date Noted  . Keloid 12/21/2018  . Dementia without behavioral disturbance (HCC) 09/08/2018  . Depression 09/08/2018  . Hypokalemia 07/30/2018  . Chest pain 07/30/2018  . GERD (gastroesophageal reflux disease) 07/30/2018  . Essential hypertension 07/30/2018    Past Surgical History:  Procedure Laterality Date  . ABDOMINAL HYSTERECTOMY    . HEMORROIDECTOMY    . TUBAL LIGATION      Current Outpatient Rx  . Order #: [id number] Class: Historical Med   . Order #: [id number] Class: Normal  . Order #: [id number] Class: Normal  . Order #: [id number] Class: Normal  . Order #: [id number] Class: Normal  . Order #: [id number] Class: Normal    Allergies Codeine  No family history on file.  Social History Social History   Tobacco Use  . Smoking status: Never Smoker  . Smokeless tobacco: Never Used  Substance Use Topics  . Alcohol use: No  . Drug use: No    Review of Systems Constitutional: No fever/chills.  No lightheadedness or syncope.  Positive dizziness.  No trauma or falls. Eyes: No visual changes.  No blurred or double vision. ENT: No sore throat. No congestion or rhinorrhea. Cardiovascular: Denies chest pain. Denies palpitations. Respiratory: Denies shortness of breath.  No cough. Gastrointestinal: No abdominal pain.  No nausea, no vomiting.  No diarrhea.  No constipation. Genitourinary: Negative for dysuria.  No urinary frequency. Musculoskeletal: Negative for back pain. Skin: Negative for rash. Neurological: Positive for frontal headaches, ongoing for years. No focal numbness, tingling or weakness.  No difficulty walking.  Positive dizziness.  No changes in vision, speech or mental status.    ____________________________________________   PHYSICAL EXAM:  VITAL SIGNS: ED Triage Vitals  Enc Vitals Group     BP 01/09/19 0658 139/87     Pulse Rate 01/09/19 0658 76     Resp 01/09/19 0658 18     Temp 01/09/19 0658 98.2 F (36.8 C)     Temp Source 01/09/19 0658 Oral     SpO2 01/09/19 0658 100 %  Weight 01/09/19 0653 150 lb (68 kg)     Height 01/09/19 0653 5\' 5"  (1.651 m)     Head Circumference --      Peak Flow --      Pain Score 01/09/19 0654 0     Pain Loc --      Pain Edu? --      Excl. in GC? --     Constitutional: Alert and disoriented.  The patient answer some questions appropriately but occasionally becomes confused.  According to her daughter this is baseline.  GCS is 15.   Eyes: Conjunctivae are normal.   EOMI. PERRLA.  No scleral icterus. Head: Atraumatic.  No tenderness to palpation over the temples bilaterally. Nose: No congestion/rhinnorhea. Mouth/Throat: Mucous membranes are moist.  Neck: No stridor.  Supple.  No JVD.  No meningismus. Cardiovascular: Normal rate, regular rhythm. No murmurs, rubs or gallops.  Respiratory: Normal respiratory effort.  No accessory muscle use or retractions. Lungs CTAB.  No wheezes, rales or ronchi. Gastrointestinal: Soft, nontender and nondistended.  No guarding or rebound.  No peritoneal signs. Musculoskeletal: No LE edema. No ttp in the calves or palpable cords.  Negative Homan's sign. Neurologic: Alert and oriented 2. Speech is clear.  Face and smile symmetric. Tongue is midline.  EOMI.  PERRLA.  No horizontal or vertical nystagmus.  Baseline resting tremor, most notable in the upper extremities bilaterally.  No pronator drift. 5 out of 5 grip, biceps, triceps, hip flexors, plantar flexion and dorsiflexion. Normal sensation to light touch in the bilateral upper and lower extremities, and face. Normal finger-nose-finger with tremor but no ataxia. Skin:  Skin is warm, dry and intact. No rash noted. Psychiatric: Mood and affect are normal.   ____________________________________________   LABS (all labs ordered are listed, but only abnormal results are displayed)  Labs Reviewed  CBC  COMPREHENSIVE METABOLIC PANEL  TROPONIN I   ____________________________________________  EKG  ED ECG REPORT I, Anne-Caroline Sharma Covert, the attending physician, personally viewed and interpreted this ECG.   Date: 01/09/2019  EKG Time: 759  Rate: 67  Rhythm: normal sinus rhythm  Axis: normal  Intervals:none  ST&T Change: No STEMI  ____________________________________________  RADIOLOGY  Dg Chest 2 View  Result Date: 01/09/2019 CLINICAL DATA:  Dizziness EXAM: CHEST - 2 VIEW COMPARISON:  None. FINDINGS: Normal heart size. Aortic tortuosity. Mild atelectasis or  scarring at the left base. There is no edema, consolidation, effusion, or pneumothorax. IMPRESSION: No evidence of acute disease. Electronically Signed   By: Marnee Spring M.D.   On: 01/09/2019 07:56    ____________________________________________   PROCEDURES  Procedure(s) performed: None  Procedures  Critical Care performed: No ____________________________________________   INITIAL IMPRESSION / ASSESSMENT AND PLAN / ED COURSE  Pertinent labs & imaging results that were available during my care of the patient were reviewed by me and considered in my medical decision making (see chart for details).  83 y.o. female with advanced dementia presenting with headache and dizziness, episodic over the last several years.  Overall, the patient is hemodynamically stable.  Her symptoms are fairly nonspecific and her examination does not show any focal neurologic deficits.  However, given her age and risk factors, will get a CT as well as CT angiogram of the head and neck to rule out any stroke or large vessel disease.  In addition we will check a urinalysis to rule out UTI.  We will get basic laboratory studies to rule out anemia or electrolyte abnormality.  She has  no pain over the temple or visual changes so temporal arteritis is very unlikely.  I will obtain EKG, as may be the patient has transient arrhythmia, but again this is less likely.  I do not have her vital signs for this morning when she was symptomatic; I wonder if she may be experiencing hypertension or glycemic abnormality when she awakes and have asked her daughter to take her vital signs and blood sugar the next time she has an episode.  Plan reevaluation for final disposition.  ----------------------------------------- 9:34 AM on 01/09/2019 -----------------------------------------  The patient's laboratory studies are reassuring.  Her lead counts are within normal limits, her white blood cell count is normal, her electrolytes are  reassuring, and her troponin is negative.  Her chest x-ray does not show any acute cardiopulmonary process.  I am awaiting the results of her CT angiogram and her urinalysis.  The patient continues to be asymptomatic and hemodynamically stable at this time.  Anticipate discharge home. ----------------------------------------- 11:17 AM on 01/09/2019 -----------------------------------------  The patient's work-up in the emergency department has been reassuring.  Her CT angiogram does not show any acute abnormalities.  At this time, the patient will be discharged home.  Follow-up instructions as well as return precautions were discussed.  ____________________________________________  FINAL CLINICAL IMPRESSION(S) / ED DIAGNOSES  Final diagnoses:  Nonintractable episodic headache, unspecified headache type  Dizziness         NEW MEDICATIONS STARTED DURING THIS VISIT:  New Prescriptions   No medications on file      Rockne MenghiniNorman, Anne-Caroline, MD 01/09/19 [id number]    Rockne MenghiniNorman, Anne-Caroline, MD 01/09/19 [id number]    Rockne MenghiniNorman, Anne-Caroline, MD 01/09/19 [phone]

## 2019-01-09 NOTE — ED Notes (Signed)
Patient transported to CT 

## 2019-01-09 NOTE — Discharge Instructions (Addendum)
Please monitor your mom for headache and dizziness.  If she does have the symptoms, please check her heart rate, blood pressure, and her blood sugar.  Please keep a record of it to bring with you to your primary care physician's office.  Return to the emergency department if you develop severe pain, lightheadedness or fainting, fever, numbness tingling or weakness, difficulty walking, changes in vision, speech or mental status, or any other symptoms concerning to you.

## 2019-01-18 ENCOUNTER — Ambulatory Visit (INDEPENDENT_AMBULATORY_CARE_PROVIDER_SITE_OTHER): Payer: Medicare Other | Admitting: Nurse Practitioner

## 2019-01-18 ENCOUNTER — Encounter: Payer: Self-pay | Admitting: Nurse Practitioner

## 2019-01-18 ENCOUNTER — Other Ambulatory Visit: Payer: Self-pay

## 2019-01-18 VITALS — BP 142/100 | HR 96 | Temp 98.4°F | Wt 160.0 lb

## 2019-01-18 DIAGNOSIS — R519 Headache, unspecified: Secondary | ICD-10-CM

## 2019-01-18 DIAGNOSIS — I10 Essential (primary) hypertension: Secondary | ICD-10-CM | POA: Diagnosis not present

## 2019-01-18 DIAGNOSIS — Z09 Encounter for follow-up examination after completed treatment for conditions other than malignant neoplasm: Secondary | ICD-10-CM

## 2019-01-18 DIAGNOSIS — R1084 Generalized abdominal pain: Secondary | ICD-10-CM | POA: Diagnosis not present

## 2019-01-18 DIAGNOSIS — R51 Headache: Secondary | ICD-10-CM | POA: Diagnosis not present

## 2019-01-18 DIAGNOSIS — G8929 Other chronic pain: Secondary | ICD-10-CM

## 2019-01-18 MED ORDER — KETOROLAC TROMETHAMINE 60 MG/2ML IM SOLN: 60.0000 mg | Freq: Once | INTRAMUSCULAR | Status: DC

## 2019-01-18 MED ORDER — HYDROCHLOROTHIAZIDE 12.5 MG PO TABS: 25.0000 mg | ORAL_TABLET | Freq: Every day | ORAL | 1 refills | Status: DC

## 2019-01-18 MED ORDER — KETOROLAC TROMETHAMINE 30 MG/ML IJ SOLN
30.0000 mg | Freq: Once | INTRAMUSCULAR | Status: AC
  Administered 2019-01-18: 30 mg via INTRAMUSCULAR

## 2019-01-18 NOTE — Progress Notes (Signed)
Subjective:     Patient ID: Patricia Simmons , female    DOB: Jan 14, 1936 , 83 y.o.   MRN: [id number]   Chief Complaint  Patient presents with  . er follow up    headache, stomache, pain on left of head,     HPI  Her sister is reporting she had head pain holding her head every morning.  She feels it has been months.  Complains about head pain all the time.  Will take 2 tylenol.    She and her sister are poor historian.  She has been upset about going to a nursing home in [place].  Her daughter is setting up her pill box.  Daughter has been driving her mother to her sister's house.    Headache   This is a chronic problem. The current episode started more than 1 year ago. The pain does not radiate. The quality of the pain is described as aching. Associated symptoms include abdominal pain (low abdominal pain) and dizziness. Pertinent negatives include no coughing, nausea, photophobia or weakness. There is no history of cancer or hypertension.  Abdominal Pain  Associated symptoms include headaches. Pertinent negatives include no diarrhea or nausea.     Past Medical History:  Diagnosis Date  . Arthritis   . Blood transfusion without reported diagnosis   . Hypertension      Family History  Problem Relation Age of Onset  . Stroke Mother   . Hypertension Mother   . Prostate cancer Father   . Hypertension Sister   . Hyperlipidemia Sister   . Emphysema Brother   . Lung cancer Brother      Current Outpatient Medications:  .  acetaminophen (TYLENOL) 500 MG tablet, Take 1,000 mg by mouth every 6 (six) hours as needed for mild pain or headache., Disp: , Rfl:  .  citalopram (CELEXA) 10 MG tablet, Take 1 tablet (10 mg total) by mouth daily., Disp: 90 tablet, Rfl: 1 .  hydrochlorothiazide (HYDRODIURIL) 12.5 MG tablet, Take 2 tablets (25 mg total) by mouth daily., Disp: 90 tablet, Rfl: 1 .  losartan (COZAAR) 50 MG tablet, Take 1 tablet (50 mg total) by mouth daily., Disp: 90 tablet, Rfl:  1 .  omeprazole (PRILOSEC) 40 MG capsule, Take 1 capsule (40 mg total) by mouth daily., Disp: 90 capsule, Rfl: 1 .  triamcinolone cream (KENALOG) 0.1 %, Apply 1 application topically 2 (two) times daily., Disp: 30 g, Rfl: 0  Current Facility-Administered Medications:  .  ketorolac (TORADOL) 30 MG/ML injection 30 mg, 30 mg, Intramuscular, Once, Arnette Felts, FNP   Allergies  Allergen Reactions  . Codeine     unknown     Review of Systems  Constitutional: Negative for fatigue.  Eyes: Negative for photophobia and visual disturbance.  Respiratory: Negative for cough.   Cardiovascular: Negative for chest pain, palpitations and leg swelling.  Gastrointestinal: Positive for abdominal pain (low abdominal pain). Negative for diarrhea and nausea.  Skin: Negative.   Neurological: Positive for dizziness and headaches. Negative for syncope and weakness.     Today's Vitals   01/18/19 1018  BP: (!) 142/100  Pulse: 96  Temp: 98.4 F (36.9 C)  TempSrc: Oral  SpO2: 96%  Weight: 160 lb (72.6 kg)   Body mass index is 26.63 kg/m.   Objective:  Physical Exam Vitals signs reviewed.  Constitutional:      Appearance: Normal appearance. She is well-developed.  HENT:     Head: Normocephalic and atraumatic.  Cardiovascular:  Rate and Rhythm: Normal rate and regular rhythm.     Pulses: Normal pulses.     Heart sounds: Normal heart sounds. No murmur.  Pulmonary:     Effort: Pulmonary effort is normal. No respiratory distress.     Breath sounds: Normal breath sounds. No wheezing or rales.  Musculoskeletal: Normal range of motion.        General: No swelling.  Skin:    General: Skin is warm and dry.     Capillary Refill: Capillary refill takes less than 2 seconds.  Neurological:     General: No focal deficit present.     Mental Status: She is alert and oriented to person, place, and time. She is confused.     Cranial Nerves: No cranial nerve deficit.     Motor: No weakness.      Comments: Confused at times with questions related to her medications. She does not speak much during the visit.    Psychiatric:        Mood and Affect: Mood normal.        Behavior: Behavior normal.        Thought Content: Thought content normal.        Judgment: Judgment normal.         Assessment And Plan:     1. Chronic intractable headache, unspecified headache type  Persistent headache, will check  - CT Head Wo Contrast; Future - ketorolac (TORADOL) 30 MG/ML injection 30 mg  2. Essential hypertension . B/P is poorly controlled. . She is having pain in her head . I will add HCTZ to her blood pressure regimen . The importance of regular exercise and dietary modification was stressed to the patient.  . Stressed importance of losing ten percent of her body weight to help with B/P control.  . The weight loss would help with decreasing cardiac and cancer risk as well.  - hydrochlorothiazide (HYDRODIURIL) 12.5 MG tablet; Take 2 tablets (25 mg total) by mouth daily.  Dispense: 90 tablet; Refill: 1  3. Generalized abdominal pain  Mild tenderness to generalized area of abdomen her discomfort has been ongoing will check abdominal ultrasound - DG Abd 1 View; Future      Arnette Felts, FNP

## 2019-01-19 ENCOUNTER — Ambulatory Visit
Admission: RE | Admit: 2019-01-19 | Discharge: 2019-01-19 | Disposition: A | Discharge status: Home / Self Care | Payer: Medicare Other | Source: Ambulatory Visit | Attending: Nurse Practitioner | Admitting: Nurse Practitioner

## 2019-01-19 DIAGNOSIS — R413 Other amnesia: Secondary | ICD-10-CM | POA: Diagnosis not present

## 2019-01-19 DIAGNOSIS — R519 Headache, unspecified: Secondary | ICD-10-CM

## 2019-01-19 DIAGNOSIS — R51 Headache: Principal | ICD-10-CM

## 2019-01-19 DIAGNOSIS — R109 Unspecified abdominal pain: Secondary | ICD-10-CM | POA: Diagnosis not present

## 2019-01-19 DIAGNOSIS — R1084 Generalized abdominal pain: Secondary | ICD-10-CM

## 2019-01-23 ENCOUNTER — Telehealth: Payer: Self-pay

## 2019-01-23 NOTE — Telephone Encounter (Signed)
I returned the pt's daughter's call to schedule the pt an appt for hip pain.

## 2019-01-24 ENCOUNTER — Ambulatory Visit (INDEPENDENT_AMBULATORY_CARE_PROVIDER_SITE_OTHER): Payer: Medicare Other | Admitting: Nurse Practitioner

## 2019-01-24 ENCOUNTER — Other Ambulatory Visit: Payer: Self-pay

## 2019-01-24 ENCOUNTER — Encounter: Payer: Self-pay | Admitting: Nurse Practitioner

## 2019-01-24 VITALS — BP 126/74 | HR 79 | Temp 97.7°F | Ht 65.0 in | Wt 158.8 lb

## 2019-01-24 DIAGNOSIS — M25552 Pain in left hip: Secondary | ICD-10-CM

## 2019-01-24 MED ORDER — TRIAMCINOLONE ACETONIDE 40 MG/ML IJ SUSP
40.0000 mg | Freq: Once | INTRAMUSCULAR | Status: AC
  Administered 2019-01-24: 40 mg via INTRAMUSCULAR

## 2019-01-24 NOTE — Progress Notes (Signed)
Subjective:     Patient ID: Patricia Simmons , female    DOB: 09-16-1936 , 83 y.o.   MRN: [id number]   Chief Complaint  Patient presents with  . Hip Pain    left hip    HPI  Hip Pain   The incident occurred 3 to 5 days ago. There was no injury mechanism (when she got up on Sunday complained of pain ). The pain is present in the left hip. Quality: sharp pain. The pain is at a severity of 9/10. Pertinent negatives include no inability to bear weight. The symptoms are aggravated by movement. She has tried acetaminophen (her daughter used icy hot) for the symptoms.     Past Medical History:  Diagnosis Date  . Arthritis   . Blood transfusion without reported diagnosis   . Hypertension      Family History  Problem Relation Age of Onset  . Stroke Mother   . Hypertension Mother   . Prostate cancer Father   . Hypertension Sister   . Hyperlipidemia Sister   . Emphysema Brother   . Lung cancer Brother      Current Outpatient Medications:  .  acetaminophen (TYLENOL) 500 MG tablet, Take 1,000 mg by mouth every 6 (six) hours as needed for mild pain or headache., Disp: , Rfl:  .  citalopram (CELEXA) 10 MG tablet, Take 1 tablet (10 mg total) by mouth daily., Disp: 90 tablet, Rfl: 1 .  hydrochlorothiazide (HYDRODIURIL) 12.5 MG tablet, Take 2 tablets (25 mg total) by mouth daily., Disp: 90 tablet, Rfl: 1 .  losartan (COZAAR) 50 MG tablet, Take 1 tablet (50 mg total) by mouth daily., Disp: 90 tablet, Rfl: 1 .  omeprazole (PRILOSEC) 40 MG capsule, Take 1 capsule (40 mg total) by mouth daily., Disp: 90 capsule, Rfl: 1 .  triamcinolone cream (KENALOG) 0.1 %, Apply 1 application topically 2 (two) times daily., Disp: 30 g, Rfl: 0   Allergies  Allergen Reactions  . Codeine     unknown     Review of Systems  Constitutional: Negative for fatigue.  Respiratory: Negative.   Cardiovascular: Negative.  Negative for chest pain, palpitations and leg swelling.  Musculoskeletal: Positive for  arthralgias (left hip pain).  Neurological: Negative for dizziness and headaches.     Today's Vitals   01/24/19 1413  BP: 126/74  Pulse: 79  Temp: 97.7 F (36.5 C)  TempSrc: Oral  Weight: 158 lb 12.8 oz (72 kg)  Height: 5\' 5"  (1.651 m)  PainSc: 9   PainLoc: Hip   Body mass index is 26.43 kg/m.   Objective:  Physical Exam Constitutional:      Appearance: Normal appearance.  Cardiovascular:     Rate and Rhythm: Normal rate and regular rhythm.     Pulses: Normal pulses.     Heart sounds: Normal heart sounds. No murmur.  Musculoskeletal:        General: Tenderness (left hip) present.     Left hip: She exhibits tenderness. She exhibits normal range of motion, no swelling, no crepitus and no deformity.  Neurological:     General: No focal deficit present.     Mental Status: She is alert. Mental status is at baseline.  Psychiatric:        Mood and Affect: Mood normal.        Behavior: Behavior normal.        Thought Content: Thought content normal.     Comments: dgt here to assist with history, patient  is poor historian.          Assessment And Plan:     1. Left hip pain  Arthritis vs bursitis vs fracture (least likely)  Will treat with kenalog due to the intensity of her pain, rating 9/10  Will send for a left hip xray to evaluate for structural damage - triamcinolone acetonide (KENALOG-40) injection 40 mg - DG Arthro Hip Left; Future      Arnette Felts, FNP

## 2019-01-24 NOTE — Patient Instructions (Signed)
Hip Pain  The hip is the joint between the upper legs and the lower pelvis. The bones, cartilage, tendons, and muscles of your hip joint support your body and allow you to move around. Hip pain can range from a minor ache to severe pain in one or both of your hips. The pain may be felt on the inside of the hip joint near the groin, or the outside near the buttocks and upper thigh. You may also have swelling or stiffness. Follow these instructions at home: Managing pain, stiffness, and swelling  If directed, apply ice to the injured area. ? Put ice in a plastic bag. ? Place a towel between your skin and the bag. ? Leave the ice on for 20 minutes, 2-3 times a day  Sleep with a pillow between your legs on your most comfortable side.  Avoid any activities that cause pain. General instructions  Take over-the-counter and prescription medicines only as told by your health care provider.  Do any exercises as told by your health care provider.  Record the following: ? How often you have hip pain. ? The location of your pain. ? What the pain feels like. ? What makes the pain worse.  Keep all follow-up visits as told by your health care provider. This is important. Contact a health care provider if:  You cannot put weight on your leg.  Your pain or swelling continues or gets worse after one week.  It gets harder to walk.  You have a fever. Get help right away if:  You fall.  You have a sudden increase in pain and swelling in your hip.  Your hip is red or swollen or very tender to touch. Summary  Hip pain can range from a minor ache to severe pain in one or both of your hips.  The pain may be felt on the inside of the hip joint near the groin, or the outside near the buttocks and upper thigh.  Avoid any activities that cause pain.  Record how often you have hip pain, the location of the pain, what makes it worse and what it feels like. This information is not intended to  replace advice given to you by your health care provider. Make sure you discuss any questions you have with your health care provider. Document Released: 04/14/2010 Document Revised: 09/27/2016 Document Reviewed: 09/27/2016 Elsevier Interactive Patient Education  2019 [employer]    USE CAPSACIN CREAM TO THE AREA THIS IS A PAIN CREAM OVER THE COUNTER.

## 2019-01-26 ENCOUNTER — Other Ambulatory Visit: Payer: Self-pay

## 2019-01-26 DIAGNOSIS — K219 Gastro-esophageal reflux disease without esophagitis: Secondary | ICD-10-CM

## 2019-01-26 MED ORDER — OMEPRAZOLE 40 MG PO CPDR: 40.0000 mg | DELAYED_RELEASE_CAPSULE | Freq: Every day | ORAL | 1 refills | Status: DC

## 2019-01-30 ENCOUNTER — Telehealth: Payer: Self-pay

## 2019-01-30 NOTE — Telephone Encounter (Signed)
Returned call. No longer the correct number for Patricia Simmons

## 2019-02-01 ENCOUNTER — Other Ambulatory Visit: Payer: Self-pay

## 2019-02-01 MED ORDER — DICLOFENAC SODIUM 1 % TD GEL: 2.0000 g | Freq: Four times a day (QID) | TRANSDERMAL | 2 refills | Status: DC

## 2019-02-01 NOTE — Telephone Encounter (Signed)
Returned the pt's sister call and left a message that Christell Constant, NP sent in a refill of diclofenac to the pharmacy for the patient.

## 2019-02-09 ENCOUNTER — Encounter: Payer: Self-pay | Admitting: Nurse Practitioner

## 2019-03-06 ENCOUNTER — Other Ambulatory Visit: Payer: Self-pay | Admitting: Nurse Practitioner

## 2019-03-06 ENCOUNTER — Telehealth: Payer: Self-pay

## 2019-03-06 DIAGNOSIS — F0390 Unspecified dementia without behavioral disturbance: Secondary | ICD-10-CM

## 2019-03-06 NOTE — Telephone Encounter (Signed)
Patient's daughter called stating her aunt had brought her mother to the office for her appointment and she told her that we had social workers here at the office and her brother got in contact with one Child psychotherapist and they have been waiting on her to call and she still has not reached out to her and her mother has dementia and a lot of problems and she needs them to get involved pretty fast. She called again stating she would like for the social worker to give her a call at [phone] [facility]

## 2019-03-07 ENCOUNTER — Ambulatory Visit: Payer: Self-pay

## 2019-03-07 DIAGNOSIS — I10 Essential (primary) hypertension: Secondary | ICD-10-CM

## 2019-03-07 DIAGNOSIS — F0390 Unspecified dementia without behavioral disturbance: Secondary | ICD-10-CM
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
## 2019-03-07 NOTE — Chronic Care Management (AMB) (Signed)
  Care Management   Initial Visit Note  03/07/2019 Name: Patricia Simmons MRN: [id number] DOB: 11/04/1936   Assessment: Patricia Simmons is a 83 y.o. year old female who sees Arnette Felts, FNP for primary care. The CCM team was consulted for assistance with chronic disease management related to Caregiver Stress support.    Review of patient status, including review of consultants reports, relevant laboratory and other test results, and collaboration with appropriate care team members and the patient's provider was performed as part of comprehensive patient evaluation and provision of chronic care management services.    I spoke with patient's daughter Patricia Simmons by telephone today. Patricia Simmons called into the office stating she had concerns for her mother's health and well being and would like a call back from "the SW". PHI was not disclosed to Patricia Simmons during our call due to no DPR or POA on file. She voiced having concerns that her mother is not being provided the proper care needed, by her sister Patricia Simmons. Patricia Simmons states a SW from an unknown agency made a face to face home visit with her mother in her sister's home and she would like the contact number for this SW so that she can report her concerns. Explained due to no DPR or POA on file, I will need to f/u with provider Arnette Felts, FNP report her concerns and to discuss next steps. She provided an updated phone number for her aunt Patricia Simmons.   Collaboration with embedded BSW Bevelyn Ngo regarding the daughter's complaints. Secure message sent to provider Arnette Felts, FNP regarding my call to Patricia Simmons. Further collaboration is needed to discuss next steps needed to f/u on these complaints.    Follow up plan:  Follow up with provider re: next steps for outreach to caregiver   Delsa Sale, [facility] Care Management Coordinator [facility] Care Management/[facility]  Direct Phone: [phone]

## 2019-03-09 ENCOUNTER — Ambulatory Visit: Payer: Self-pay

## 2019-03-09 NOTE — Chronic Care Management (AMB) (Signed)
  Chronic Care Management   Social Work Note  03/09/2019 Name: CANDIE BUSAM MRN: [id number] DOB: 02/13/1936  Elonda Husky Riggenbach is a 83 y.o. year old female who sees Arnette Felts, FNP for primary care. The CCM team was consulted for assistance with [employer] and Level of Care Concerns.   I placed a follow up call to the [facility] APS hotline requesting information on this patients case and if a home visit has been completed. CCM SW was transferred to a voice mailbox of Duard Larsen (unable to make out last name) CCM SW left a detailed message requesting a return call to confirm patients status and involvement with APS.   Follow Up Plan: SW will consult with patients primary provider. SW will plan to follow up with APS in the next 3 days.  Bevelyn Ngo, BSW, CDP [facility] / [facility] Care Management Social Worker [phone]  Total time spent performing care coordination and/or care management activities with the patient by phone or face to face = 10 minutes.
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
## 2019-03-16 ENCOUNTER — Ambulatory Visit: Payer: Self-pay

## 2019-03-16 DIAGNOSIS — F0390 Unspecified dementia without behavioral disturbance: Secondary | ICD-10-CM

## 2019-03-16 DIAGNOSIS — I10 Essential (primary) hypertension: Secondary | ICD-10-CM
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
## 2019-04-03 ENCOUNTER — Encounter: Payer: Self-pay | Admitting: Nurse Practitioner

## 2019-04-03 ENCOUNTER — Telehealth: Payer: Self-pay

## 2019-04-03 ENCOUNTER — Other Ambulatory Visit: Payer: Self-pay

## 2019-04-03 ENCOUNTER — Ambulatory Visit (INDEPENDENT_AMBULATORY_CARE_PROVIDER_SITE_OTHER): Payer: Medicare Other | Admitting: Nurse Practitioner

## 2019-04-03 DIAGNOSIS — W19XXXA Unspecified fall, initial encounter: Secondary | ICD-10-CM | POA: Insufficient documentation

## 2019-04-03 DIAGNOSIS — M25552 Pain in left hip: Secondary | ICD-10-CM | POA: Diagnosis not present

## 2019-04-03 DIAGNOSIS — W07XXXA Fall from chair, initial encounter: Secondary | ICD-10-CM | POA: Diagnosis not present

## 2019-04-03 NOTE — Progress Notes (Signed)
Virtual Visit via telephone    This visit type was conducted due to national recommendations for restrictions regarding the COVID-19 Pandemic (e.g. social distancing) in an effort to limit this patient's exposure and mitigate transmission in our community.  Patients identity confirmed using two different identifiers.  This format is felt to be most appropriate for this patient at this time.  All issues noted in this document were discussed and addressed.  No physical exam was performed (except for noted visual exam findings with Video Visits).    Date:  04/03/2019   ID:  Patricia SciaraLottie M Raffo, DOB [birth year], MRN [id number]  Patient Location:  Car - spoke with daughter Coy SaunasRosemary and Belva AgeeLottie Camp  Provider location:   Office    Chief Complaint:  Fall  History of Present Illness:    Patricia SciaraLottie M Dimario is a 83 y.o. female who presents via video conferencing for a telehealth visit today.    The patient does not have symptoms concerning for COVID-19 infection (fever, chills, cough, or new shortness of breath).   While sitting in straight chair she fell asleep and fell on left side. 3 days ago.    Hip Pain   The incident occurred 3 to 5 days ago. The injury mechanism was a fall. Pertinent negatives include no inability to bear weight, loss of motion or tingling. The symptoms are aggravated by movement. She has tried acetaminophen for the symptoms. The treatment provided mild relief.     Past Medical History:  Diagnosis Date  . Arthritis   . Blood transfusion without reported diagnosis   . Hypertension    Past Surgical History:  Procedure Laterality Date  . ABDOMINAL HYSTERECTOMY    . HEMORROIDECTOMY    . TUBAL LIGATION       Current Meds  Medication Sig  . acetaminophen (TYLENOL) 500 MG tablet Take 1,000 mg by mouth every 6 (six) hours as needed for mild pain or headache.  . citalopram (CELEXA) 10 MG tablet Take 1 tablet (10 mg total) by mouth daily.  . diclofenac sodium (VOLTAREN) 1  % GEL Apply 2 g topically 4 (four) times daily.  . hydrochlorothiazide (HYDRODIURIL) 12.5 MG tablet Take 2 tablets (25 mg total) by mouth daily.  [name] Kitchen. losartan (COZAAR) 50 MG tablet Take 1 tablet (50 mg total) by mouth daily.  [name] Kitchen. omeprazole (PRILOSEC) 40 MG capsule Take 1 capsule (40 mg total) by mouth daily.  [name] Kitchen. triamcinolone cream (KENALOG) 0.1 % Apply 1 application topically 2 (two) times daily.     Allergies:   Codeine   Social History   Tobacco Use  . Smoking status: Never Smoker  . Smokeless tobacco: Never Used  Substance Use Topics  . Alcohol use: No  . Drug use: No     Family Hx: The patient's family history includes Emphysema in her brother; Hyperlipidemia in her sister; Hypertension in her mother and sister; Lung cancer in her brother; Prostate cancer in her father; Stroke in her mother.  ROS:   Please see the history of present illness.    Review of Systems  Constitutional: Negative.   Respiratory: Negative.   Cardiovascular: Negative.   Musculoskeletal: Negative for myalgias. Joint pain: left hip   Neurological: Negative for dizziness and tingling.  Psychiatric/Behavioral: Negative for depression. The patient is not nervous/anxious.     All other systems reviewed and are negative.   Labs/Other Tests and Data Reviewed:    Recent Labs: 01/09/2019: ALT 12; BUN 9; Creatinine, Ser 0.70; Hemoglobin 13.5; Platelets  224; Potassium 3.4; Sodium 139   Recent Lipid Panel No results found for: CHOL, TRIG, HDL, CHOLHDL, LDLCALC, LDLDIRECT  Wt Readings from Last 3 Encounters:  01/24/19 158 lb 12.8 oz (72 kg)  01/18/19 160 lb (72.6 kg)  01/09/19 150 lb (68 kg)     Exam:    Vital Signs:  There were no vitals taken for this visit.    Physical Exam  Constitutional: She is oriented to person, place, and time. No distress.  Neurological: She is alert and oriented to person, place, and time.  Poor judgement and not a good historian. Dgt provides information  Telephone visit   ASSESSMENT & PLAN:    1. Fall, initial encounter  Will check urinalysis patient went to the bathroom but poured the urine out of the hat into the toilet.  Sent her home with a hat and sterile urine cup  Fell out of straight chair after dosing off to sleep, will follow up with CCM for any assistance at home  2. Left hip pain  Worsening pain to left hip after fall  Advised to use diclofenac gel to the area.  Will check hip xray for fractures or structural damage she may have aggravated this hip from previous pain and discomfort   COVID-19 Education: The signs and symptoms of COVID-19 were discussed with the patient and how to seek care for testing (follow up with PCP or arrange E-visit).  The importance of social distancing was discussed today.  Patient Risk:   After full review of this patients clinical status, I feel that they are at least moderate risk at this time.  Time:   Today, I have spent 11 minutes/ seconds with the patient with telehealth technology discussing above diagnoses.     Medication Adjustments/Labs and Tests Ordered: Current medicines are reviewed at length with the patient today.  Concerns regarding medicines are outlined above.   Tests Ordered: No orders of the defined types were placed in this encounter.   Medication Changes: No orders of the defined types were placed in this encounter.   Disposition:  Follow up prn  Signed, Arnette Felts, FNP

## 2019-04-03 NOTE — Telephone Encounter (Signed)
Called to make appt for fall-no answer left vm

## 2019-04-04 ENCOUNTER — Telehealth: Payer: Self-pay

## 2019-04-04 NOTE — Telephone Encounter (Signed)
Coy Saunas patient's daughter called stating pt leg Is still swollen and soaking it did not help. She would like an appointment.  RETURNED HER CALL AND ASKED HER IF SHE WENT TO GET HER XRAY DONE AND SHE STATED SHE WASN'T AWARE THAT SHE WAS SUPPOSE TO GO I NOTIFIED PT THAT THE ORDER WAS PLACED YESTERDAY AND THAT NEEDS TO GO AHEAD AND GO TO [facility] ON [address] TO HAVE THE IMAGES DOWN ALSO NOTIFIED HER THAT WE HAVE PUT IN FOR HER TO HAVE AN ULTRASOUND DONE ON HER LEG AND THEY WILL CONTACT HER FOR AN APPOINTMENT YRL,RMA

## 2019-04-05 ENCOUNTER — Emergency Department
Admission: EM | Admit: 2019-04-05 | Discharge: 2019-04-05 | Disposition: A | Discharge status: Home / Self Care | Payer: Medicare Other | Attending: Emergency Medicine | Admitting: Emergency Medicine

## 2019-04-05 ENCOUNTER — Emergency Department: Payer: Medicare Other

## 2019-04-05 ENCOUNTER — Other Ambulatory Visit: Payer: Self-pay

## 2019-04-05 ENCOUNTER — Encounter: Payer: Self-pay | Admitting: Emergency Medicine

## 2019-04-05 DIAGNOSIS — Y9389 Activity, other specified: Secondary | ICD-10-CM | POA: Diagnosis not present

## 2019-04-05 DIAGNOSIS — I10 Essential (primary) hypertension: Secondary | ICD-10-CM | POA: Insufficient documentation

## 2019-04-05 DIAGNOSIS — R600 Localized edema: Secondary | ICD-10-CM | POA: Diagnosis not present

## 2019-04-05 DIAGNOSIS — Z79899 Other long term (current) drug therapy: Secondary | ICD-10-CM | POA: Diagnosis not present

## 2019-04-05 DIAGNOSIS — S79912A Unspecified injury of left hip, initial encounter: Secondary | ICD-10-CM | POA: Diagnosis present

## 2019-04-05 DIAGNOSIS — R609 Edema, unspecified: Secondary | ICD-10-CM

## 2019-04-05 DIAGNOSIS — S7002XA Contusion of left hip, initial encounter: Secondary | ICD-10-CM | POA: Diagnosis not present

## 2019-04-05 DIAGNOSIS — W19XXXA Unspecified fall, initial encounter: Secondary | ICD-10-CM | POA: Diagnosis not present

## 2019-04-05 DIAGNOSIS — F0390 Unspecified dementia without behavioral disturbance: Secondary | ICD-10-CM | POA: Insufficient documentation

## 2019-04-05 DIAGNOSIS — Y9289 Other specified places as the place of occurrence of the external cause: Secondary | ICD-10-CM | POA: Diagnosis not present

## 2019-04-05 DIAGNOSIS — M25552 Pain in left hip: Secondary | ICD-10-CM | POA: Diagnosis not present

## 2019-04-05 DIAGNOSIS — Y998 Other external cause status: Secondary | ICD-10-CM | POA: Insufficient documentation

## 2019-04-05 DIAGNOSIS — R2243 Localized swelling, mass and lump, lower limb, bilateral: Secondary | ICD-10-CM | POA: Diagnosis not present

## 2019-04-05 LAB — COMPREHENSIVE METABOLIC PANEL
ALT: 13 U/L (ref 0–44)
AST: 18 U/L (ref 15–41)
Albumin: 3.8 g/dL (ref 3.5–5.0)
Alkaline Phosphatase: 57 U/L (ref 38–126)
Anion gap: 12 (ref 5–15)
BUN: 13 mg/dL (ref 8–23)
CO2: 26 mmol/L (ref 22–32)
Calcium: 9.2 mg/dL (ref 8.9–10.3)
Chloride: 93 mmol/L — ABNORMAL LOW (ref 98–111)
Creatinine, Ser: 0.65 mg/dL (ref 0.44–1.00)
GFR calc Af Amer: >60 mL/min (ref >60)
GFR calc non Af Amer: >60 mL/min (ref >60)
Glucose, Bld: 106 mg/dL — ABNORMAL HIGH (ref 70–99)
Potassium: 3.3 mmol/L — ABNORMAL LOW (ref 3.5–5.1)
Sodium: 131 mmol/L — ABNORMAL LOW (ref 135–145)
Total Bilirubin: 0.9 mg/dL (ref 0.3–1.2)
Total Protein: 7.2 g/dL (ref 6.5–8.1)

## 2019-04-05 LAB — CBC WITH DIFFERENTIAL/PLATELET
Abs Immature Granulocytes: 0.01 10*3/uL (ref 0.00–0.07)
Basophils Absolute: 0 10*3/uL (ref 0.0–0.1)
Basophils Relative: 1 %
Eosinophils Absolute: 0.1 10*3/uL (ref 0.0–0.5)
Eosinophils Relative: 2 %
HCT: 38.7 % (ref 36.0–46.0)
Hemoglobin: 12.4 g/dL (ref 12.0–15.0)
Immature Granulocytes: 0 %
Lymphocytes Relative: 37 %
Lymphs Abs: 1.8 10*3/uL (ref 0.7–4.0)
MCH: 30.6 pg (ref 26.0–34.0)
MCHC: 32 g/dL (ref 30.0–36.0)
MCV: 95.6 fL (ref 80.0–100.0)
Monocytes Absolute: 0.6 10*3/uL (ref 0.1–1.0)
Monocytes Relative: 12 %
Neutro Abs: 2.3 10*3/uL (ref 1.7–7.7)
Neutrophils Relative %: 48 %
Platelets: 166 10*3/uL (ref 150–400)
RBC: 4.05 MIL/uL (ref 3.87–5.11)
RDW: 12.3 % (ref 11.5–15.5)
WBC: 4.8 10*3/uL (ref 4.0–10.5)
nRBC: 0 % (ref 0.0–0.2)

## 2019-04-05 LAB — TROPONIN I: Troponin I: <0.03 ng/mL (ref <0.03)

## 2019-04-05 NOTE — ED Provider Notes (Signed)
[facility] Emergency Department Provider Note   ____________________________________________    I have reviewed the triage vital signs and the nursing notes.   HISTORY  Chief Complaint Fall     HPI Patricia Simmons is a 83 y.o. female with a history of dementia who presents because of a fall and left hip pain.  Also reports of lower extremity edema, unclear whether this is chronic or new.  The patient is unable provide significant history.  Past Medical History:  Diagnosis Date  . Arthritis   . Blood transfusion without reported diagnosis   . Hypertension     Patient Active Problem List   Diagnosis Date Noted  . Left hip pain 04/03/2019  . Fall 04/03/2019  . Keloid 12/21/2018  . Dementia without behavioral disturbance (HCC) 09/08/2018  . Depression 09/08/2018  . Hypokalemia 07/30/2018  . Chest pain 07/30/2018  . GERD (gastroesophageal reflux disease) 07/30/2018  . Essential hypertension 07/30/2018    Past Surgical History:  Procedure Laterality Date  . ABDOMINAL HYSTERECTOMY    . HEMORROIDECTOMY    . TUBAL LIGATION      Prior to Admission medications   Medication Sig Start Date End Date Taking? Authorizing Provider  acetaminophen (TYLENOL) 500 MG tablet Take 1,000 mg by mouth every 6 (six) hours as needed for mild pain or headache.    [provider]  citalopram (CELEXA) 10 MG tablet Take 1 tablet (10 mg total) by mouth daily. 12/21/18 12/21/19  Arnette FeltsMoore, Janece, FNP  diclofenac sodium (VOLTAREN) 1 % GEL Apply 2 g topically 4 (four) times daily. 02/01/19   [name], Robyn, MD  hydrochlorothiazide (HYDRODIURIL) 12.5 MG tablet Take 2 tablets (25 mg total) by mouth daily. 01/18/19   Arnette FeltsMoore, Janece, FNP  losartan (COZAAR) 50 MG tablet Take 1 tablet (50 mg total) by mouth daily. 12/21/18   Arnette FeltsMoore, Janece, FNP  omeprazole (PRILOSEC) 40 MG capsule Take 1 capsule (40 mg total) by mouth daily. 01/26/19   Arnette FeltsMoore, Janece, FNP  triamcinolone cream  (KENALOG) 0.1 % Apply 1 application topically 2 (two) times daily. 12/21/18   Arnette FeltsMoore, Janece, FNP     Allergies Codeine  Family History  Problem Relation Age of Onset  . Stroke Mother   . Hypertension Mother   . Prostate cancer Father   . Hypertension Sister   . Hyperlipidemia Sister   . Emphysema Brother   . Lung cancer Brother     Social History Social History   Tobacco Use  . Smoking status: Never Smoker  . Smokeless tobacco: Never Used  Substance Use Topics  . Alcohol use: No  . Drug use: No    Review of Systems     ____________________________________________   PHYSICAL EXAM:  VITAL SIGNS: ED Triage Vitals  Enc Vitals Group     BP 04/05/19 1731 139/77     Pulse Rate 04/05/19 1731 76     Resp 04/05/19 1731 18     Temp 04/05/19 1731 98.9 F (37.2 C)     Temp Source 04/05/19 1731 Oral     SpO2 04/05/19 1731 100 %     Weight 04/05/19 1734 81.6 kg (180 lb)     Height 04/05/19 1734 1.778 m (5\' 10" )     Head Circumference --      Peak Flow --      Pain Score 04/05/19 1732 6     Pain Loc --      Pain Edu? --  Excl. in GC? --     Constitutional: Alert  Eyes: Conjunctivae are normal.  Head: Atraumatic. Nose: No congestion/rhinnorhea. Mouth/Throat: Mucous membranes are moist.   Neck:  Painless ROM Cardiovascular: Normal rate, regular rhythm. Grossly normal heart sounds.  Good peripheral circulation. Respiratory: Normal respiratory effort.  No retractions. Lungs CTAB. Gastrointestinal: Soft and nontender. No distention.  No CVA tenderness.  Musculoskeletal: 1+ edema bilaterally, warm and well perfused, no tenderness palpation.  Full range of motion of left hip without significant pain.  No pain with axial load, no bony tenderness to palpation, lower extremities warm well perfused Neurologic: . No gross focal neurologic deficits are appreciated.  Skin:  Skin is warm, dry and intact. No rash noted.   ____________________________________________    LABS (all labs ordered are listed, but only abnormal results are displayed)  Labs Reviewed  COMPREHENSIVE METABOLIC PANEL - Abnormal; Notable for the following components:      Result Value   Sodium 131 (*)    Potassium 3.3 (*)    Chloride 93 (*)    Glucose, Bld 106 (*)    All other components within normal limits  CBC WITH DIFFERENTIAL/PLATELET  TROPONIN I   ____________________________________________  EKG  ED ECG REPORT I, Jene Every, the attending physician, personally viewed and interpreted this ECG.  Date: 04/05/2019  Rhythm: normal sinus rhythm QRS Axis: normal Intervals: normal ST/T Wave abnormalities: normal Narrative Interpretation: no evidence of acute ischemia  ____________________________________________  RADIOLOGY  Left hip x-ray negative ____________________________________________   PROCEDURES  Procedure(s) performed: No  Procedures   Critical Care performed: No ____________________________________________   INITIAL IMPRESSION / ASSESSMENT AND PLAN / ED COURSE  Pertinent labs & imaging results that were available during my care of the patient were reviewed by me and considered in my medical decision making (see chart for details).  Patient overall well-appearing and in no acute distress, vital signs are quite reassuring.  Exam is significant for 1+ edema bilaterally but no tenderness, no crackles on exam or shortness of breath.  Hip evaluation is reassuring, the patient does ambulate with a cane.  X-ray negative for fracture.  She is appropriate for outpatient follow-up    ____________________________________________   FINAL CLINICAL IMPRESSION(S) / ED DIAGNOSES  Final diagnoses:  Fall, initial encounter  Contusion of left hip, initial encounter  Peripheral edema        Note:  This document was prepared using Dragon voice recognition software and may include unintentional dictation errors.   Jene Every, MD 04/05/19  Barry Brunner

## 2019-04-05 NOTE — ED Triage Notes (Signed)
[facility] with concerns over bilateral pitting edema to both lower extremeties.  Pt fell on Monday and also reports left hip pain. Pt poor historian. Pt's daughter called with no answer.

## 2019-04-05 NOTE — ED Notes (Signed)
Patient discharged home, paperwork reviewed, notified to follow up with FNP. Patient wheeled to daughter POV in good condition

## 2019-04-16 ENCOUNTER — Telehealth: Payer: Self-pay

## 2019-04-16 ENCOUNTER — Telehealth: Payer: Self-pay | Admitting: Nurse Practitioner
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
## 2019-04-16 NOTE — Telephone Encounter (Signed)
Patient called stating she has not heard about her appointment for her mammogram.   RETURNED PT CALL AND  NOTIFIED HER THAT THEY SHOULD BE GIVING HER A CALL. YRL,RMA

## 2019-04-17 ENCOUNTER — Telehealth: Payer: Self-pay

## 2019-04-17 NOTE — Telephone Encounter (Signed)
Returned pt call and scheduled an appointment [facility]

## 2019-04-19 ENCOUNTER — Encounter: Payer: Self-pay | Admitting: Nurse Practitioner

## 2019-04-19 ENCOUNTER — Other Ambulatory Visit: Payer: Self-pay

## 2019-04-19 ENCOUNTER — Ambulatory Visit (INDEPENDENT_AMBULATORY_CARE_PROVIDER_SITE_OTHER): Payer: Medicare Other | Admitting: Nurse Practitioner

## 2019-04-19 VITALS — BP 132/80 | HR 72 | Temp 98.3°F | Ht 61.2 in | Wt 155.4 lb

## 2019-04-19 DIAGNOSIS — S40022A Contusion of left upper arm, initial encounter: Secondary | ICD-10-CM

## 2019-04-19 DIAGNOSIS — R600 Localized edema: Secondary | ICD-10-CM

## 2019-04-19 DIAGNOSIS — R21 Rash and other nonspecific skin eruption: Secondary | ICD-10-CM | POA: Diagnosis not present

## 2019-04-19 DIAGNOSIS — F0390 Unspecified dementia without behavioral disturbance: Secondary | ICD-10-CM

## 2019-04-19 DIAGNOSIS — T148XXA Other injury of unspecified body region, initial encounter: Secondary | ICD-10-CM

## 2019-04-19 MED ORDER — FUROSEMIDE 20 MG PO TABS: 20.0000 mg | ORAL_TABLET | Freq: Every day | ORAL | 0 refills | Status: DC

## 2019-04-19 MED ORDER — POTASSIUM CHLORIDE ER 10 MEQ PO TBCR: 10.0000 meq | EXTENDED_RELEASE_TABLET | Freq: Every day | ORAL | 0 refills | Status: DC

## 2019-04-19 NOTE — Patient Instructions (Addendum)
   Take furosemide daily until Monday  Take potassium daily until Monday  Increase water and avoid soda intake  Elevate legs while sitting  Call on Monday with an update

## 2019-04-19 NOTE — Progress Notes (Signed)
Subjective:     Patient ID: Patricia Simmons , female    DOB: 1936/08/04 , 83 y.o.   MRN: [id number]   Chief Complaint  Patient presents with  . Leg Swelling    patient's daughter stated she took pt to hospital for her leg swelling but she was unable to go back with her so she is not sure what was done    HPI  Bilateral lower extremity swelling - this has been ongoing for about 3 weeks.  Her daughter reports they are slightly better today.  She has a good appetite.  Will drink some water.  She is unable to put on her regular shoes.    Her daughter wants to have her placed to a facility.      Past Medical History:  Diagnosis Date  . Arthritis   . Blood transfusion without reported diagnosis   . Hypertension      Family History  Problem Relation Age of Onset  . Stroke Mother   . Hypertension Mother   . Prostate cancer Father   . Hypertension Sister   . Hyperlipidemia Sister   . Emphysema Brother   . Lung cancer Brother      Current Outpatient Medications:  .  acetaminophen (TYLENOL) 500 MG tablet, Take 1,000 mg by mouth every 6 (six) hours as needed for mild pain or headache., Disp: , Rfl:  .  citalopram (CELEXA) 10 MG tablet, Take 1 tablet (10 mg total) by mouth daily., Disp: 90 tablet, Rfl: 1 .  diclofenac sodium (VOLTAREN) 1 % GEL, Apply 2 g topically 4 (four) times daily., Disp: 1 Tube, Rfl: 2 .  hydrochlorothiazide (HYDRODIURIL) 12.5 MG tablet, Take 2 tablets (25 mg total) by mouth daily., Disp: 90 tablet, Rfl: 1 .  losartan (COZAAR) 50 MG tablet, Take 1 tablet (50 mg total) by mouth daily., Disp: 90 tablet, Rfl: 1 .  omeprazole (PRILOSEC) 40 MG capsule, Take 1 capsule (40 mg total) by mouth daily., Disp: 90 capsule, Rfl: 1 .  triamcinolone cream (KENALOG) 0.1 %, Apply 1 application topically 2 (two) times daily., Disp: 30 g, Rfl: 0   Allergies  Allergen Reactions  . Codeine     unknown     Review of Systems  Constitutional: Negative for fatigue and fever.   Sleeping more  Respiratory: Negative.  Negative for cough and shortness of breath.        Daughter reports she seems to be "gagging" for air at times  Cardiovascular: Negative.  Negative for chest pain, palpitations and leg swelling.  Endocrine: Negative for polydipsia, polyphagia and polyuria.  Musculoskeletal: Negative for arthralgias and back pain.  Neurological: Negative for dizziness and headaches.     Today's Vitals   04/19/19 0828  BP: 132/80  Pulse: 72  Temp: 98.3 F (36.8 C)  TempSrc: Oral  Weight: 155 lb 6.4 oz (70.5 kg)  Height: 5' 1.2" (1.554 m)  PainSc: 0-No pain   Body mass index is 29.17 kg/m.   Objective:  Physical Exam Vitals signs reviewed.  Constitutional:      Appearance: Normal appearance.  Cardiovascular:     Rate and Rhythm: Normal rate and regular rhythm.     Pulses: Normal pulses.     Heart sounds: Normal heart sounds. No murmur.  Musculoskeletal:        General: Tenderness (left hip) present.     Left hip: She exhibits tenderness. She exhibits normal range of motion, no swelling, no crepitus and no deformity.  Skin:    General: Skin is warm and dry.     Capillary Refill: Capillary refill takes less than 2 seconds.     Findings: Bruising (left upper arm with 3cm x 4cm bruise light purple ) and rash (raised vesicles scattered to left upper arm with slight erythema) present.  Neurological:     General: No focal deficit present.     Mental Status: She is alert and oriented to person, place, and time. Mental status is at baseline.  Psychiatric:        Mood and Affect: Mood normal.        Behavior: Behavior normal.        Thought Content: Thought content normal.        Judgment: Judgment normal.     Comments: dgt here to assist with history, patient is poor historian.          Assessment And Plan:        1. Localized edema  Bilateral lower extremities with 1+ edema nonpitting  Will add furosemide 20mg  for 4 days, her daughter is to call  on Monday with an update.   Encouraged to elevate feet. - BMP8+Anion Gap - Brain natriuretic peptide - furosemide (LASIX) 20 MG tablet; Take 1 tablet (20 mg total) by mouth daily.  Dispense: 30 tablet; Refill: 0 - potassium chloride (K-DUR) 10 MEQ tablet; Take 1 tablet (10 mEq total) by mouth daily.  Dispense: 30 tablet; Refill: 0  2. Dementia without behavioral disturbance, unspecified dementia type ([place])  She is worsening with her disease process  Sleeping more per daughter  Increasing falls  Not talking as much  Requiring assistance with bathing and dressing  Her daughter would like to get her placed in a long term care facility  3. Rash and nonspecific skin eruption  Several raised vesicles to her left upper arm laterally  Advised to use Kenalog cream   4. Bruise  Approximately 3cm x 4cm light purple bruise to her lateral upper arm  Daughter reports she bumped into something  [name], FNP    THE PATIENT IS ENCOURAGED TO PRACTICE SOCIAL DISTANCING DUE TO THE COVID-19 PANDEMIC.

## 2019-04-20 ENCOUNTER — Ambulatory Visit: Payer: Self-pay

## 2019-04-20 LAB — BMP8+ANION GAP
Anion Gap: 17 mmol/L (ref 10.0–18.0)
BUN/Creatinine Ratio: 15 (ref 12–28)
BUN: 12 mg/dL (ref 8–27)
CO2: 25 mmol/L (ref 20–29)
Calcium: 10 mg/dL (ref 8.7–10.3)
Chloride: 96 mmol/L (ref 96–106)
Creatinine, Ser: 0.79 mg/dL (ref 0.57–1.00)
GFR calc Af Amer: 81 mL/min/{1.73_m2} (ref >59)
GFR calc non Af Amer: 70 mL/min/{1.73_m2} (ref >59)
Glucose: 107 mg/dL — ABNORMAL HIGH (ref 65–99)
Potassium: 3.6 mmol/L (ref 3.5–5.2)
Sodium: 138 mmol/L (ref 134–144)

## 2019-04-20 LAB — BRAIN NATRIURETIC PEPTIDE: BNP: 38.6 pg/mL (ref 0.0–100.0)

## 2019-04-20 NOTE — Chronic Care Management (AMB) (Signed)
  Chronic Care Management   Outreach Note  04/20/2019 Name: Patricia Simmons MRN: [id number] DOB: Nov 07, 1936  Referred by: [name], FNP Reason for referral : Care Coordination   An unsuccessful telephone outreach was attempted to the patients daughter and primary caregiver Patricia Simmons today. The patient was referred to the case management team by for assistance with chronic care management and care coordination.   Follow Up Plan: A HIPPA compliant phone message was left for the patient providing contact information and requesting a return call.  The care management team will reach out to the patient again over the next 10 days.   Daneen Schick, BSW, CDP Social Worker, Certified Dementia Practitioner [place] [facility] [place] [facility] [phone]

## 2019-04-24 ENCOUNTER — Ambulatory Visit: Payer: Self-pay

## 2019-04-24 ENCOUNTER — Ambulatory Visit (INDEPENDENT_AMBULATORY_CARE_PROVIDER_SITE_OTHER): Payer: Medicare Other

## 2019-04-24 DIAGNOSIS — I10 Essential (primary) hypertension: Secondary | ICD-10-CM

## 2019-04-24 DIAGNOSIS — E876 Hypokalemia: Secondary | ICD-10-CM

## 2019-04-24 DIAGNOSIS — F0390 Unspecified dementia without behavioral disturbance: Secondary | ICD-10-CM

## 2019-04-24 NOTE — Chronic Care Management (AMB) (Signed)
  Chronic Care Management   Initial Visit Note  04/24/2019 Name: Patricia Simmons MRN: [id number] DOB: Apr 02, 1936  Referred by: [name], FNP Reason for referral : Chronic Care Management (CCM RNCM Case Collaboration )   [name] is a 83 y.o. year old female who is a primary care patient of [name], [place]. The care management team was consulted for assistance with chronic disease management and care coordination needs.   Review of patient status, including review of consultants reports, relevant laboratory and other test results, and collaboration with appropriate care team members and the patient's provider was performed as part of comprehensive patient evaluation and provision of chronic care management services.    I initiated and established the plan of care for Patricia Simmons during one on one collaboration with my clinical care management colleague Daneen Schick BSW who is also engaged with this patient to address social work needs.   Goals Addressed    . Assist with Chronic Disease Management and Care Coordination needs       Current Barriers:  [name] Kitchen Knowledge Barriers related to resources and support available to address needs related to Chronic disease management and Community Resource needs  Case Manager Clinical Goal(s):  [name] Kitchen Over the next 30 days, patient will work with the CCM team to address needs related to Chronic Disease Management and Care Coordination needs.   Interventions:  . Collaborated with BSW and initiated plan of care to address needs related to assistance with LTC placement and chronic disease management for dementia and Essential Hypertension.   . Scheduled an initial CCM RN CM Telephone outreach to patient's daughter and caregiver Patricia Simmons   Patient Self Care Activities:  . Currently UNABLE TO independently perform self care due to advanced dementia   Initial goal documentation        Telephone follow up appointment with care management  team member scheduled for: 05/02/19  [name], RN, BSN, CCM Care Management Coordinator [facility]  Direct Phone: [phone]

## 2019-04-25 NOTE — Patient Instructions (Signed)
Social Worker Visit Information  Goals we discussed today:  Goals Addressed            This Visit's Progress   . "I want her to have a life alert"       Daughter Stated:  Current Barriers:  . Level of care concerns . Social Isolation . Memory Deficits . Lacks knowledge of health plan benefits  Clinical Social Work Clinical Goal(s):  [name] Kitchen Over the next 30 days, patients daughter will become more knowledgeable of health plan benefits . Over the next 45 days, patient will work with SW to obtain a personal emergency response system through her health plan benefit  Interventions: . Outbound call to the patients daughter and caregiver Patricia Simmons . Assessed for patient needs; Patricia Simmons reports the patient has dementia and she does not feel safe leaving her home alone. The patient often refuses to run errands with [name] which leads to concern for the patients safety. . Reviewed patients health coverage to confirm the patient has benefits under Patricia Simmons . Educated Patricia Simmons on health plan benefits covered under the patients plan including over the counter catalog and possibility of a personal emergency response system at no extra cost to the patient . Assisted Patricia Simmons in contacting the patients health plan to confirm the patient does have access to a Personal Emergency Response System (PERS)  . Attempted to assist Patricia Simmons in contacting Patricia Simmons to order an over the counter catalog and Patricia Simmons to order a personal emergency response system- Patricia Simmons stated "you can do it" . Explained that due to HIPAA regulations, SW was unable to order items on the patients behalf without the patient and/or her guardian on the call . Scheduled appointment with Patricia Simmons at a later date to assist with goal progression  Patient Self Care Activities:  . Currently UNABLE TO independently care for self at home.  Initial goal documentation     . "my mom needs  placement"       Daughter stated:  Current Barriers:  . Financial constraints . Level of care concerns . Memory Deficits . Inability to perform ADL's independently . Inability to perform IADL's independently . Lacks knowledge of the difference between Medicare and Medicaid . COVID 19 limiting admissions to SNF and ALF communities . Patients daughter/primary caregiver will return to work in August  [place]):  [name] Kitchen Over the next 60 days, patient will work with SW to address needs related to level of care  CCM SW Interventions: Completed with Patricia Simmons . Assessed for current patient care needs- the patient walks with a cane and is a high fall risk. She currently does wander and needs assistance with ADL's including prompts.cue's and hands on assistance with bathing. The patient does have a dementia diagnosis and cannot be left alone . Determined the patient lives with her daughter Patricia Simmons who is the primary caregiver. Patricia Simmons will return to work in August and feels it would be best if the patient were placed . Educated Patricia Simmons on the difference in ALF and SNF including care provided as well as cost. Explained that due to the patients cognition she may qualify for both pending their capability to provide a secure living environment . Explained to Patricia Simmons that the patients Medicare does not cover the cost of long-term care . Attempted to assess for patients ability to qualify for long-term care Medicaid; unfortunately, Patricia Simmons is unable to recall  the patients monthly income. It is however reported the patient does not have any large assets . Discussed caregiver options with Patricia Simmons including in home PCS services, PACE programs, ALF, and SNF.  [name] Kitchen Determined Patricia Simmons is solely interested in placement at this time . Reviewed ways to apply for Medicaid with Patricia Simmons, [name] prefers to have an application mailed to her  home . Outbound call to [place] ([place] is reported to live[place] voice message left requesting guidance on how the patient's daughter can proceed with application and which SNF/ALF communities are currently accepting admissions . Discussed plans with Patricia Simmons for ongoing care management follow up and provided Patricia Simmons with direct contact information for care management team  Patient Self Care Activities:  . Currently UNABLE TO independently perform ADL's and iADL's due to cognition  Initial goal documentation         Materials provided: No: Patient declined  Patricia Simmons was given information about Chronic Care Management services today including:  Simmons. CCM service includes personalized support from designated clinical staff supervised by her physician, including individualized plan of care and coordination with other care providers 2. 24/7 contact phone numbers for assistance for urgent and routine care needs. 3. Service will only be billed when office clinical staff spend 20 minutes or more in a month to coordinate care. 4. Only one practitioner may furnish and bill the service in a calendar month. 5. The patient may stop CCM services at any time (effective at the end of the month) by phone call to the office staff. 6. The patient will be responsible for cost sharing (co-pay) of up to 20% of the service fee (after annual deductible is met).  Patient agreed to services and verbal consent obtained.   The patient verbalized understanding of instructions provided today and declined a print copy of patient instruction materials.   Follow up plan: SW will follow up with patient by phone over the next 10-14 days  Daneen Schick, BSW, CDP Social Worker, Certified Dementia Practitioner [place] [facility] [place] [facility] [phone]

## 2019-04-25 NOTE — Chronic Care Management (AMB) (Signed)
Chronic Care Management    Clinical Social Work General Note  04/24/2019 Name: Patricia Simmons MRN: [id number] DOB: 1936-10-13  Patricia Simmons is a 83 y.o. year old female who is a primary care patient of [name], [place]. The CCM was consulted to assist the patient with Level of Care Concerns.   I placed a call to the patients daughter and primary caregiver Alfredia Ferguson whom cares for the patient due to her cognitive status.  [name] was given information about Chronic Care Management services today and how the program would serve the patient including:  1. CCM service includes personalized support from designated clinical staff supervised by her physician, including individualized plan of care and coordination with other care providers 2. 24/7 contact phone numbers for assistance for urgent and routine care needs. 3. Service will only be billed when office clinical staff spend 20 minutes or more in a month to coordinate care. 4. Only one practitioner may furnish and bill the service in a calendar month. 5. The patient may stop CCM services at any time (effective at the end of the month) by phone call to the office staff. 6. The patient will be responsible for cost sharing (co-pay) of up to 20% of the service fee (after annual deductible is met).  Patient's daughter Alfredia Ferguson agreed to services and verbal consent obtained.   Review of patient status, including review of consultants reports, relevant laboratory and other test results, and collaboration with appropriate care team members and the patient's provider was performed as part of comprehensive patient evaluation and provision of chronic care management services.    SDOH (Social Determinants of Health) screening performed today. No current SDOH challenges are noted during today's call.  Patricia Simmons does report she returns to work in August and feels the patient needs long term care placement due to safety concerns and  patients dementia diagnosis.  Goals Addressed            This Visit's Progress   . "I want her to have a life alert"       Daughter Stated:  Current Barriers:  . Level of care concerns . Social Isolation . Memory Deficits . Lacks knowledge of health plan benefits  Clinical Social Work Clinical Goal(s):  [name] Kitchen Over the next 30 days, patients daughter will become more knowledgeable of health plan benefits . Over the next 45 days, patient will work with SW to obtain a personal emergency response system through her health plan benefit  Interventions: . Outbound call to the patients daughter and caregiver Alfredia Ferguson . Assessed for patient needs; Patricia Simmons reports the patient has dementia and she does not feel safe leaving her home alone. The patient often refuses to run errands with [name] which leads to concern for the patients safety. . Reviewed patients health coverage to confirm the patient has benefits under [place] [employer] . Educated Patricia Simmons on health plan benefits covered under the patients plan including over the counter catalog and possibility of a personal emergency response system at no extra cost to the patient . Assisted Patricia Simmons in contacting the patients health plan to confirm the patient does have access to a Personal Emergency Response System (PERS)  . Attempted to assist Patricia Simmons in contacting [employer] to order an over the counter catalog and [employer] to order a personal emergency response system- Patricia Simmons stated "you can do it" . Explained that due to HIPAA regulations, SW was unable to  order items on the patients behalf without the patient and/or her guardian on the call . Scheduled appointment with Patricia Simmons at a later date to assist with goal progression  Patient Self Care Activities:  . Currently UNABLE TO independently care for self at home.  Initial goal documentation     . "my mom needs placement"       Daughter stated:   Current Barriers:  . Financial constraints . Level of care concerns . Memory Deficits . Inability to perform ADL's independently . Inability to perform IADL's independently . Lacks knowledge of the difference between Medicare and Medicaid . COVID 19 limiting admissions to SNF and ALF communities . Patients daughter/primary caregiver will return to work in August  [name]):  [name] Kitchen Over the next 60 days, patient will work with SW to address needs related to level of care  CCM SW Interventions: Completed with [name] . Assessed for current patient care needs- the patient walks with a cane and is a high fall risk. She currently does wander and needs assistance with ADL's including prompts.cue's and hands on assistance with bathing. The patient does have a dementia diagnosis and cannot be left alone . Determined the patient lives with her daughter Alfredia Ferguson who is the primary caregiver. Patricia Simmons will return to work in August and feels it would be best if the patient were placed . Educated Patricia Simmons on the difference in ALF and SNF including care provided as well as cost. Explained that due to the patients cognition she may qualify for both pending their capability to provide a secure living environment . Explained to Patricia Simmons that the patients Medicare does not cover the cost of long-term care . Attempted to assess for patients ability to qualify for long-term care Medicaid; unfortunately, Patricia Simmons is unable to recall the patients monthly income. It is however reported the patient does not have any large assets . Discussed caregiver options with Patricia Simmons including in home PCS services, PACE programs, ALF, and SNF.  [name] Kitchen Determined Patricia Simmons is solely interested in placement at this time . Reviewed ways to apply for Medicaid with Patricia Simmons, [name] prefers to have an application mailed to her home . Outbound call to [place] (the county which the patient is reported to live); voice message left requesting guidance on how the patient's daughter can proceed with application and which SNF/ALF communities are currently accepting admissions . Discussed plans with Alfredia Ferguson for ongoing care management follow up and provided Patricia Simmons with direct contact information for care management team  Patient Self Care Activities:  . Currently UNABLE TO independently perform ADL's and iADL's due to cognition  Initial goal documentation         Follow Up Plan: SW will follow up with patient by phone over the next 10-14 days       Daneen Schick, BSW, CDP Social Worker, Certified Dementia Practitioner [place] [facility] [place] [facility] [phone]  Total time spent performing care coordination and/or care management activities with the patient by phone or face to face = 70 minutes.

## 2019-05-01 ENCOUNTER — Telehealth: Payer: Self-pay

## 2019-05-02 ENCOUNTER — Telehealth: Payer: Self-pay

## 2019-05-08 ENCOUNTER — Other Ambulatory Visit: Payer: Self-pay | Admitting: Nurse Practitioner

## 2019-05-08 ENCOUNTER — Ambulatory Visit: Payer: Self-pay

## 2019-05-08 DIAGNOSIS — F0390 Unspecified dementia without behavioral disturbance: Secondary | ICD-10-CM

## 2019-05-08 DIAGNOSIS — I10 Essential (primary) hypertension: Secondary | ICD-10-CM

## 2019-05-08 NOTE — Chronic Care Management (AMB) (Signed)
Chronic Care Management   Social Work Follow Up Note  05/08/2019 Name: Patricia Simmons MRN: [id number] DOB: [birth year]  Patricia Simmons is a 83 y.o. year old female who is a primary care patient of Patricia FeltsMoore, Janece, FNP. The CCM team was consulted for assistance with Level of Care Concerns.   Review of patient status, including review of consultants reports, other relevant assessments, and collaboration with appropriate care team members and the patient's provider was performed as part of comprehensive patient evaluation and provision of chronic care management services.     Goals Addressed            This Visit's Progress   . "I want her to have a life alert"   Not on track    Daughter Stated:  Current Barriers:  . Level of care concerns . Social Isolation . Memory Deficits . Lacks knowledge of health plan benefits  Clinical Social Work Clinical Goal(s):  [name] Kitchen. Over the next 30 days, patients daughter will become more knowledgeable of health plan benefits . Over the next 45 days, patient will work with SW to obtain a personal emergency response system through her health plan benefit  Interventions: . Outbound call to the patients daughter and caregiver Patricia Simmons . Attempted to assist Patricia Simmons in ordering a personal emergency response system for the patient through [employer] . Informed by Patricia Simmons she would like to contact the company at a later time . Provided Patricia Simmons for the contact number [phone] . Encouraged Patricia Simmons to contact [employer] in a timely manner in order to initiate the process  Patient Self Care Activities:  . Currently UNABLE TO independently care for self at home.  Please see past updates related to this goal by clicking on the "Past Updates" button in the selected goal      . "my mom needs placement"       Daughter stated:  Current Barriers:  . Financial constraints . Level of care concerns . Memory Deficits .  Inability to perform ADL's independently . Inability to perform IADL's independently . Lacks knowledge of the difference between Medicare and Medicaid . COVID 19 limiting admissions to SNF and ALF communities . Patients daughter/primary caregiver will return to work in August  Clinical Social Work Clinical Goal(s):  [name] Kitchen. Over the next 60 days, patient will work with SW to address needs related to level of care  CCM SW Interventions: Completed 05/08/19 with Patricia Peelingosemary Simmons . Outbound call to the patients daughter and POA Patricia Simmons . Determined Patricia. Simmons has yet to hear from [facility] regarding how to obtain a LTC Medicaid application . Outbound call to [facility] spoke with representative Patricia Simmons who confirms she will place an application in the mail to the patients home . Informed by Patricia Simmons the patient would need to be placed into a SNF during the application process in order to qualify for LTC Medicaid . [facility] to notify of application being mailed to the patients home . Reviewed the LTC Medicaid application process with Patricia Simmons . Educated Patricia. Simmons on how LTC Medicaid works including that the patient would no longer receive her social security income aside from approximately $40.00 due to the rest of her income going towards the cost of placement . Advised Patricia Simmons that due to the patient needing placement she would need to plan to pay a deposit and/or provide the entire patients social security deposit to the facility  while awaiting outcome of LTC Medicaid application . Assessed for continued interest in LTC placement - Patricia Simmons confirmed continued interest in LTC placement and specifically requested placement at [facility] due to the facility being in between [place] to allow for family to visit . Advised Patricia. Simmons that all facilities have a limited number of Medicaid beds and there is a  chance the patient would not be able to get placement at the desired SNF . Confirmed with Patricia Simmons she is agreeable to CCM SW seeking multiple bed offers if [facility] does not have a bed available . Collaboration with patients primary care provider to request initiation of FL-2 via in basket communication . Outbound call to [facility] to inquire if LTC Medicaid beds are available . Informed by admission coordinator Patricia Simmons who reports there are currently no LTC Medicaid beds available but that may change over the next two months . Discussed patients options for payment while awaiting approval of LTC Medicaid, Patricia Simmons reports the patient will have to privately pay at the rate of 8,300 per month until application process is completed and the patient is approved . Scheduled follow up call to the patients daughter Patricia Simmons to discuss outcome of call to [facility] in the next 7 days  Patient Self Care Activities:  . Currently UNABLE TO independently perform ADL's and iADL's due to cognition  Please see past updates related to this goal by clicking on the "Past Updates" button in the selected goal          Follow Up Plan: SW will follow up with patient by phone over the next 7 business days   Patricia Simmons, BSW, CDP Social Worker, Certified Dementia Practitioner [place] [facility] [place] [facility] [phone]  Total time spent performing care coordination and/or care management activities with the patient by phone or face to face = 45 minutes.

## 2019-05-08 NOTE — Patient Instructions (Signed)
Social Worker Visit Information  Goals we discussed today:  Goals Addressed            This Visit's Progress   . "I want her to have a life alert"   Not on track    Daughter Stated:  Current Barriers:  . Level of care concerns . Social Isolation . Memory Deficits . Lacks knowledge of health plan benefits  Clinical Social Work Clinical Goal(s):  [name] Kitchen. Over the next 30 days, patients daughter will become more knowledgeable of health plan benefits . Over the next 45 days, patient will work with SW to obtain a personal emergency response system through her health plan benefit  Interventions: . Outbound call to the patients daughter and caregiver Candida PeelingRosemary Hickman . Attempted to assist Mrs Hickman in ordering a personal emergency response system for the patient through [employer] . Informed by Mrs Hickman she would like to contact the company at a later time . Provided Mrs Hickman for the contact number [phone] . Encouraged Mrs Hickman to contact [employer] in a timely manner in order to initiate the process  Patient Self Care Activities:  . Currently UNABLE TO independently care for self at home.  Please see past updates related to this goal by clicking on the "Past Updates" button in the selected goal      . "my mom needs placement"       Daughter stated:  Current Barriers:  . Financial constraints . Level of care concerns . Memory Deficits . Inability to perform ADL's independently . Inability to perform IADL's independently . Lacks knowledge of the difference between Medicare and Medicaid . COVID 19 limiting admissions to SNF and ALF communities . Patients daughter/primary caregiver will return to work in August  Clinical Social Work Clinical Goal(s):  [name] Kitchen. Over the next 60 days, patient will work with SW to address needs related to level of care  CCM SW Interventions: Completed 05/08/19 with Candida Peelingosemary Hickman . Outbound call to the patients daughter and  POA Rosemary Hickman . Determined [name] has yet to hear from [facility] regarding how to obtain a LTC Medicaid application . Outbound call to [facility] spoke with representative Annice PihJackie who confirms she will place an application in the mail to the patients home . Informed by Annice PihJackie the patient would need to be placed into a SNF during the application process in order to qualify for LTC Medicaid . [facility] to notify of application being mailed to the patients home . Reviewed the LTC Medicaid application process with Mrs. Inda MerlinHickman . Educated [name] on how LTC Medicaid works including that the patient would no longer receive her social security income aside from approximately $40.00 due to the rest of her income going towards the cost of placement . Advised Mrs Hickman that due to the patient needing placement she would need to plan to pay a deposit and/or provide the entire patients social security deposit to the facility while awaiting outcome of LTC Medicaid application . Assessed for continued interest in LTC placement - Mrs. Inda MerlinHickman confirmed continued interest in LTC placement and specifically requested placement at [facility] [place] allow [place] family to visit . Advised [name] that all facilities have a limited number of Medicaid beds and there is a chance the patient would not be able to get placement at the desired SNF . Confirmed with Mrs. Inda MerlinHickman she  is agreeable to CCM SW seeking multiple bed offers if [facility] does not have a bed available . Collaboration with patients primary care provider to request initiation of FL-2 via in basket communication . Outbound call to [facility] to inquire if LTC Medicaid beds are available . Informed by admission coordinator Linus Orn who reports there are currently no LTC Medicaid beds available but that  may change over the next two months . Discussed patients options for payment while awaiting approval of LTC Medicaid, Linus Orn reports the patient will have to privately pay at the rate of 8,300 per month until application process is completed and the patient is approved . Scheduled follow up call to the patients daughter Alfredia Ferguson to discuss outcome of call to [facility] in the next 7 days  Patient Self Care Activities:  . Currently UNABLE TO independently perform ADL's and iADL's due to cognition  Please see past updates related to this goal by clicking on the "Past Updates" button in the selected goal          Materials Provided: Verbal education about longterm care Medicaid provided by phone  Follow Up Plan: SW will follow up with patient by phone over the next 7 business days   Daneen Schick, BSW, CDP Social Worker, Certified Dementia Practitioner [place] [facility] [place] [facility] [phone]

## 2019-05-09 ENCOUNTER — Ambulatory Visit: Payer: Self-pay | Admitting: Nurse Practitioner

## 2019-05-09 ENCOUNTER — Ambulatory Visit: Payer: Medicare Other

## 2019-05-10 ENCOUNTER — Ambulatory Visit: Payer: Self-pay

## 2019-05-10 DIAGNOSIS — F0390 Unspecified dementia without behavioral disturbance: Secondary | ICD-10-CM

## 2019-05-10 NOTE — Chronic Care Management (AMB) (Signed)
**Note Simmons via Obfuscation**   Chronic Care Management    Social Work Follow Up Note  05/10/2019 Name: Patricia Simmons MRN: [id number] DOB: February 10, 1936  Patricia Simmons is a 83 y.o. year old female who is a primary care patient of [name], [place]. Patricia CCM team was consulted for assistance with Level of Care Concerns.   Review of patient status, including review of consultants reports, other relevant assessments, and collaboration with appropriate care team members and Patricia patient's provider was performed as part of comprehensive patient evaluation and provision of chronic care management services.     Goals Addressed            This Visit's Progress   . "my mom needs placement"       Daughter stated:  Current Barriers:  . Financial constraints . Level of care concerns . Memory Deficits . Inability to perform ADL's independently . Inability to perform IADL's independently . Lacks knowledge of Patricia difference between Medicare and Medicaid . COVID 19 limiting admissions to SNF and ALF communities . Patients daughter/primary caregiver will return to work in August  [place]):  [name] Kitchen Over Patricia next 60 days, patient will work with SW to address needs related to level of care  CCM SW Interventions: Completed 05/10/19 with Alfredia Ferguson . Outbound call to Patricia patients daughter and POA Rosemary Hickman . Provided information CCM SW previously received from [facility] regarding cost of placement in this particular facility while awaiting LTC Medicaid . Determined Mrs Alfonzo Beers would be unable to cover Patricia cost of this facility . Discussed CCM SW plan to send out patients FL-2 to multiple facilities seeking a bed offer. Once a bed offer is identified, CCM SW will link to patients daughter after confirming payment options . Advised Mrs Hickman that patients provider will notify CCM SW once updated FL-2 is complete  Patient Self Care Activities:  . Currently UNABLE TO  independently perform ADL's and iADL's due to cognition  Please see past updates related to this goal by clicking on Patricia "Past Updates" button in Patricia selected goal          Follow Up Plan: SW will follow up with patient by phone over Patricia next two weeks.   Daneen Schick, BSW, CDP Social Worker, Certified Dementia Practitioner [place] [facility] [place] [facility] [phone]  Total time spent performing care coordination and/Patricia care management activities with Patricia patient by phone Patricia face to face = 12 minutes.

## 2019-05-10 NOTE — Patient Instructions (Signed)
Social Worker Visit Information  Goals we discussed today:  Goals Addressed            This Visit's Progress   . "my mom needs placement"       Daughter stated:  Current Barriers:  . Financial constraints . Level of care concerns . Memory Deficits . Inability to perform ADL's independently . Inability to perform IADL's independently . Lacks knowledge of the difference between Medicare and Medicaid . COVID 19 limiting admissions to SNF and ALF communities . Patients daughter/primary caregiver will return to work in August  [place]):  [name] Kitchen Over the next 60 days, patient will work with SW to address needs related to level of care  CCM SW Interventions: Completed 05/10/19 with Alfredia Ferguson . Outbound call to the patients daughter and POA Rosemary Hickman . Provided information CCM SW previously received from [place] regarding cost of placement in this particular facility while awaiting LTC Medicaid . Determined Mrs Alfonzo Beers would be unable to cover the cost of this facility . Discussed CCM SW plan to send out patients FL-2 to multiple facilities seeking a bed offer. Once a bed offer is identified, CCM SW will link to patients daughter after confirming payment options . Advised Mrs Hickman that patients provider will notify CCM SW once updated FL-2 is complete  Patient Self Care Activities:  . Currently UNABLE TO independently perform ADL's and iADL's due to cognition  Please see past updates related to this goal by clicking on the "Past Updates" button in the selected goal          Materials Provided: Verbal education about placement options provided by phone  Follow Up Plan: SW will follow up with patient by phone over the next two weeks   Daneen Schick, BSW, CDP Social Worker, Certified Dementia Practitioner [place] [facility] [place] [facility] [phone]

## 2019-05-15 ENCOUNTER — Encounter: Payer: Self-pay | Admitting: Nurse Practitioner

## 2019-05-15 ENCOUNTER — Ambulatory Visit (INDEPENDENT_AMBULATORY_CARE_PROVIDER_SITE_OTHER): Payer: Medicare Other

## 2019-05-15 ENCOUNTER — Ambulatory Visit (INDEPENDENT_AMBULATORY_CARE_PROVIDER_SITE_OTHER): Payer: Medicare Other | Admitting: Nurse Practitioner

## 2019-05-15 ENCOUNTER — Other Ambulatory Visit: Payer: Self-pay

## 2019-05-15 VITALS — BP 110/72 | HR 76 | Wt 155.0 lb

## 2019-05-15 VITALS — Ht 65.0 in | Wt 150.0 lb

## 2019-05-15 DIAGNOSIS — E2839 Other primary ovarian failure: Secondary | ICD-10-CM | POA: Diagnosis not present

## 2019-05-15 DIAGNOSIS — W19XXXA Unspecified fall, initial encounter: Secondary | ICD-10-CM | POA: Diagnosis not present

## 2019-05-15 DIAGNOSIS — M25552 Pain in left hip: Secondary | ICD-10-CM | POA: Diagnosis not present

## 2019-05-15 DIAGNOSIS — Z23 Encounter for immunization: Secondary | ICD-10-CM

## 2019-05-15 DIAGNOSIS — Z0000 Encounter for general adult medical examination without abnormal findings: Secondary | ICD-10-CM

## 2019-05-15 MED ORDER — BOOSTRIX 5-2.5-18.5 LF-MCG/0.5 IM SUSP: 0.5000 mL | Freq: Once | INTRAMUSCULAR | 0 refills | Status: AC

## 2019-05-15 MED ORDER — PREVNAR 13 IM SUSP: 0.5000 mL | INTRAMUSCULAR | 0 refills | Status: AC

## 2019-05-15 NOTE — Progress Notes (Signed)
Subjective:   Patricia Simmons is a 83 y.o. female who presents for Medicare Annual (Subsequent) preventive examination.  This visit type was conducted due to national recommendations for restrictions regarding the COVID-19 Pandemic (e.g. social distancing). This format is felt to be most appropriate for this patient at this time. All issues noted in this document were discussed and addressed. No physical exam was performed (except for noted visual exam findings with Video Visits). This patient, PatriciaBelva AgeeLottie Simmons and her daughter Patricia Simmons, has given permission to perform this visit via telephone. Vital signs may be absent or patient reported.  Patient location:  At home  Nurse location:  [facility] office     Review of Systems:  n/a Cardiac Risk Factors include: advanced age (>2955men, 49>65 women);hypertension;sedentary lifestyle     Objective:     Vitals: Ht 5\' 5"  (1.651 m) Comment: per daughter  Wt 150 lb (68 kg) Comment: per daughter  BMI 24.96 kg/m   Body mass index is 24.96 kg/m.  Advanced Directives 05/15/2019 04/05/2019 01/09/2019 07/30/2018  Does Patient Have a Medical Advance Directive? Yes No No Yes  Type of Advance Directive Healthcare Power of Attorney - - Healthcare Power of Attorney  Does patient want to make changes to medical advance directive? No - Patient declined - - No - Patient declined  Copy of Healthcare Power of Attorney in Chart? No - copy requested - - No - copy requested  Would patient like information on creating a medical advance directive? - - No - Patient declined -    Tobacco Social History   Tobacco Use  Smoking Status Never Smoker  Smokeless Tobacco Never Used     Counseling given: Not Answered   Clinical Intake:  Pre-visit preparation completed: Yes  Pain : 0-10 Pain Score: 10-Worst pain ever Pain Type: Chronic pain Pain Location: Hip Pain Orientation: Left Pain Radiating Towards: none Pain Descriptors / Indicators: Sharp Pain  Onset: More than a month ago Pain Frequency: Intermittent Pain Relieving Factors: tylenol and alcohol rub helps  Pain Relieving Factors: tylenol and alcohol rub helps  Nutritional Status: BMI of 19-24  Normal Nutritional Risks: None Diabetes: No  How often do you need to have someone help you when you read instructions, pamphlets, or other written materials from your doctor or pharmacy?: 1 - Never What is the last grade level you completed in school?: 5th grade  Interpreter Needed?: No  Information entered by :: NAllen LPN  Past Medical History:  Diagnosis Date  . Arthritis   . Blood transfusion without reported diagnosis   . Hypertension    Past Surgical History:  Procedure Laterality Date  . ABDOMINAL HYSTERECTOMY    . HEMORROIDECTOMY    . TUBAL LIGATION     Family History  Problem Relation Age of Onset  . Stroke Mother   . Hypertension Mother   . Prostate cancer Father   . Hypertension Sister   . Hyperlipidemia Sister   . Emphysema Brother   . Lung cancer Brother    Social History   Socioeconomic History  . Marital status: Single    Spouse name: Not on file  . Number of children: Not on file  . Years of education: Not on file  . Highest education level: Not on file  Occupational History  . Occupation: retired  Engineer, productionocial Needs  . Financial resource strain: Not very hard  . Food insecurity    Worry: Never true    Inability: Never true  .  Transportation needs    Medical: No    Non-medical: No  Tobacco Use  . Smoking status: Never Smoker  . Smokeless tobacco: Never Used  Substance and Sexual Activity  . Alcohol use: No  . Drug use: No  . Sexual activity: Not Currently  Lifestyle  . Physical activity    Days per week: 0 days    Minutes per session: 0 min  . Stress: Not at all  Relationships  . Social Musicianconnections    Talks on phone: Not on file    Gets together: Not on file    Attends religious service: Not on file    Active member of club or  organization: Not on file    Attends meetings of clubs or organizations: Not on file    Relationship status: Not on file  Other Topics Concern  . Not on file  Social History Narrative  . Not on file    Outpatient Encounter Medications as of 05/15/2019  Medication Sig  . acetaminophen (TYLENOL) 500 MG tablet Take 1,000 mg by mouth every 6 (six) hours as needed for mild pain or headache.  . citalopram (CELEXA) 10 MG tablet Take 1 tablet (10 mg total) by mouth daily.  . diclofenac sodium (VOLTAREN) 1 % GEL Apply 2 g topically 4 (four) times daily.  . furosemide (LASIX) 20 MG tablet Take 1 tablet (20 mg total) by mouth daily.  . hydrochlorothiazide (HYDRODIURIL) 12.5 MG tablet Take 2 tablets by mouth once daily  . losartan (COZAAR) 50 MG tablet Take 1 tablet (50 mg total) by mouth daily.  [name] Kitchen. omeprazole (PRILOSEC) 40 MG capsule Take 1 capsule (40 mg total) by mouth daily.  . potassium chloride (K-DUR) 10 MEQ tablet Take 1 tablet (10 mEq total) by mouth daily.  [name] Kitchen. triamcinolone cream (KENALOG) 0.1 % Apply 1 application topically 2 (two) times daily.  . pneumococcal 13-valent conjugate vaccine (PREVNAR 13) SUSP injection Inject 0.5 mLs into the muscle tomorrow at 10 am for 1 dose.  . Tdap (BOOSTRIX) 5-2.5-18.5 LF-MCG/0.5 injection Inject 0.5 mLs into the muscle once for 1 dose.   No facility-administered encounter medications on file as of 05/15/2019.     Activities of Daily Living In your present state of health, do you have any difficulty performing the following activities: 05/15/2019 07/30/2018  Hearing? Malvin JohnsY Y  Vision? Y N  Difficulty concentrating or making decisions? Y N  Comment - sometimes she is forgetful though  Walking or climbing stairs? Y Y  Dressing or bathing? Y N  Doing errands, shopping? Y -  Comment has someone with her -  Quarry managerreparing Food and eating ? Y -  Using the Toilet? N -  In the past six months, have you accidently leaked urine? Y -  Comment wears a pad -  Do you have  problems with loss of bowel control? Y -  Comment at times -  Managing your Medications? Y -  Managing your Finances? Y -  Housekeeping or managing your Housekeeping? Y -  Some recent data might be hidden    Patient Care Team: Arnette FeltsMoore, Janece, FNP as PCP - General (General Practice) Clarene DukeLittle, Karma LewAngel L, RN as Case Manager Bevelyn NgoHumble, Kendra as Social Worker    Assessment:   This is a routine wellness examination for Yu.  Exercise Activities and Dietary recommendations Current Exercise Habits: The patient does not participate in regular exercise at present  Goals    . "I want her to have a life alert"  Daughter Stated:  Current Barriers:  . Level of care concerns . Social Isolation . Memory Deficits . Lacks knowledge of health plan benefits  Clinical Social Work Clinical Goal(s):  [name] Kitchen. Over the next 30 days, patients daughter will become more knowledgeable of health plan benefits . Over the next 45 days, patient will work with SW to obtain a personal emergency response system through her health plan benefit  Interventions: . Outbound call to the patients daughter and caregiver Candida PeelingRosemary Simmons . Attempted to assist [name] in ordering a personal emergency response system for the patient through [employer] . Informed by [name] she would like to contact the company at a later time . Provided [name] for the contact number [phone] . Encouraged [name] to contact [employer] in a timely manner in order to initiate the process  Patient Self Care Activities:  . Currently UNABLE TO independently care for self at home.  Please see past updates related to this goal by clicking on the "Past Updates" button in the selected goal      . "my mom needs placement"     Daughter stated:  Current Barriers:  . Financial constraints . Level of care concerns . Memory Deficits . Inability to perform ADL's independently . Inability to perform IADL's  independently . Lacks knowledge of the difference between Medicare and Medicaid . COVID 19 limiting admissions to SNF and ALF communities . Patients daughter/primary caregiver will return to work in August  Clinical Social Work Clinical Goal(s):  [name] Kitchen. Over the next 60 days, patient will work with SW to address needs related to level of care  CCM SW Interventions: Completed 05/10/19 with Candida Peelingosemary Simmons . Outbound call to the patients daughter and POA [name] . Provided information C[facility] SW [facility] regarding cost of placement in this particular facility while awaiting LTC Medicaid . Determined Mrs Inda MerlinHickman would be unable to cover the cost of this facility . Discussed CCM SW plan to send out patients FL-2 to multiple facilities seeking a bed offer. Once a bed offer is identified, CCM SW will link to patients daughter after confirming payment options . Advised [name] that patients provider will notify CCM SW once updated FL-2 is complete  Patient Self Care Activities:  . Currently UNABLE TO independently perform ADL's and iADL's due to cognition  Please see past updates related to this goal by clicking on the "Past Updates" button in the selected goal      . Assist with Chronic Disease Management and Care Coordination needs     Current Barriers:  [name] Kitchen. Knowledge Barriers related to resources and support available to address needs related to Chronic disease management and Community Resource needs  Case Manager Clinical Goal(s):  [name] Kitchen. Over the next 30 days, patient will work with the CCM team to address needs related to Chronic Disease Management and Care Coordination needs.   Interventions:  . Collaborated with BSW and initiated plan of care to address needs related to assistance with LTC placement and chronic disease management for dementia and Essential Hypertension.   . Scheduled an initial CCM RN CM Telephone outreach to patient's  daughter and caregiver Candida PeelingRosemary Simmons   Patient Self Care Activities:  . Currently UNABLE TO independently perform self care due to advanced dementia   Initial goal documentation        Fall Risk Fall Risk  05/15/2019 05/15/2019 04/19/2019 04/03/2019 12/21/2018  Falls in the past year? 0 0  0 1 0  Number falls in past yr: - - - 0 -  Injury with Fall? - - - 0 -  Risk for fall due to : Medication side effect - - - -  Follow up Falls evaluation completed;Falls prevention discussed - - - -   Is the patient's home free of loose throw rugs in walkways, pet beds, electrical cords, etc?   yes      Grab bars in the bathroom? yes      Handrails on the stairs?   yes      Adequate lighting?   yes  Timed Get Up and Go performed: n/a  Depression Screen PHQ 2/9 Scores 05/15/2019 05/15/2019 04/19/2019 04/03/2019  PHQ - 2 Score 3 5 0 4  PHQ- 9 Score 15 14 - 18     Cognitive Function        Immunization History  Administered Date(s) Administered  . Influenza-Unspecified 08/07/2018    Qualifies for Shingles Vaccine? yes  Screening Tests Health Maintenance  Topic Date Due  . TETANUS/TDAP  03/28/1957  . DEXA SCAN  03/29/2003  . PNA vac Low Risk Adult (1 of 2 - PCV13) 03/29/2003  . INFLUENZA VACCINE  06/09/2019    Cancer Screenings: Lung: Low Dose CT Chest recommended if Age 7-80 years, 30 pack-year currently smoking OR have quit w/in 15years. Patient does not qualify. Breast:  Up to date on Mammogram? Yes   Up to date of Bone Density/Dexa? No Colorectal: not required  Additional Screenings: : Hepatitis C Screening: n/a     Plan:    6 CIT not completed. Patient has dementia.    I have personally reviewed and noted the following in the patient's chart:   . Medical and social history . Use of alcohol, tobacco or illicit drugs  . Current medications and supplements . Functional ability and status . Nutritional status . Physical activity . Advanced directives . List of other  physicians . Hospitalizations, surgeries, and ER visits in previous 12 months . Vitals . Screenings to include cognitive, depression, and falls . Referrals and appointments  In addition, I have reviewed and discussed with patient certain preventive protocols, quality metrics, and best practice recommendations. A written personalized care plan for preventive services as well as general preventive health recommendations were provided to patient.     Kellie Simmering, LPN  0/07/2329

## 2019-05-15 NOTE — Progress Notes (Signed)
Virtual Visit via Video   This visit type was conducted due to national recommendations for restrictions regarding the COVID-19 Pandemic (e.g. social distancing) in an effort to limit this patient's exposure and mitigate transmission in our community.  Due to her co-morbid illnesses, this patient is at least at moderate risk for complications without adequate follow up.  This format is felt to be most appropriate for this patient at this time.  All issues noted in this document were discussed and addressed.  A limited physical exam was performed with this format.    This visit type was conducted due to national recommendations for restrictions regarding the COVID-19 Pandemic (e.g. social distancing) in an effort to limit this patient's exposure and mitigate transmission in our community.  Patients identity confirmed using two different identifiers.  This format is felt to be most appropriate for this patient at this time.  All issues noted in this document were discussed and addressed.  No physical exam was performed (except for noted visual exam findings with Video Visits).    Date:  05/29/2019   ID:  Patricia Simmons, DOB [birth year], MRN [id number]  Patient Location:  Home [facility]   Provider location:   Office    Chief Complaint:    History of Present Illness:    Patricia Simmons is a 83 y.o. female who presents via video conferencing for a telehealth visit today.    The patient does not have symptoms concerning for COVID-19 infection (fever, chills, cough, or new shortness of breath).   Hip Pain  The incident occurred more than 1 week ago. Incident location: after a fall. The injury mechanism was a fall. The pain is present in the left hip. The quality of the pain is described as aching. Pain scale: unable to determine patient can not express. The pain has been intermittent since onset.     Past Medical History:  Diagnosis Date  . Arthritis   . Blood transfusion  without reported diagnosis   . Hypertension    Past Surgical History:  Procedure Laterality Date  . ABDOMINAL HYSTERECTOMY    . HEMORROIDECTOMY    . TUBAL LIGATION       Current Meds  Medication Sig  . acetaminophen (TYLENOL) 500 MG tablet Take 1,000 mg by mouth every 6 (six) hours as needed for mild pain or headache.  . citalopram (CELEXA) 10 MG tablet Take 1 tablet (10 mg total) by mouth daily.  . diclofenac sodium (VOLTAREN) 1 % GEL Apply 2 g topically 4 (four) times daily.  . hydrochlorothiazide (HYDRODIURIL) 12.5 MG tablet Take 2 tablets by mouth once daily  . losartan (COZAAR) 50 MG tablet Take 1 tablet (50 mg total) by mouth daily.  [name] Kitchen. omeprazole (PRILOSEC) 40 MG capsule Take 1 capsule (40 mg total) by mouth daily.  [name] Kitchen. triamcinolone cream (KENALOG) 0.1 % Apply 1 application topically 2 (two) times daily.  . [DISCONTINUED] furosemide (LASIX) 20 MG tablet Take 1 tablet (20 mg total) by mouth daily.  . [DISCONTINUED] potassium chloride (K-DUR) 10 MEQ tablet Take 1 tablet (10 mEq total) by mouth daily.     Allergies:   Codeine   Social History   Tobacco Use  . Smoking status: Never Smoker  . Smokeless tobacco: Never Used  Substance Use Topics  . Alcohol use: No  . Drug use: No     Family Hx: The patient's family history includes Emphysema in her brother; Hyperlipidemia in her sister; Hypertension in her  mother and sister; Lung cancer in her brother; Prostate cancer in her father; Stroke in her mother.  ROS:   Please see the history of present illness.    Review of Systems  Reason unable to perform ROS: per the daughter     All other systems reviewed and are negative.   Labs/Other Tests and Data Reviewed:    Recent Labs: 04/05/2019: ALT 13; Hemoglobin 12.4; Platelets 166 04/19/2019: BNP 38.6; BUN 12; Creatinine, Ser 0.79; Potassium 3.6; Sodium 138   Recent Lipid Panel No results found for: CHOL, TRIG, HDL, CHOLHDL, LDLCALC, LDLDIRECT  Wt Readings from Last 3  Encounters:  05/15/19 155 lb (70.3 kg)  05/15/19 150 lb (68 kg)  04/19/19 155 lb 6.4 oz (70.5 kg)     Exam:    Vital Signs:  BP 110/72 (BP Location: Left Arm, Patient Position: Sitting, Cuff Size: Small)   Pulse 76   Wt 155 lb (70.3 kg)   BMI 29.10 kg/m     Physical Exam  Constitutional: No distress.  Telephone visit unable to visualize  ASSESSMENT & PLAN:    1. Left hip pain  Since this pain continues to be ongoing I will refer her to an orthopedic for further evaluation at the request of the daughter Francesca Jewett - Ambulatory referral to Orthopedic Surgery  COVID-19 Education: The signs and symptoms of COVID-19 were discussed with the patient and how to seek care for testing (follow up with PCP or arrange E-visit).  The importance of social distancing was discussed today.  Patient Risk:   After full review of this patients clinical status, I feel that they are at least moderate risk at this time.  Time:   Today, I have spent 11 minutes/ seconds with the patient with telehealth technology discussing above diagnoses.     Medication Adjustments/Labs and Tests Ordered: Current medicines are reviewed at length with the patient today.  Concerns regarding medicines are outlined above.   Tests Ordered: Orders Placed This Encounter  Procedures  . Ambulatory referral to Orthopedic Surgery    Medication Changes: No orders of the defined types were placed in this encounter.   Disposition:  Follow up prn  Signed, [name], FNP

## 2019-05-15 NOTE — Patient Instructions (Signed)
Patricia Simmons , Thank you for taking time to come for your Medicare Wellness Visit. I appreciate your ongoing commitment to your health goals. Please review the following plan we discussed and let me know if I can assist you in the future.   Screening recommendations/referrals: Colonoscopy: not required Mammogram: not required Bone Density: referred Recommended yearly ophthalmology/optometry visit for glaucoma screening and checkup Recommended yearly dental visit for hygiene and checkup  Vaccinations: Influenza vaccine: 07/2018 Pneumococcal vaccine: 03/2016 Tdap vaccine: sent to pharmacy Shingles vaccine: discussed    Advanced directives: Please bring a copy of your POA (Power of [place]) and/or Living Will to your next appointment.    Conditions/risks identified: dementia  Next appointment: 05/15/2019   Preventive Care 51 Years and Older, Female Preventive care refers to lifestyle choices and visits with your health care provider that can promote health and wellness. What does preventive care include?  A yearly physical exam. This is also called an annual well check.  Dental exams once or twice a year.  Routine eye exams. Ask your health care provider how often you should have your eyes checked.  Personal lifestyle choices, including:  Daily care of your teeth and gums.  Regular physical activity.  Eating a healthy diet.  Avoiding tobacco and drug use.  Limiting alcohol use.  Practicing safe sex.  Taking low-dose aspirin every day.  Taking vitamin and mineral supplements as recommended by your health care provider. What happens during an annual well check? The services and screenings done by your health care provider during your annual well check will depend on your age, overall health, lifestyle risk factors, and family history of disease. Counseling  Your health care provider may ask you questions about your:  Alcohol use.  Tobacco use.  Drug use.  Emotional  well-being.  Home and relationship well-being.  Sexual activity.  Eating habits.  History of falls.  Memory and ability to understand (cognition).  Work and work Statistician.  Reproductive health. Screening  You may have the following tests or measurements:  Height, weight, and BMI.  Blood pressure.  Lipid and cholesterol levels. These may be checked every 5 years, or more frequently if you are over 71 years old.  Skin check.  Lung cancer screening. You may have this screening every year starting at age 38 if you have a 30-pack-year history of smoking and currently smoke or have quit within the past 15 years.  Fecal occult blood test (FOBT) of the stool. You may have this test every year starting at age 23.  Flexible sigmoidoscopy or colonoscopy. You may have a sigmoidoscopy every 5 years or a colonoscopy every 10 years starting at age 23.  Hepatitis C blood test.  Hepatitis B blood test.  Sexually transmitted disease (STD) testing.  Diabetes screening. This is done by checking your blood sugar (glucose) after you have not eaten for a while (fasting). You may have this done every 1-3 years.  Bone density scan. This is done to screen for osteoporosis. You may have this done starting at age 30.  Mammogram. This may be done every 1-2 years. Talk to your health care provider about how often you should have regular mammograms. Talk with your health care provider about your test results, treatment options, and if necessary, the need for more tests. Vaccines  Your health care provider may recommend certain vaccines, such as:  Influenza vaccine. This is recommended every year.  Tetanus, diphtheria, and acellular pertussis (Tdap, Td) vaccine. You may need a  Td booster every 10 years.  Zoster vaccine. You may need this after age 75.  Pneumococcal 13-valent conjugate (PCV13) vaccine. One dose is recommended after age 65.  Pneumococcal polysaccharide (PPSV23) vaccine. One  dose is recommended after age 47. Talk to your health care provider about which screenings and vaccines you need and how often you need them. This information is not intended to replace advice given to you by your health care provider. Make sure you discuss any questions you have with your health care provider. Document Released: 11/21/2015 Document Revised: 07/14/2016 Document Reviewed: 08/26/2015 Elsevier Interactive Patient Education  2017 [place] Prevention in the Home Falls can cause injuries. They can happen to people of all ages. There are many things you can do to make your home safe and to help prevent falls. What can I do on the outside of my home?  Regularly fix the edges of walkways and driveways and fix any cracks.  Remove anything that might make you trip as you walk through a door, such as a raised step or threshold.  Trim any bushes or trees on the path to your home.  Use bright outdoor lighting.  Clear any walking paths of anything that might make someone trip, such as rocks or tools.  Regularly check to see if handrails are loose or broken. Make sure that both sides of any steps have handrails.  Any raised decks and porches should have guardrails on the edges.  Have any leaves, snow, or ice cleared regularly.  Use sand or salt on walking paths during winter.  Clean up any spills in your garage right away. This includes oil or grease spills. What can I do in the bathroom?  Use night lights.  Install grab bars by the toilet and in the tub and shower. Do not use towel bars as grab bars.  Use non-skid mats or decals in the tub or shower.  If you need to sit down in the shower, use a plastic, non-slip stool.  Keep the floor dry. Clean up any water that spills on the floor as soon as it happens.  Remove soap buildup in the tub or shower regularly.  Attach bath mats securely with double-sided non-slip rug tape.  Do not have throw rugs and other  things on the floor that can make you trip. What can I do in the bedroom?  Use night lights.  Make sure that you have a light by your bed that is easy to reach.  Do not use any sheets or blankets that are too big for your bed. They should not hang down onto the floor.  Have a firm chair that has side arms. You can use this for support while you get dressed.  Do not have throw rugs and other things on the floor that can make you trip. What can I do in the kitchen?  Clean up any spills right away.  Avoid walking on wet floors.  Keep items that you use a lot in easy-to-reach places.  If you need to reach something above you, use a strong step stool that has a grab bar.  Keep electrical cords out of the way.  Do not use floor polish or wax that makes floors slippery. If you must use wax, use non-skid floor wax.  Do not have throw rugs and other things on the floor that can make you trip. What can I do with my stairs?  Do not leave any items on the stairs.  Make sure that there are handrails on both sides of the stairs and use them. Fix handrails that are broken or loose. Make sure that handrails are as long as the stairways.  Check any carpeting to make sure that it is firmly attached to the stairs. Fix any carpet that is loose or worn.  Avoid having throw rugs at the top or bottom of the stairs. If you do have throw rugs, attach them to the floor with carpet tape.  Make sure that you have a light switch at the top of the stairs and the bottom of the stairs. If you do not have them, ask someone to add them for you. What else can I do to help prevent falls?  Wear shoes that:  Do not have high heels.  Have rubber bottoms.  Are comfortable and fit you well.  Are closed at the toe. Do not wear sandals.  If you use a stepladder:  Make sure that it is fully opened. Do not climb a closed stepladder.  Make sure that both sides of the stepladder are locked into place.  Ask  someone to hold it for you, if possible.  Clearly mark and make sure that you can see:  Any grab bars or handrails.  First and last steps.  Where the edge of each step is.  Use tools that help you move around (mobility aids) if they are needed. These include:  Canes.  Walkers.  Scooters.  Crutches.  Turn on the lights when you go into a dark area. Replace any light bulbs as soon as they burn out.  Set up your furniture so you have a clear path. Avoid moving your furniture around.  If any of your floors are uneven, fix them.  If there are any pets around you, be aware of where they are.  Review your medicines with your doctor. Some medicines can make you feel dizzy. This can increase your chance of falling. Ask your doctor what other things that you can do to help prevent falls. This information is not intended to replace advice given to you by your health care provider. Make sure you discuss any questions you have with your health care provider. Document Released: 08/21/2009 Document Revised: 04/01/2016 Document Reviewed: 11/29/2014 Elsevier Interactive Patient Education  2017 [employer].

## 2019-05-17 ENCOUNTER — Telehealth: Payer: Self-pay

## 2019-05-17 ENCOUNTER — Ambulatory Visit: Payer: Self-pay

## 2019-05-17 DIAGNOSIS — F0390 Unspecified dementia without behavioral disturbance: Secondary | ICD-10-CM

## 2019-05-17 NOTE — Patient Instructions (Signed)
Social Worker Visit Information  Goals we discussed today:  Goals Addressed            This Visit's Progress   . "my mom needs placement"   Not on track    Daughter stated:  Current Barriers:  . Financial constraints . Level of care concerns . Memory Deficits . Inability to perform ADL's independently . Inability to perform IADL's independently . Lacks knowledge of the difference between Medicare and Medicaid . COVID 19 limiting admissions to SNF and ALF communities . Patients daughter/primary caregiver will return to work in August  [place]):  [name] Kitchen Over the next 60 days, patient will work with SW to address needs related to level of care  CCM SW Interventions: Completed 05/17/19 with Alfredia Ferguson . Collaboration with the patients primary care provider who indicates the patients FL-2 has been completed . Collaboration with long term care facilities in [facility] including [place], [place], and [place] to determine barriers for patient admission . Determined the patient would need to seek long term care placement within the current county she resides unless she is approved for a transfer between counties . Outbound call to the patients daughter Alfredia Ferguson who reports she has yet to receive the long term care application in the mail to begin application process . Outbound call to [facility] worker Herbie [place] who confirms the application was mailed to the patients home address but due to the Independence Day holiday it may be delayed . Discussed concerns for patient's daughter needing assistance with placement and challenges due to COVID . Informed the LTC Medicaid caseworkers only assist with determining eligibility and not placement . Discussed patient level of care needs regarding SNF versus ALF or memory care - informed the patients daughter was send a SNF application and that if the FL-2  indicates a different level of care the patient would need a separate application . Unsuccessful outbound call to the patients daughter to discuss above stated interventions . Collaboration with the patients provider to report barriers to placement surrounding application process as well as COVID 19 challenges . Reviewed FL-2 which indicates level of care for placement to be ALF . Collaboration via in basket message to primary provider to confirm level of care prior to sending FL-2 out for bed offers  Patient Self Care Activities:  . Currently UNABLE TO independently perform ADL's and iADL's due to cognition  Please see past updates related to this goal by clicking on the "Past Updates" button in the selected goal          Materials Provided: Verbal education about LTC Medicaid application provided by phone  Follow Up Plan: SW will follow up with patient by phone over the next 3 weeks  Daneen Schick, BSW, CDP Social Worker, Certified Dementia Practitioner [place] [facility] [place] [facility] [phone]

## 2019-05-17 NOTE — Chronic Care Management (AMB) (Signed)
  Chronic Care Management   Social Work Follow Up Note  05/17/2019 Name: Patricia Simmons MRN: [id number] DOB: November 18, 1935  Patricia Simmons is a 83 y.o. year old female who is a primary care patient of [name], [place]. The CCM team was consulted for assistance with Level of Care Concerns.   Review of patient status, including review of consultants reports, other relevant assessments, and collaboration with appropriate care team members and the patient's provider was performed as part of comprehensive patient evaluation and provision of chronic care management services.     Goals Addressed            This Visit's Progress   . "my mom needs placement"   Not on track    Daughter stated:  Current Barriers:  . Financial constraints . Level of care concerns . Memory Deficits . Inability to perform ADL's independently . Inability to perform IADL's independently . Lacks knowledge of the difference between Medicare and Medicaid . COVID 19 limiting admissions to SNF and ALF communities . Patients daughter/primary caregiver will return to work in August  [place]):  [name] Kitchen Over the next 60 days, patient will work with SW to address needs related to level of care  CCM SW Interventions: Completed 05/17/19 with Alfredia Ferguson . Collaboration with the patients primary care provider who indicates the patients FL-2 has been completed . Collaboration with long term care facilities in [facility] including [place], [place], and [place] to determine barriers for patient admission . Determined the patient would need to seek long term care placement within the current county she resides unless she is approved for a transfer between counties . Outbound call to the patients daughter Alfredia Ferguson who reports she has yet to receive the long term care application in the mail to begin application process . Outbound call to [facility]  worker Herbie [place] who confirms the application was mailed to the patients home address but due to the Independence Day holiday it may be delayed . Discussed concerns for patient's daughter needing assistance with placement and challenges due to COVID . Informed the LTC Medicaid caseworkers only assist with determining eligibility and not placement . Discussed patient level of care needs regarding SNF versus ALF or memory care - informed the patients daughter was send a SNF application and that if the FL-2 indicates a different level of care the patient would need a separate application . Unsuccessful outbound call to the patients daughter to discuss above stated interventions . Collaboration with the patients provider to report barriers to placement surrounding application process as well as COVID 19 challenges . Reviewed FL-2 which indicates level of care for placement to be ALF . Collaboration via in basket message to primary provider to confirm level of care prior to sending FL-2 out for bed offers  Patient Self Care Activities:  . Currently UNABLE TO independently perform ADL's and iADL's due to cognition  Please see past updates related to this goal by clicking on the "Past Updates" button in the selected goal          Follow Up Plan: SW will follow up with patient by phone over the next 3 weeks   Daneen Schick, BSW, CDP Social Worker, Certified Dementia Practitioner [place] [facility] [place] [facility] [phone]  Total time spent performing care coordination and/or care management activities with the patient by phone or face to face = 60 minutes.

## 2019-05-17 NOTE — Telephone Encounter (Signed)
Patient's FL2 forms have been completed and faxed to [email or web address] on 05/17/19. YRL,RMA

## 2019-05-21 ENCOUNTER — Other Ambulatory Visit: Payer: Self-pay | Admitting: Nurse Practitioner

## 2019-05-21 DIAGNOSIS — R600 Localized edema: Secondary | ICD-10-CM

## 2019-05-22 ENCOUNTER — Ambulatory Visit: Payer: Self-pay

## 2019-05-22 DIAGNOSIS — F0390 Unspecified dementia without behavioral disturbance: Secondary | ICD-10-CM

## 2019-05-22 DIAGNOSIS — I10 Essential (primary) hypertension: Secondary | ICD-10-CM

## 2019-05-23 ENCOUNTER — Ambulatory Visit: Payer: Self-pay

## 2019-05-23 DIAGNOSIS — F0390 Unspecified dementia without behavioral disturbance: Secondary | ICD-10-CM

## 2019-05-23 DIAGNOSIS — I10 Essential (primary) hypertension: Secondary | ICD-10-CM

## 2019-05-23 NOTE — Patient Instructions (Signed)
Social Worker Visit Information  Goals we discussed today:  Goals Addressed            This Visit's Progress   . "my mom needs placement"       Daughter stated:  Current Barriers:  . Financial constraints . Level of care concerns . Memory Deficits . Inability to perform ADL's independently . Inability to perform IADL's independently . Lacks knowledge of the difference between Medicare and Medicaid . COVID 19 limiting admissions to SNF and ALF communities . Patients daughter/primary caregiver will return to work in August  [place]):  [name] Kitchen Over the next 60 days, patient will work with SW to address needs related to level of care  CCM SW Interventions: Completed 05/22/19 with Alfredia Ferguson . Collaboration with the patients primary care provider who indicates the level of care needed in "Memory Care" . Requested the patients primary care provider update the current FL-2 to reflect appropriate level of care . Outbound call to a memory care community in [place] to determine no Medicaid memory care beds currently unavailable with a wait list . Outbound call to the patients daughter to assess progression of Medicaid Application . Informed by the patients daughter she has yet to receive application in the mail . Educated Mrs Hickman on current barriers to finding long term care placement due to limited number of memory care communities who accept Medicaid . Advised Mrs Hickman CCM SW would plan to fax current FL-2 out to several communities within [facility] in hopes of receiving a bed offer and/or placing the patient on a waiting list . Confirmed patient mailing address . Collaboration with [facility] [place] worker Herbie Baltimore to request another application be mailed to the patients home address for completion . Received appropriate application from Herbie Baltimore via e-mail correspondence . Mailed [name] an application to her  home address for completion on the patients behalf . Received updated FL-2 from the patients providers . Communicated with the provider above mentioned plan to send FL-2 to multiple placement options in hopes of locating a bed offer  Patient Self Care Activities:  . Currently UNABLE TO independently perform ADL's and iADL's due to cognition  Please see past updates related to this goal by clicking on the "Past Updates" button in the selected goal          Materials Provided: Yes: Mailed Medicaid application  Follow Up Plan: SW will follow up with patient by phone over the next two weeks   Daneen Schick, BSW, CDP Social Worker, Certified Dementia Practitioner [place] [facility] [place] [facility] [phone]

## 2019-05-23 NOTE — Chronic Care Management (AMB) (Signed)
  Chronic Care Management   Social Work Follow Up Note  05/22/2019 Name: MCCARTNEY BRUCKS MRN: [id number] DOB: Jul 24, 1936  Patricia Simmons is a 83 y.o. year old female who is a primary care patient of [name], [place]. The CCM team was consulted for assistance with Level of Care Concerns.   Review of patient status, including review of consultants reports, other relevant assessments, and collaboration with appropriate care team members and the patient's provider was performed as part of comprehensive patient evaluation and provision of chronic care management services.     Goals Addressed            This Visit's Progress   . "my mom needs placement"       Daughter stated:  Current Barriers:  . Financial constraints . Level of care concerns . Memory Deficits . Inability to perform ADL's independently . Inability to perform IADL's independently . Lacks knowledge of the difference between Medicare and Medicaid . COVID 19 limiting admissions to SNF and ALF communities . Patients daughter/primary caregiver will return to work in August  [place]):  [name] Kitchen Over the next 60 days, patient will work with SW to address needs related to level of care  CCM SW Interventions: Completed 05/22/19 with Alfredia Ferguson . Collaboration with the patients primary care provider who indicates the level of care needed in "Memory Care" . Requested the patients primary care provider update the current FL-2 to reflect appropriate level of care . Outbound call to a memory care community in [place] to determine no Medicaid memory care beds currently unavailable with a wait list . Outbound call to the patients daughter to assess progression of Medicaid Application . Informed by the patients daughter she has yet to receive application in the mail . Educated Mrs Hickman on current barriers to finding long term care placement due to limited number of memory care communities who  accept Medicaid . Advised Mrs Hickman CCM SW would plan to fax current FL-2 out to several communities within [facility] in hopes of receiving a bed offer and/or placing the patient on a waiting list . Confirmed patient mailing address . Collaboration with [place] worker Herbie Baltimore to request another application be mailed to the patients home address for completion . Received appropriate application from Herbie Baltimore via e-mail correspondence . Mailed [name] an application to her home address for completion on the patients behalf . Received updated FL-2 from the patients providers . Communicated with the provider above mentioned plan to send FL-2 to multiple placement options in hopes of locating a bed offer  Patient Self Care Activities:  . Currently UNABLE TO independently perform ADL's and iADL's due to cognition  Please see past updates related to this goal by clicking on the "Past Updates" button in the selected goal          Follow Up Plan: CCM SW will send out FL-2 to communities over the next day. CCM SW will follow up with the patients daughter Alfredia Ferguson over the next two weeks  Daneen Schick, BSW, CDP Social Worker, Certified Dementia Practitioner [place] [facility] [place] [facility] [phone]  Total time spent performing care coordination and/or care management activities with the patient by phone or face to face = 65 minutes.

## 2019-05-24 NOTE — Chronic Care Management (AMB) (Signed)
  Chronic Care Management   Social Work Follow Up Note  05/23/2019 Name: Patricia Simmons MRN: [id number] DOB: Mar 18, 1936  Patricia Simmons is a 83 y.o. year old female who is a primary care patient of [name], [place]. The CCM team was consulted for assistance with Level of Care Concerns.   Review of patient status, including review of consultants reports, other relevant assessments, and collaboration with appropriate care team members and the patient's provider was performed as part of comprehensive patient evaluation and provision of chronic care management services.     Goals Addressed            This Visit's Progress   . "my mom needs placement"       Daughter stated:  Current Barriers:  . Financial constraints . Level of care concerns . Memory Deficits . Inability to perform ADL's independently . Inability to perform IADL's independently . Lacks knowledge of the difference between Medicare and Medicaid . COVID 19 limiting admissions to SNF and ALF communities . Patients daughter/primary caregiver will return to work in August  [place]):  [name] Kitchen Over the next 60 days, patient will work with SW to address needs related to level of care  CCM SW Interventions: Completed 05/23/19 with Alfredia Ferguson . Obtained list of Memory Care communities within [facility] . Sent out FL-2 to the following communities seeking bed offer: o [place] at the [place] [facility] [place] and [facility] . Received in bound call from Anderson with [employer] at the [facility] who indicated they are unable to extend a bed offer due to plans of closing and recently selling all Medicare and Medicaid bed certificates. Coralyn Mark further reported she is unaware of other  Memory Care communities to refer the patient to at this time   Patient Self Care Activities:  . Currently UNABLE TO independently perform ADL's and iADL's due to cognition  Please see past updates related to this goal by clicking on the "Past Updates" button in the selected goal          Follow Up Plan: SW will follow up with patient by phone over the next two weeks   Daneen Schick, BSW, CDP Social Worker, Certified Dementia Practitioner [place] [facility] [place] [facility] [phone]  Total time spent performing care coordination and/or care management activities with the patient by phone or face to face = 25 minutes.

## 2019-05-24 NOTE — Patient Instructions (Signed)
Social Worker Visit Information  Goals we discussed today:  Goals Addressed            This Visit's Progress   . "my mom needs placement"       Daughter stated:  Current Barriers:  . Financial constraints . Level of care concerns . Memory Deficits . Inability to perform ADL's independently . Inability to perform IADL's independently . Lacks knowledge of the difference between Medicare and Medicaid . COVID 19 limiting admissions to SNF and ALF communities . Patients daughter/primary caregiver will return to work in August  [place]):  [name] Kitchen Over the next 60 days, patient will work with SW to address needs related to level of care  CCM SW Interventions: Completed 05/23/19 with Alfredia Ferguson . Obtained list of Memory Care communities within [facility] . Sent out FL-2 to the following communities seeking bed offer: o [place] at the [place] [facility] [place] and [facility] . Received in bound call from [place] with [employer] at the [facility] who indicated they are unable to extend a bed offer due to plans of closing and recently selling all Medicare and Medicaid bed certificates. Coralyn Mark further reported she is unaware of other Memory Care communities to refer the patient to at this time   Patient Self Care Activities:  . Currently UNABLE TO independently perform ADL's and iADL's due to cognition  Please see past updates related to this goal by clicking on the "Past Updates" button in the selected goal          Materials Provided: No. Patient not reached.  Follow Up Plan: SW will follow up with patient by phone over the next two weeks   Daneen Schick, BSW, CDP Social Worker, Certified Dementia Practitioner [place] [facility] [place]  Management [phone]

## 2019-05-25 ENCOUNTER — Telehealth: Payer: Self-pay

## 2019-05-25 ENCOUNTER — Other Ambulatory Visit: Payer: Self-pay | Admitting: Nurse Practitioner

## 2019-05-25 ENCOUNTER — Ambulatory Visit: Payer: Self-pay

## 2019-05-25 DIAGNOSIS — F0390 Unspecified dementia without behavioral disturbance: Secondary | ICD-10-CM

## 2019-05-25 DIAGNOSIS — I10 Essential (primary) hypertension: Secondary | ICD-10-CM

## 2019-05-25 DIAGNOSIS — R600 Localized edema: Secondary | ICD-10-CM

## 2019-05-25 NOTE — Chronic Care Management (AMB) (Signed)
  Chronic Care Management    Social Work Follow Up Note  05/25/2019 Name: Patricia Simmons MRN: [id number] DOB: 13-Aug-1936  Patricia Simmons is a 83 y.o. year old female who is a primary care patient of [name], [place]. The CCM team was consulted for assistance with Level of Care Concerns.   Review of patient status, including review of consultants reports, other relevant assessments, and collaboration with appropriate care team members and the patient's provider was performed as part of comprehensive patient evaluation and provision of chronic care management services.     Goals Addressed            This Visit's Progress   . "my mom needs placement"   Not on track    Daughter stated:  Current Barriers:  . Financial constraints . Level of care concerns . Memory Deficits . Inability to perform ADL's independently . Inability to perform IADL's independently . Lacks knowledge of the difference between Medicare and Medicaid . COVID 19 limiting admissions to SNF and ALF communities . Patients daughter/primary caregiver will return to work in August  [place]):  [name] Kitchen Over the next 60 days, patient will work with SW to address needs related to level of care  CCM SW Interventions: Completed 05/25/19 with Alfredia Ferguson . Collaboration with Melody from [facility] who reports having 1 female bed available in a semi-private room. Melody quotes the room at 22 and indicates the patient would have to come in as private pay until her Medicaid is approved . Communicated to [place] felt this cost would be out of the patients price range but would have patients POA contact [facility] if interested . Outbound call to [facility] to report bed offer . Concluded the patient would be unable to pay privately upon admission . Assessed for alternate caregivers to assist with patient care- Francesca Jewett reports "there's nobody else" . Determined the patients POA  and sole caregiver has a return to work date planned for June 25 2019 at this time . Reviewed alternate option of the patient attending a PACE program to assist with caregiver burden while at work . Francesca Jewett reports "I need her to go somewhere and stay. I can't do this much longer" . Acknowledged caregiver burnout . Explained to Francesca Jewett that unfortunately the patient may not be placed by her return to work date due to several barriers including Medicaid application not yet being completed and COVID 19 restrictions with room availability . Encouraged Francesca Jewett to complete Medicaid application as soon as it is received in the mail . Scheduled outbound call in the next two week   Patient Self Care Activities:  . Currently UNABLE TO independently perform ADL's and iADL's due to cognition  Please see past updates related to this goal by clicking on the "Past Updates" button in the selected goal          Follow Up Plan: SW will follow up with patient by phone over the next two weeks.   Daneen Schick, BSW, CDP Social Worker, Certified Dementia Practitioner [place] / [place] [facility] [phone]  Total time spent performing care coordination and/or care management activities with the patient by phone or face to face = 20 minutes.

## 2019-05-25 NOTE — Patient Instructions (Signed)
Social Worker Visit Information  Goals we discussed today:  Goals Addressed            This Visit's Progress   . "my mom needs placement"   Not on track    Daughter stated:  Current Barriers:  . Financial constraints . Level of care concerns . Memory Deficits . Inability to perform ADL's independently . Inability to perform IADL's independently . Lacks knowledge of the difference between Medicare and Medicaid . COVID 19 limiting admissions to SNF and ALF communities . Patients daughter/primary caregiver will return to work in August  [place]):  [name] Kitchen Over the next 60 days, patient will work with SW to address needs related to level of care  CCM SW Interventions: Completed 05/25/19 with Alfredia Ferguson . Collaboration with Melody from [facility] who reports having 1 female bed available in a semi-private room. Melody quotes the room at 84 and indicates the patient would have to come in as private pay until her Medicaid is approved . Communicated to [place] felt this cost would be out of the patients price range but would have patients POA contact [facility] if interested . Outbound call to [facility] to report bed offer . Concluded the patient would be unable to pay privately upon admission . Assessed for alternate caregivers to assist with patient care- Francesca Jewett reports "there's nobody else" . Determined the patients POA and sole caregiver has a return to work date planned for June 25 2019 at this time . Reviewed alternate option of the patient attending a PACE program to assist with caregiver burden while at work . Francesca Jewett reports "I need her to go somewhere and stay. I can't do this much longer" . Acknowledged caregiver burnout . Explained to Francesca Jewett that unfortunately the patient may not be placed by her return to work date due to several barriers including Medicaid application not yet being completed and COVID 19 restrictions  with room availability . Encouraged Francesca Jewett to complete Medicaid application as soon as it is received in the mail . Scheduled outbound call in the next two week   Patient Self Care Activities:  . Currently UNABLE TO independently perform ADL's and iADL's due to cognition  Please see past updates related to this goal by clicking on the "Past Updates" button in the selected goal          Materials Provided: No: Patient declined  Follow Up Plan: SW will follow up with patient by phone over the next two weeks  Daneen Schick, BSW, CDP Social Worker, Certified Dementia Practitioner [place] [facility] [place] [facility] [phone]

## 2019-05-29 ENCOUNTER — Encounter: Payer: Self-pay | Admitting: Nurse Practitioner

## 2019-06-07 ENCOUNTER — Ambulatory Visit: Payer: Self-pay

## 2019-06-07 DIAGNOSIS — I10 Essential (primary) hypertension: Secondary | ICD-10-CM

## 2019-06-07 DIAGNOSIS — F0390 Unspecified dementia without behavioral disturbance: Secondary | ICD-10-CM

## 2019-06-07 NOTE — Patient Instructions (Signed)
Social Worker Visit Information  Goals we discussed today:  Goals Addressed            This Visit's Progress   . "my mom needs placement"   Not on track    Daughter stated:  Current Barriers:  . Financial constraints . Level of care concerns . Memory Deficits . Inability to perform ADL's independently . Inability to perform IADL's independently . Lacks knowledge of the difference between Medicare and Medicaid . COVID 19 limiting admissions to SNF and ALF communities . Patients daughter/primary caregiver will return to work in August  Clinical Social Work Clinical Goal(s):  [name] Kitchen. Over the next 60 days, patient will work with SW to address needs related to level of care  CCM SW Interventions: Completed 06/07/19 with Patricia Simmons . Outbound call to the patients daughter Patricia Simmons to assess progression of patient goal . Confirmed receipt of mailed long term care Medicaid application . Determined this application has yet to be initiated . Informed by the patients daughter "my mom needs placement. I will have to go to work soon" . Reviewed previous interventions as well as barriers to meeting this goal . Reminded Patricia Simmons that unfortunately all communities accepting patients from home are requiring costs up front considering the patient is not currently covered under Medicaid . Re-assessed Patricia Simmons ability to cover these costs up front - Patricia Simmons continues to report se is unable to afford this option . Advised Patricia Simmons to complete the Medicaid application and return to DSS as soon as possible as the patient can not move forward with placement until this is completed . Scheduled follow up call over the next week to assess status of Medicaid application   Patient Self Care Activities:  . Currently UNABLE TO independently perform ADL's and iADL's due to cognition  Please see past updates related to this goal by clicking on the "Past Updates" button in the selected  goal      . "she is sleeping a lot and talking to people who aren't there"       Daughter stated:  Current Barriers:  . Unknown cause of reported symptoms  Clinical Social Work Clinical Goal(s):  [name] Kitchen. Over the next 6 days, patient will be seen by her primary provider to assess concerns related to sleep pattern and hallucinations  CCM SW Interventions: Completed 06/07/19 with daughter Patricia Simmons . Daughter interviewed to obtain information . Determined the patient has been sleeping a lot and seeing both children and men having conversations with them "I told my mom I can't see them and maybe it is God calling her home" . Assessed for more information - it is reported the patient has been talking to, laughing with, and at times scared of hallucinations "I don't know what they are trying to do to me". Patricia Simmons states this has gone on approximately two weeks.  . Educated Patricia Simmons on disease process as well as other factors such as UTI or alternate infection . Assessed for other patient symptoms- Patricia Simmons denies other changes including fever . Discussed following up with the patients provider for full assessment of reported symptoms . Scheduled office visit for the patient to see her primary provider on Monday August 3rd . Collaboration with patients primary provider to notify of patient reported symptoms and upcomming office visit . Scheduled follow up call to Patricia Simmons within the next week   Patient Self Care Activities:  . Currently UNABLE TO independently care for self.  Patient is cared for by her daughter . Daughter/caregiver Patricia Simmons reports understanding of scheduled appointment   Initial goal documentation         Follow Up Plan: SW will follow up with patient by phone over the next week.   Patricia Simmons, BSW, CDP Social Worker, Certified Dementia Practitioner [place] / [place] [facility] [phone]

## 2019-06-07 NOTE — Chronic Care Management (AMB) (Signed)
Chronic Care Management   Social Work Follow Up Note  06/07/2019 Name: Patricia Simmons MRN: [id number] DOB: 1935-11-24  Patricia Simmons is a 83 y.o. year old female who is a primary care patient of [name], [place]. The CCM team was consulted for assistance with [employer] .   Review of patient status, including review of consultants reports, other relevant assessments, and collaboration with appropriate care team members and the patient's provider was performed as part of comprehensive patient evaluation and provision of chronic care management services.     Goals Addressed            This Visit's Progress   . "my mom needs placement"   Not on track    Daughter stated:  Current Barriers:  . Financial constraints . Level of care concerns . Memory Deficits . Inability to perform ADL's independently . Inability to perform IADL's independently . Lacks knowledge of the difference between Medicare and Medicaid . COVID 19 limiting admissions to SNF and ALF communities . Patients daughter/primary caregiver will return to work in August  [place]):  [name] Kitchen Over the next 60 days, patient will work with SW to address needs related to level of care  CCM SW Interventions: Completed 06/07/19 with Alfredia Ferguson . Outbound call to the patients daughter Alfredia Ferguson to assess progression of patient goal . Confirmed receipt of mailed long term care Medicaid application . Determined this application has yet to be initiated . Informed by the patients daughter "my mom needs placement. I will have to go to work soon" . Reviewed previous interventions as well as barriers to meeting this goal . Reminded Mrs Hickman that unfortunately all communities accepting patients from home are requiring costs up front considering the patient is not currently covered under Medicaid . Re-assessed [name] to cover these costs up front - Mrs Hickman continues to  report se is unable to afford this option . Advised [name] to complete the Medicaid application and return to DSS as soon as possible as the patient can not move forward with placement until this is completed . Scheduled follow up call over the next week to assess status of Medicaid application   Patient Self Care Activities:  . Currently UNABLE TO independently perform ADL's and iADL's due to cognition  Please see past updates related to this goal by clicking on the "Past Updates" button in the selected goal      . "she is sleeping a lot and talking to people who aren't there"       Daughter stated:  Current Barriers:  . Unknown cause of reported symptoms  Clinical Social Work Clinical Goal(s):  [name] Kitchen Over the next 6 days, patient will be seen by her primary provider to assess concerns related to sleep pattern and hallucinations  CCM SW Interventions: Completed 06/07/19 with daughter Alfredia Ferguson . Daughter interviewed to obtain information . Determined the patient has been sleeping a lot and seeing both children and men having conversations with them "I told my mom I can't see them and maybe it is God calling her home" . Assessed for more information - it is reported the patient has been talking to, laughing with, and at times scared of hallucinations "I don't know what they are trying to do to me". [name] states this has gone on approximately two weeks.  . Educated [name] on disease process as well as other factors such as UTI or alternate infection .  Assessed for other patient symptoms- Mrs Hickman denies other changes including fever . Discussed following up with the patients provider for full assessment of reported symptoms . Scheduled office visit for the patient to see her primary provider on Monday August 3rd . Collaboration with patients primary provider to notify of patient reported symptoms and upcomming office visit . Scheduled follow up call to Mrs Hickman  within the next week   Patient Self Care Activities:  . Currently UNABLE TO independently care for self. Patient is cared for by her daughter . Daughter/caregiver Candida Peelingosemary Hickman reports understanding of scheduled appointment   Initial goal documentation         Follow Up Plan: SW will follow up with patient by phone over the next week.   Bevelyn NgoKendra Deneane Stifter, BSW, CDP Social Worker, Certified Dementia Practitioner [facility] [phone]  Total time spent performing care coordination and/or care management activities with the patient by phone or face to face = 30 minutes.

## 2019-06-11 ENCOUNTER — Encounter: Payer: Self-pay | Admitting: Nurse Practitioner

## 2019-06-11 ENCOUNTER — Ambulatory Visit (INDEPENDENT_AMBULATORY_CARE_PROVIDER_SITE_OTHER): Payer: Medicare Other | Admitting: Nurse Practitioner

## 2019-06-11 ENCOUNTER — Other Ambulatory Visit: Payer: Self-pay

## 2019-06-11 VITALS — BP 132/80 | HR 72 | Temp 98.4°F | Ht 61.0 in | Wt 148.4 lb

## 2019-06-11 DIAGNOSIS — R443 Hallucinations, unspecified: Secondary | ICD-10-CM | POA: Diagnosis not present

## 2019-06-11 DIAGNOSIS — F0390 Unspecified dementia without behavioral disturbance: Secondary | ICD-10-CM

## 2019-06-11 DIAGNOSIS — M25552 Pain in left hip: Secondary | ICD-10-CM

## 2019-06-11 DIAGNOSIS — R350 Frequency of micturition: Secondary | ICD-10-CM

## 2019-06-11 MED ORDER — QUETIAPINE FUMARATE 25 MG PO TABS: 25.0000 mg | ORAL_TABLET | Freq: Every day | ORAL | 2 refills | Status: DC

## 2019-06-11 NOTE — Progress Notes (Signed)
Subjective:     Patient ID: Patricia Simmons , female    DOB: Apr 19, 1936 , 83 y.o.   MRN: [id number]   Chief Complaint  Patient presents with  . Hallucinations    patient states she has been hallucinating for the past 3-4 months she would be in her room talking to her self and ask her daughter if she sees the ppl in her room  . Hip Pain    patient states her left hip has been hurting her. it has been about 2 years since the pain has started    HPI  Daughter reports the patient is having more visual hallucinations.  Recently they have been more bothersome.  She is noticing it more when she goes back to the room it is more.  She will see children and men.  She is urinating in her shoes and other objects.    Wt Readings from Last 3 Encounters: 06/11/19 : 148 lb 6.4 oz (67.3 kg) 05/15/19 : 155 lb (70.3 kg) 05/15/19 : 150 lb (68 kg)  She is eating well.  She threw her teeth away accidentally.      Past Medical History:  Diagnosis Date  . Arthritis   . Blood transfusion without reported diagnosis   . Hypertension      Family History  Problem Relation Age of Onset  . Stroke Mother   . Hypertension Mother   . Prostate cancer Father   . Hypertension Sister   . Hyperlipidemia Sister   . Emphysema Brother   . Lung cancer Brother      Current Outpatient Medications:  .  acetaminophen (TYLENOL) 500 MG tablet, Take 1,000 mg by mouth every 6 (six) hours as needed for mild pain or headache., Disp: , Rfl:  .  citalopram (CELEXA) 10 MG tablet, Take 1 tablet (10 mg total) by mouth daily., Disp: 90 tablet, Rfl: 1 .  diclofenac sodium (VOLTAREN) 1 % GEL, Apply 2 g topically 4 (four) times daily., Disp: 1 Tube, Rfl: 2 .  furosemide (LASIX) 20 MG tablet, Take 1 tablet by mouth once daily, Disp: 30 tablet, Rfl: 0 .  hydrochlorothiazide (HYDRODIURIL) 12.5 MG tablet, Take 2 tablets by mouth once daily, Disp: 180 tablet, Rfl: 0 .  losartan (COZAAR) 50 MG tablet, Take 1 tablet (50 mg total) by  mouth daily., Disp: 90 tablet, Rfl: 1 .  omeprazole (PRILOSEC) 40 MG capsule, Take 1 capsule (40 mg total) by mouth daily., Disp: 90 capsule, Rfl: 1 .  potassium chloride (K-DUR) 10 MEQ tablet, Take 1 tablet by mouth once daily, Disp: 30 tablet, Rfl: 0 .  triamcinolone cream (KENALOG) 0.1 %, Apply 1 application topically 2 (two) times daily., Disp: 30 g, Rfl: 0   Allergies  Allergen Reactions  . Codeine     unknown     Review of Systems  Constitutional: Negative.   Respiratory: Negative.   Cardiovascular: Negative.   Endocrine: Negative for polydipsia, polyphagia and polyuria.  Genitourinary: Positive for frequency. Negative for dysuria.  Neurological: Negative.  Negative for dizziness and headaches.     Today's Vitals   06/11/19 1421  BP: 132/80  Pulse: 72  Temp: 98.4 F (36.9 C)  TempSrc: Oral  Weight: 148 lb 6.4 oz (67.3 kg)  Height: 5\' 1"  (1.549 m)  PainSc: 10-Worst pain ever  PainLoc: Hip   Body mass index is 28.04 kg/m.   Objective:  Physical Exam Vitals signs reviewed.  Constitutional:      Appearance: Normal appearance.  Cardiovascular:     Rate and Rhythm: Normal rate and regular rhythm.     Pulses: Normal pulses.     Heart sounds: Normal heart sounds. No murmur.  Pulmonary:     Effort: Pulmonary effort is normal.     Breath sounds: Normal breath sounds.  Musculoskeletal:        General: Tenderness (left hip ) present.  Skin:    General: Skin is warm and dry.     Capillary Refill: Capillary refill takes less than 2 seconds.  Neurological:     Mental Status: She is alert.         Assessment And Plan:     1. Hallucination  Worsening hallucinations with her voiding on different objects, she is also seeing children and men in her room  I will start her on Seroquel once a day to see if this helps.  She is to return in 4-6 weeks for new medication follow up - QUEtiapine (SEROQUEL) 25 MG tablet; Take 1 tablet (25 mg total) by mouth daily.   Dispense: 30 tablet; Refill: 2  2. Increased frequency of urination  Will check for urinary tract infection - QUEtiapine (SEROQUEL) 25 MG tablet; Take 1 tablet (25 mg total) by mouth daily.  Dispense: 30 tablet; Refill: 2  3. Left hip pain  She has an appt with orthopedics tomorrow  I have advised her daughter to ensure she is on time for the appt  4. Dementia without behavioral disturbance, unspecified dementia type ([place])  Chronic  This seems to be worsening with hallucinations  Her daughter is to complete the medicaid application and she is on a waiting list for placement   [name], FNP    THE PATIENT IS ENCOURAGED TO PRACTICE SOCIAL DISTANCING DUE TO THE COVID-19 PANDEMIC.

## 2019-06-12 DIAGNOSIS — M7062 Trochanteric bursitis, left hip: Secondary | ICD-10-CM | POA: Diagnosis not present

## 2019-06-12 DIAGNOSIS — M25552 Pain in left hip: Secondary | ICD-10-CM | POA: Diagnosis not present

## 2019-06-13 LAB — POCT URINALYSIS DIPSTICK
Bilirubin, UA: NEGATIVE
Blood, UA: NEGATIVE
Glucose, UA: NEGATIVE
Ketones, UA: NEGATIVE
Leukocytes, UA: NEGATIVE
Nitrite, UA: NEGATIVE
Protein, UA: NEGATIVE
Spec Grav, UA: 1.025 (ref 1.010–1.025)
Urobilinogen, UA: 2 E.U./dL — AB
pH, UA: 7 (ref 5.0–8.0)

## 2019-06-14 ENCOUNTER — Ambulatory Visit: Payer: Self-pay

## 2019-06-14 ENCOUNTER — Telehealth: Payer: Self-pay

## 2019-06-14 DIAGNOSIS — F0390 Unspecified dementia without behavioral disturbance: Secondary | ICD-10-CM

## 2019-06-14 NOTE — Chronic Care Management (AMB) (Signed)
  Chronic Care Management   Social Work Note  06/14/2019 Name: Patricia Simmons MRN: [id number] DOB: 01/08/1936  Patricia Simmons is a 83 y.o. year old female who sees [name], [place] for primary care. The CCM team was consulted for assistance with Level of Care Concerns.   I placed an unsuccessful outbound call to the patient's daughter and caregiver Alfredia Ferguson to assess progression of patient goals. CCM SW left a HIPAA compliant voice message requesting a return call.  Follow Up Plan: SW will follow up with patient by phone over the next 10 days.  Daneen Schick, BSW, CDP Social Worker, Certified Dementia Practitioner [place] / [place] [facility] [phone]

## 2019-06-19 ENCOUNTER — Ambulatory Visit: Payer: Self-pay

## 2019-06-19 ENCOUNTER — Telehealth: Payer: Self-pay | Admitting: Nurse Practitioner

## 2019-06-19 DIAGNOSIS — F0390 Unspecified dementia without behavioral disturbance: Secondary | ICD-10-CM

## 2019-06-19 NOTE — Telephone Encounter (Signed)
PT DAUGHTER KATHY Lemen CALLED IN REQ SOCIAL WORKER #, INFO WAS PROVIDED, ALSO LET PT DAUGHTER KNOW THAT A DPR NEEDS TO BE SIGNED FOR  HIPAA PRIVACY POLICY, ADV THAT I WILL HAVE FORM PREPARED IF SOMEONE WANTED TO STOP BY OFC TO COMPLETE

## 2019-06-19 NOTE — Chronic Care Management (AMB) (Signed)
  Chronic Care Management   Social Work Note  06/19/2019 Name: JENNETTE LEASK MRN: [id number] DOB: Dec 14, 1935  CCM SW received an inbound call from the patients daughter Karlyn Glasco whom is not listed on a patient DPR as an authorized representative. CCM SW provided active listening to Mrs. Owens Shark who requested assistance with patient placement. CCM SW explained that without the ability to privately pay for SNF and/or memory care placement the family would need to initiate a long-term care Medicaid application. CCM SW encouraged Mrs. Yepez to work with her sister on this application as this is the next step for patient placement.  At the end of today's call, CCM SW collaborated with CCM RN Case Manager to discuss barriers with placement surrounding the families lack of motivation to complete the Medicaid application and the COVID 19 pandemic limiting admissions. The CCM team feels the patient may benefit from home health orders for a community social worker to visit the home and assess for barriers and assist with application completion.  Follow Up Plan: CCM SW collaborated with patients primary provider via in basket message requesting home health orders. CCM SW will follow up with the patients family over the next two weeks.  Daneen Schick, BSW, CDP Social Worker, Certified Dementia Practitioner [place] [facility] [place] [facility] [phone]

## 2019-06-22 ENCOUNTER — Telehealth: Payer: Self-pay

## 2019-06-28 ENCOUNTER — Telehealth: Payer: Self-pay

## 2019-06-29 ENCOUNTER — Ambulatory Visit: Payer: Self-pay

## 2019-06-29 DIAGNOSIS — F0390 Unspecified dementia without behavioral disturbance: Secondary | ICD-10-CM

## 2019-06-29 NOTE — Patient Instructions (Signed)
Social Worker Visit Information  Goals we discussed today:  Goals Addressed            This Visit's Progress   . COMPLETED: "I want her to have a life alert"       Daughter Stated:  Current Barriers:  . Level of care concerns . Social Isolation . Memory Deficits . Lacks knowledge of health plan benefits  Clinical Social Work Clinical Goal(s):  [name] Kitchen Over the next 30 days, patients daughter will become more knowledgeable of health plan benefits . Over the next 45 days, patient will work with SW to obtain a personal emergency response system through her health plan benefit  Interventions: . Outbound call to the patients daughter and caregiver Patricia Simmons to assess progression of patient goal . Determined Patricia Simmons has yet to order the patient an emergency response system and does not wish for SW intervention at this time to assist with goal completion  Patient Self Care Activities:  . Currently UNABLE TO independently care for self at home.  Please see past updates related to this goal by clicking on the "Past Updates" button in the selected goal      . "my mom needs placement"   Not on track    Daughter stated:  Current Barriers:  . Financial constraints . Level of care concerns . Memory Deficits . Inability to perform ADL's independently . Inability to perform IADL's independently . Lacks knowledge of the difference between Medicare and Medicaid . COVID 19 limiting admissions to SNF and ALF communities . Patients daughter/primary caregiver will return to work in August  [place]):  [name] Kitchen Over the next 60 days, patient will work with SW to address needs related to level of care  Not met . 06/29/2019- Over the next two weeks Patricia Simmons will speak with her sister about ordering a home health SW to assist with completion of LTC Medicaid application . 06/29/2019- Over the next 30 days Patricia Simmons will review information on local adult day  centers as an option for the patient care needs  CCM SW Interventions: Completed 06/29/19 with Patricia Simmons . Outbound call to the patients daughter Patricia Simmons to assess progression of patient goal . Informed by Patricia Simmons she has yet to complete the LTC Medicaid application . Educated Patricia Simmons on the option to have home health ordered for a SW to come in the home to assist with application completion . Informed by Patricia Simmons she would speak with her sister about this due to the fact the patient is currently staying with her sister during the week and Patricia Simmons only on weekends due to work schedule . Advised Patricia Simmons to speak with her sister and that if home health orders are desired to contact CCM team or the patients provider. Also requested Patricia Simmons provide her sisters contact information as well as home address if home health is desired . Educated Patricia Simmons on alternate options for placement including adult day centers . Assessed for interest in learning more about day centers "you can send me stuff and I'll look at it with my daughter" . Mailed Patricia Simmons information on local adult day centers as an option for the patient while Patricia Simmons is at work . Scheduled follow up call to Patricia Simmons over the next four weeks to confirm receipt of mailing and assist with resources as desired   Patient Self Care Activities:  . Currently UNABLE TO independently perform ADL's and  iADL's due to cognition  Please see past updates related to this goal by clicking on the "Past Updates" button in the selected goal      . COMPLETED: "she is sleeping a lot and talking to people who aren't there"       Daughter stated:  Current Barriers:  . Unknown cause of reported symptoms  Clinical Social Work Clinical Goal(s):  [name] Kitchen Over the next 6 days, patient will be seen by her primary provider to assess concerns related to sleep pattern and hallucinations Goal met  CCM SW  Interventions: . Patient was seen by provider as planned  Patient Self Care Activities:  . Currently UNABLE TO independently care for self. Patient is cared for by her daughter . Daughter/caregiver Patricia Simmons reports understanding of scheduled appointment   Initial goal documentation         Materials Provided: Yes: mailed brochures for adult day programs  Follow Up Plan: SW will follow up with patient by phone over the next month   Daneen Schick, BSW, CDP Social Worker, Certified Dementia Practitioner [place] [facility] [phone]

## 2019-06-29 NOTE — Chronic Care Management (AMB) (Signed)
Chronic Care Management   Social Work Follow Up Note  06/29/2019 Name: Patricia Simmons MRN: [id number] DOB: May 14, 1936  Patricia Simmons is a 83 y.o. year old female who is a primary care patient of [name], [place]. The CCM team was consulted for assistance with [employer]  and Level of Care Concerns.   Review of patient status, including review of consultants reports, other relevant assessments, and collaboration with appropriate care team members and the patient's provider was performed as part of comprehensive patient evaluation and provision of chronic care management services.     Outpatient Encounter Medications as of 06/29/2019  Medication Sig  . acetaminophen (TYLENOL) 500 MG tablet Take 1,000 mg by mouth every 6 (six) hours as needed for mild pain or headache.  . citalopram (CELEXA) 10 MG tablet Take 1 tablet (10 mg total) by mouth daily.  . diclofenac sodium (VOLTAREN) 1 % GEL Apply 2 g topically 4 (four) times daily.  . furosemide (LASIX) 20 MG tablet Take 1 tablet by mouth once daily  . hydrochlorothiazide (HYDRODIURIL) 12.5 MG tablet Take 2 tablets by mouth once daily  . losartan (COZAAR) 50 MG tablet Take 1 tablet (50 mg total) by mouth daily.  [name] Kitchen omeprazole (PRILOSEC) 40 MG capsule Take 1 capsule (40 mg total) by mouth daily.  . potassium chloride (K-DUR) 10 MEQ tablet Take 1 tablet by mouth once daily  . QUEtiapine (SEROQUEL) 25 MG tablet Take 1 tablet (25 mg total) by mouth daily.  [name] Kitchen triamcinolone cream (KENALOG) 0.1 % Apply 1 application topically 2 (two) times daily.   No facility-administered encounter medications on file as of 06/29/2019.      Goals Addressed            This Visit's Progress   . COMPLETED: "I want her to have a life alert"       Daughter Stated:  Current Barriers:  . Level of care concerns . Social Isolation . Memory Deficits . Lacks knowledge of health plan benefits  Clinical Social Work Clinical Goal(s):  [name] Kitchen Over the next 30  days, patients daughter will become more knowledgeable of health plan benefits . Over the next 45 days, patient will work with SW to obtain a personal emergency response system through her health plan benefit  Interventions: . Outbound call to the patients daughter and caregiver Alfredia Ferguson to assess progression of patient goal . Determined Patricia Simmons has yet to order the patient an emergency response system and does not wish for SW intervention at this time to assist with goal completion  Patient Self Care Activities:  . Currently UNABLE TO independently care for self at home.  Please see past updates related to this goal by clicking on the "Past Updates" button in the selected goal      . "my mom needs placement"   Not on track    Daughter stated:  Current Barriers:  . Financial constraints . Level of care concerns . Memory Deficits . Inability to perform ADL's independently . Inability to perform IADL's independently . Lacks knowledge of the difference between Medicare and Medicaid . COVID 19 limiting admissions to SNF and ALF communities . Patients daughter/primary caregiver will return to work in August  [place]):  [name] Kitchen Over the next 60 days, patient will work with SW to address needs related to level of care  Not met . 06/29/2019- Over the next two weeks Patricia Simmons will speak with her sister about ordering a home  health SW to assist with completion of LTC Medicaid application . 06/29/2019- Over the next 30 days Patricia Simmons will review information on local adult day centers as an option for the patient care needs  CCM SW Interventions: Completed 06/29/19 with Alfredia Ferguson . Outbound call to the patients daughter Alfredia Ferguson to assess progression of patient goal . Informed by Patricia Simmons she has yet to complete the LTC Medicaid application . Educated Patricia Simmons on the option to have home health ordered for a SW to come in the home to  assist with application completion . Informed by Patricia Simmons she would speak with her sister about this due to the fact the patient is currently staying with her sister during the week and Patricia Simmons only on weekends due to work schedule . Advised Patricia Simmons to speak with her sister and that if home health orders are desired to contact CCM team or the patients provider. Also requested Patricia Simmons provide her sisters contact information as well as home address if home health is desired . Educated Patricia Simmons on alternate options for placement including adult day centers . Assessed for interest in learning more about day centers "you can send me stuff and I'll look at it with my daughter" . Mailed Patricia Simmons information on local adult day centers as an option for the patient while Patricia Simmons is at work . Scheduled follow up call to Patricia Simmons over the next four weeks to confirm receipt of mailing and assist with resources as desired   Patient Self Care Activities:  . Currently UNABLE TO independently perform ADL's and iADL's due to cognition  Please see past updates related to this goal by clicking on the "Past Updates" button in the selected goal      . COMPLETED: "she is sleeping a lot and talking to people who aren't there"       Daughter stated:  Current Barriers:  . Unknown cause of reported symptoms  Clinical Social Work Clinical Goal(s):  [name] Kitchen Over the next 6 days, patient will be seen by her primary provider to assess concerns related to sleep pattern and hallucinations Goal met  CCM SW Interventions: . Patient was seen by provider as planned  Patient Self Care Activities:  . Currently UNABLE TO independently care for self. Patient is cared for by her daughter . Daughter/caregiver Alfredia Ferguson reports understanding of scheduled appointment   Initial goal documentation         Follow Up Plan: SW will follow up with patient by phone over the next month.    Daneen Schick, BSW, CDP Social Worker, Certified Dementia Practitioner [place] [facility] [phone]  Total time spent performing care coordination and/or care management activities with the patient by phone or face to face = 15 minutes.

## 2019-07-02 ENCOUNTER — Other Ambulatory Visit: Payer: Self-pay

## 2019-07-02 DIAGNOSIS — I10 Essential (primary) hypertension: Secondary | ICD-10-CM

## 2019-07-02 MED ORDER — LOSARTAN POTASSIUM 50 MG PO TABS: 50.0000 mg | ORAL_TABLET | Freq: Every day | ORAL | 1 refills | Status: DC

## 2019-07-06 ENCOUNTER — Telehealth: Payer: Self-pay

## 2019-07-17 ENCOUNTER — Ambulatory Visit: Payer: Medicare Other | Admitting: Nurse Practitioner

## 2019-07-18 ENCOUNTER — Encounter: Payer: Self-pay | Admitting: Nurse Practitioner

## 2019-07-18 ENCOUNTER — Ambulatory Visit (INDEPENDENT_AMBULATORY_CARE_PROVIDER_SITE_OTHER): Payer: Medicare Other | Admitting: Nurse Practitioner

## 2019-07-18 ENCOUNTER — Other Ambulatory Visit: Payer: Self-pay

## 2019-07-18 VITALS — BP 130/82 | HR 84 | Temp 98.9°F | Ht 60.8 in | Wt 153.8 lb

## 2019-07-18 DIAGNOSIS — F0390 Unspecified dementia without behavioral disturbance: Secondary | ICD-10-CM

## 2019-07-18 DIAGNOSIS — R443 Hallucinations, unspecified: Secondary | ICD-10-CM

## 2019-07-18 DIAGNOSIS — Z23 Encounter for immunization: Secondary | ICD-10-CM

## 2019-07-18 NOTE — Progress Notes (Signed)
Subjective:     Patient ID: Patricia Simmons , female    DOB: May 01, 1936 , 83 y.o.   MRN: [id number]   Chief Complaint  Patient presents with  . Hallucinations    patient presents today for a med check. she was started on seroquel    HPI  Here for follow up for hallucinations was started on seroquel.  She is taking her seroquel in morning.  Sometimes will hallucinate a little bit but not as bad as before. She is sleeping more during the day. She is sleeping well at night as well per her daughter.   She has been staying with her other daughter Patricia Simmons during the day. Not staying at home by herself anymore.      Past Medical History:  Diagnosis Date  . Arthritis   . Blood transfusion without reported diagnosis   . Hypertension      Family History  Problem Relation Age of Onset  . Stroke Mother   . Hypertension Mother   . Prostate cancer Father   . Hypertension Sister   . Hyperlipidemia Sister   . Emphysema Brother   . Lung cancer Brother      Current Outpatient Medications:  .  acetaminophen (TYLENOL) 500 MG tablet, Take 1,000 mg by mouth every 6 (six) hours as needed for mild pain or headache., Disp: , Rfl:  .  citalopram (CELEXA) 10 MG tablet, Take 1 tablet (10 mg total) by mouth daily., Disp: 90 tablet, Rfl: 1 .  diclofenac sodium (VOLTAREN) 1 % GEL, Apply 2 g topically 4 (four) times daily., Disp: 1 Tube, Rfl: 2 .  furosemide (LASIX) 20 MG tablet, Take 1 tablet by mouth once daily, Disp: 30 tablet, Rfl: 0 .  hydrochlorothiazide (HYDRODIURIL) 12.5 MG tablet, Take 2 tablets by mouth once daily, Disp: 180 tablet, Rfl: 0 .  losartan (COZAAR) 50 MG tablet, Take 1 tablet (50 mg total) by mouth daily., Disp: 90 tablet, Rfl: 1 .  omeprazole (PRILOSEC) 40 MG capsule, Take 1 capsule (40 mg total) by mouth daily., Disp: 90 capsule, Rfl: 1 .  potassium chloride (K-DUR) 10 MEQ tablet, Take 1 tablet by mouth once daily, Disp: 30 tablet, Rfl: 0 .  QUEtiapine (SEROQUEL) 25 MG tablet,  Take 1 tablet (25 mg total) by mouth daily., Disp: 30 tablet, Rfl: 2 .  triamcinolone cream (KENALOG) 0.1 %, Apply 1 application topically 2 (two) times daily., Disp: 30 g, Rfl: 0   Allergies  Allergen Reactions  . Codeine     unknown     Review of Systems  Respiratory: Negative.   Neurological: Negative for dizziness and headaches.  Psychiatric/Behavioral: Positive for hallucinations (improved per daughter). Negative for agitation and behavioral problems. The patient is not nervous/anxious.      Today's Vitals   07/18/19 1440  BP: 130/82  Pulse: 84  Temp: 98.9 F (37.2 C)  TempSrc: Oral  Weight: 153 lb 12.8 oz (69.8 kg)  Height: 5' 0.8" (1.544 m)  PainSc: 0-No pain   Body mass index is 29.25 kg/m.   Objective:  Physical Exam Constitutional:      Appearance: Normal appearance.  Cardiovascular:     Rate and Rhythm: Normal rate and regular rhythm.  Skin:    General: Skin is warm and dry.     Capillary Refill: Capillary refill takes less than 2 seconds.  Neurological:     General: No focal deficit present.     Mental Status: She is alert and oriented to  person, place, and time.  Psychiatric:        Mood and Affect: Mood normal.        Behavior: Behavior normal.        Thought Content: Thought content normal.        Judgment: Judgment normal.         Assessment And Plan:     1. Dementia without behavioral disturbance, unspecified dementia type (HCC)  Chronic, stable  She is able to have short conversation  She is not staying at home alone any longer will go to the daughters house [name]) during the week  Continues to stay with [name] on the weekends  I have advised Coy SaunasRosemary to complete the medicaid form to assist with getting the patient placed.   2. Hallucination  Improved with seroquel and tolerating well  Will not make any changes this visit  3. Need for influenza vaccination  Influenza vaccine given in office  Advised to take Tylenol as  needed for muscle aches or fever - Flu vaccine HIGH DOSE PF (Fluzone High dose)   [name], FNP    THE PATIENT IS ENCOURAGED TO PRACTICE SOCIAL DISTANCING DUE TO THE COVID-19 PANDEMIC.

## 2019-07-18 NOTE — Patient Instructions (Signed)
Influenza Virus Vaccine (Flucelvax) What is this medicine? INFLUENZA VIRUS VACCINE (in floo EN zuh VAHY ruhs vak SEEN) helps to reduce the risk of getting influenza also known as the flu. The vaccine only helps protect you against some strains of the flu. This medicine may be used for other purposes; ask your health care provider or pharmacist if you have questions. COMMON BRAND NAME(S): FLUCELVAX What should I tell my health care provider before I take this medicine? They need to know if you have any of these conditions:  bleeding disorder like hemophilia  fever or infection  Guillain-Barre syndrome or other neurological problems  immune system problems  infection with the human immunodeficiency virus (HIV) or AIDS  low blood platelet counts  multiple sclerosis  an unusual or allergic reaction to influenza virus vaccine, other medicines, foods, dyes or preservatives  pregnant or trying to get pregnant  breast-feeding How should I use this medicine? This vaccine is for injection into a muscle. It is given by a health care professional. A copy of Vaccine Information Statements will be given before each vaccination. Read this sheet carefully each time. The sheet may change frequently. Talk to your pediatrician regarding the use of this medicine in children. Special care may be needed. Overdosage: If you think you've taken too much of this medicine contact a poison control center or emergency room at once. Overdosage: If you think you have taken too much of this medicine contact a poison control center or emergency room at once. NOTE: This medicine is only for you. Do not share this medicine with others. What if I miss a dose? This does not apply. What may interact with this medicine?  chemotherapy or radiation therapy  medicines that lower your immune system like etanercept, anakinra, infliximab, and adalimumab  medicines that treat or prevent blood clots like  warfarin  phenytoin  steroid medicines like prednisone or cortisone  theophylline  vaccines This list may not describe all possible interactions. Give your health care provider a list of all the medicines, herbs, non-prescription drugs, or dietary supplements you use. Also tell them if you smoke, drink alcohol, or use illegal drugs. Some items may interact with your medicine. What should I watch for while using this medicine? Report any side effects that do not go away within 3 days to your doctor or health care professional. Call your health care provider if any unusual symptoms occur within 6 weeks of receiving this vaccine. You may still catch the flu, but the illness is not usually as bad. You cannot get the flu from the vaccine. The vaccine will not protect against colds or other illnesses that may cause fever. The vaccine is needed every year. What side effects may I notice from receiving this medicine? Side effects that you should report to your doctor or health care professional as soon as possible:  allergic reactions like skin rash, itching or hives, swelling of the face, lips, or tongue Side effects that usually do not require medical attention (Report these to your doctor or health care professional if they continue or are bothersome.):  fever  headache  muscle aches and pains  pain, tenderness, redness, or swelling at the injection site  tiredness This list may not describe all possible side effects. Call your doctor for medical advice about side effects. You may report side effects to FDA at 1-800-FDA-1088. Where should I keep my medicine? The vaccine will be given by a health care professional in a clinic, pharmacy, doctor's   office, or other health care setting. You will not be given vaccine doses to store at home. NOTE: This sheet is a summary. It may not cover all possible information. If you have questions about this medicine, talk to your doctor, pharmacist, or  health care provider.  2020 Elsevier/Gold Standard (2011-10-06 14:06:47)  

## 2019-07-19 ENCOUNTER — Ambulatory Visit: Payer: Self-pay

## 2019-07-19 ENCOUNTER — Telehealth: Payer: Self-pay

## 2019-07-19 DIAGNOSIS — I10 Essential (primary) hypertension: Secondary | ICD-10-CM

## 2019-07-19 DIAGNOSIS — F0390 Unspecified dementia without behavioral disturbance: Secondary | ICD-10-CM

## 2019-07-19 NOTE — Chronic Care Management (AMB) (Signed)
  Chronic Care Management   Social Work Note  07/19/2019 Name: TULEEN MANDELBAUM MRN: [id number] DOB: 03-15-1936  CCM SW placed an unsuccessful outbound call to the patients daughter to assess progression of patient goals. SW left a HIPAA compliant voice message requesting a return call.   Follow Up Plan: SW will follow up with patient by phone over the next 10 days  Daneen Schick, BSW, CDP Social Worker, Certified Dementia Practitioner [place] [facility] [phone]

## 2019-07-23 ENCOUNTER — Telehealth: Payer: Self-pay | Admitting: Nurse Practitioner

## 2019-07-23 NOTE — Telephone Encounter (Signed)
Spoke with Mee Hives via phone expressing her concern about her mother going to a nursing home. She states "she will be keeping her mother with her at home 5 days a week and goes to Rosemary's house 2 days a week".  She has been down to the [place] to try and get documentation stating she is the primary caregiver.  She informs me her mom has not had any incontinence episodes or falls since she has been with her the last month.  She is concerned about her mother not having new clothes, she has also been in contact with social services in [place] about Francesca Jewett leaving the patient in the car earlier this summer and a bystander brought her into the store.  I have explained to [name] to make sure to keep [place] informed on who is her HCPOA.  At this time she is not interested in placement.

## 2019-07-26 ENCOUNTER — Ambulatory Visit: Payer: Self-pay

## 2019-07-26 DIAGNOSIS — I10 Essential (primary) hypertension: Secondary | ICD-10-CM

## 2019-07-26 DIAGNOSIS — F0390 Unspecified dementia without behavioral disturbance: Secondary | ICD-10-CM

## 2019-07-26 NOTE — Patient Instructions (Signed)
Social Worker Visit Information  Goals we discussed today:  Goals Addressed            This Visit's Progress   . COMPLETED: "my mom needs placement"       Daughter stated:  Current Barriers:  . Financial constraints . Level of care concerns . Memory Deficits . Inability to perform ADL's independently . Inability to perform IADL's independently . Lacks knowledge of the difference between Medicare and Medicaid . COVID 19 limiting admissions to SNF and ALF communities . Patients daughter/primary caregiver will return to work in August  [place]):  [name] Kitchen Over the next 60 days, patient will work with SW to address needs related to level of care  Not met . 06/29/2019- Over the next two weeks [name] will speak with her sister about ordering a home health SW to assist with completion of LTC Medicaid application . 06/29/2019- Over the next 30 days [name] will review information on local adult day centers as an option for the patient care needs  CCM SW Interventions: Completed 07/26/19 with [name] . Performed chart review to determine the patients daughter Kynslei Art contacted the practice to report no desire in placement at this time . Outbound call to the patients daughter Jahliyah Trice whom is listed on the patients updated DPR . Confirmed the patient will no need long term placement at this time . Assessed for current caregiver plan: The patient stays with [facility] Sunday night-Friday night and goes to her other daughters home Alfredia Ferguson Friday-Sunday) . Encouraged [name] to contact SW for future resource needs   Patient Self Care Activities:  . Currently UNABLE TO independently perform ADL's and iADL's due to cognition  Please see past updates related to this goal by clicking on the "Past Updates" button in the selected goal          Follow Up Plan: No further SW follow up planned at this time.   Daneen Schick, BSW, CDP Social  Worker, Certified Dementia Practitioner [place] [facility] [phone]

## 2019-07-26 NOTE — Chronic Care Management (AMB) (Signed)
Chronic Care Management   Social Work Follow Up Note  07/26/2019 Name: Patricia Simmons MRN: [id number] DOB: 04-01-1936  Patricia Simmons is a 83 y.o. year old female who is a primary care patient of Patricia Simmons, [place]. The CCM team was consulted for assistance with Caregiver Stress.   Review of patient status, including review of consultants reports, other relevant assessments, and collaboration with appropriate care team members and the patient's provider was performed as part of comprehensive patient evaluation and provision of chronic care management services.    I placed an outreach call to the patients daughter Patricia Simmons whom is the current caregiver to the patient and listed on the patients recent DPR dated 06/20/2019.  Outpatient Encounter Medications as of 07/26/2019  Medication Sig  . acetaminophen (TYLENOL) 500 MG tablet Take 1,000 mg by mouth every 6 (six) hours as needed for mild pain or headache.  . citalopram (CELEXA) 10 MG tablet Take 1 tablet (10 mg total) by mouth daily.  . diclofenac sodium (VOLTAREN) 1 % GEL Apply 2 g topically 4 (four) times daily.  . furosemide (LASIX) 20 MG tablet Take 1 tablet by mouth once daily  . hydrochlorothiazide (HYDRODIURIL) 12.5 MG tablet Take 2 tablets by mouth once daily  . losartan (COZAAR) 50 MG tablet Take 1 tablet (50 mg total) by mouth daily.  [name] Kitchen omeprazole (PRILOSEC) 40 MG capsule Take 1 capsule (40 mg total) by mouth daily.  . potassium chloride (K-DUR) 10 MEQ tablet Take 1 tablet by mouth once daily  . QUEtiapine (SEROQUEL) 25 MG tablet Take 1 tablet (25 mg total) by mouth daily.  [name] Kitchen triamcinolone cream (KENALOG) 0.1 % Apply 1 application topically 2 (two) times daily.   No facility-administered encounter medications on file as of 07/26/2019.      Goals Addressed            This Visit's Progress   . COMPLETED: "my mom needs placement"       Daughter stated:  Current Barriers:  . Financial constraints . Level of care concerns  . Memory Deficits . Inability to perform ADL's independently . Inability to perform IADL's independently . Lacks knowledge of the difference between Medicare and Medicaid . COVID 19 limiting admissions to SNF and ALF communities . Patients daughter/primary caregiver will return to work in August  Patricia Simmons):  [name] Kitchen Over the next 60 days, patient will work with SW to address needs related to level of care  Not met . 06/29/2019- Over the next two weeks Mrs. Patricia Simmons will speak with her sister about ordering a home health SW to assist with completion of LTC Medicaid application . 06/29/2019- Over the next 30 days Mrs. Patricia Simmons will review information on local adult day centers as an option for the patient care needs  CCM SW Interventions: Completed 07/26/19 with Patricia Simmons . Performed chart review to determine the patients daughter Patricia Simmons contacted the practice to report no desire in placement at this time . Outbound call to the patients daughter Patricia Simmons whom is listed on the patients updated DPR . Confirmed the patient will no need long term placement at this time . Assessed for current caregiver plan: The patient stays with Patricia Simmons Sunday night-Friday night and goes to her other daughters home Patricia Simmons Friday-Sunday) . Encouraged Patricia Simmons to contact SW for future resource needs   Patient Self Care Activities:  . Currently UNABLE TO independently perform ADL's and iADL's due to cognition  Please see past  updates related to this goal by clicking on the "Past Updates" button in the selected goal          Follow Up Plan: No planned follow up at this time. The patient will be followed by RN Case Manager for disease management.   Patricia Simmons, BSW, CDP Social Worker, Certified Dementia Practitioner [place] [facility] [place] [facility] [phone]  Total time spent performing care coordination and/or care management activities with the patient by phone or face  to face = 26 minutes.

## 2019-07-31 ENCOUNTER — Telehealth: Payer: Self-pay

## 2019-08-03 ENCOUNTER — Ambulatory Visit (INDEPENDENT_AMBULATORY_CARE_PROVIDER_SITE_OTHER): Payer: Medicare Other

## 2019-08-03 DIAGNOSIS — I10 Essential (primary) hypertension: Secondary | ICD-10-CM

## 2019-08-03 DIAGNOSIS — E876 Hypokalemia: Secondary | ICD-10-CM

## 2019-08-03 DIAGNOSIS — F0390 Unspecified dementia without behavioral disturbance: Secondary | ICD-10-CM

## 2019-08-03 NOTE — Patient Instructions (Signed)
Visit Information  Goals Addressed    . COMPLETED: Assist with Chronic Disease Management and Care Coordination needs       Current Barriers:  [name] Kitchen Knowledge Barriers related to resources and support available to address needs related to Chronic disease management and Community Resource needs  Case Manager Clinical Goal(s):  [name] Kitchen Over the next 30 days, patient will work with the CCM team to address needs related to Chronic Disease Management and Care Coordination needs.   Interventions:  . Collaborated with BSW and initiated plan of care to address needs related to assistance with LTC placement and chronic disease management for dementia and Essential Hypertension.   . Scheduled an initial CCM RN CM Telephone outreach to patient's daughter and caregiver Alfredia Ferguson   Patient Self Care Activities:  . Currently UNABLE TO independently perform self care due to advanced dementia   Initial goal documentation     . I am not sure if mother is taking Seroquel       Daughter [name] stated Current Barriers:  [name] Kitchen Knowledge Deficits related to Medication Management and familiarly of patient's prescribed medication regimen . Dementia  Nurse Case Manager Clinical Goal(s):  [name] Kitchen Over the next 30 days, patient's daughter [name] will work with the CCM team to address needs related to increasing knowledge and understanding of patients Medication regimen including the indication, dosage and frequency of each medication    CCM RN CM Interventions:  08/03/19 call completed with patient's daughter Gurnoor Sloop  . Evaluation of current medication regimen and patient's adherence to plan as established by provider  . Reviewed medications with patient and discussed patient is prescribed to take Seroquel for hallucinations secondary to Dementia - daughter is unaware if patient is taking this medication; dtr unable to complete med recon at this time; agrees to completing a comprehensive medication review at next follow  up call  . Discussed [name] is administering patients medications during the weekdays when Ms. Szymborski is in her care; sister Kalman Shan assists with med admin on Saturday & Sunday when Ms. Gritz is in sister Rose's care . Discussed plans with patient for ongoing care management follow up and provided patient with direct contact information for care management team  Patient Self Care Activities:  . Currently UNABLE TO independently perform self care  Initial goal documentation     . I haven't noticed any Hallucinations       Daughter [name] stated Current Barriers:  [name] Kitchen Knowledge Deficits related to Dementia disease process and knowing what to expect with disease progression  . Cognitive Deficits  Nurse Case Manager Clinical Goal(s):  [name] Kitchen Over the next 30 days, patient will work with PCP provider to address needs related to Neurology referral for evaluation and treatment of Dementia  CCM RN CM Interventions:  08/03/19 call completed with patients daughter Alezandra Egli   . Evaluation of current treatment plan related to Dementia and patient's adherence to plan as established by provider. . Provided education to patient re: Dementia diagnosis, disease process and treatment management; discussed daughters knowledge deficit related to what type of Dementia and best treatment options; discussed patient may benefit from Neurology evaluation  . Collaborated with provider [name], FNP  regarding request for Neurology referral to further evaluate and treat patient's Dementia . Discussed plans with patient for ongoing care management follow up and provided patient with direct contact information for care management team . Provided patient with printed educational materials related to Dementia   Patient Self Care Activities:  .  Currently UNABLE TO independently perform Self Care  Initial goal documentation        The patient verbalized understanding of instructions provided today and declined a print  copy of patient instruction materials.   Telephone follow up appointment with care management team member scheduled for: 09/04/19  Delsa Sale, RN, BSN, CCM Care Management Coordinator [facility]  Direct Phone: [phone]

## 2019-08-03 NOTE — Chronic Care Management (AMB) (Signed)
Chronic Care Management   Initial Visit Note  08/03/2019 Name: Patricia Simmons MRN: [id number] DOB: 02-05-1936  Referred by: [name], FNP Reason for referral : Chronic Care Management (CCM RNCM Telephone Outreach )   Patricia Simmons is a 83 y.o. year old female who is a primary care patient of [name], [place]. The CCM team was consulted for assistance with chronic disease management and care coordination needs.   Review of patient status, including review of consultants reports, relevant laboratory and other test results, and collaboration with appropriate care team members and the patient's provider was performed as part of comprehensive patient evaluation and provision of chronic care management services.    SDOH (Social Determinants of Health) screening performed today: None. See Care Plan for related entries.   Advanced Directives Status: N See Care Plan and Vynca application for related entries.   I spoke with patient's daughter Patricia Simmons by telephone today to assess for CCM RN needs and a care plan was established.   Medications: Outpatient Encounter Medications as of 08/03/2019  Medication Sig  . acetaminophen (TYLENOL) 500 MG tablet Take 1,000 mg by mouth every 6 (six) hours as needed for mild pain or headache.  . citalopram (CELEXA) 10 MG tablet Take 1 tablet (10 mg total) by mouth daily.  . diclofenac sodium (VOLTAREN) 1 % GEL Apply 2 g topically 4 (four) times daily.  . furosemide (LASIX) 20 MG tablet Take 1 tablet by mouth once daily  . hydrochlorothiazide (HYDRODIURIL) 12.5 MG tablet Take 2 tablets by mouth once daily  . losartan (COZAAR) 50 MG tablet Take 1 tablet (50 mg total) by mouth daily.  [name] Kitchen omeprazole (PRILOSEC) 40 MG capsule Take 1 capsule (40 mg total) by mouth daily.  . potassium chloride (K-DUR) 10 MEQ tablet Take 1 tablet by mouth once daily  . QUEtiapine (SEROQUEL) 25 MG tablet Take 1 tablet (25 mg total) by mouth daily.  [name] Kitchen triamcinolone cream  (KENALOG) 0.1 % Apply 1 application topically 2 (two) times daily.   No facility-administered encounter medications on file as of 08/03/2019.      Objective: No results found for: HGBA1C Lab Results  Component Value Date   CREATININE 0.79 04/19/2019   BP Readings from Last 3 Encounters:  07/18/19 130/82  06/11/19 132/80  05/15/19 110/72     Goals Addressed    . COMPLETED: Assist with Chronic Disease Management and Care Coordination needs       Current Barriers:  [name] Kitchen Knowledge Barriers related to resources and support available to address needs related to Chronic disease management and Community Resource needs  Case Manager Clinical Goal(s):  [name] Kitchen Over the next 30 days, patient will work with the CCM team to address needs related to Chronic Disease Management and Care Coordination needs.   Interventions:  . Collaborated with BSW and initiated plan of care to address needs related to assistance with LTC placement and chronic disease management for dementia and Essential Hypertension.   . Scheduled an initial CCM RN CM Telephone outreach to patient's daughter and caregiver Alfredia Ferguson   Patient Self Care Activities:  . Currently UNABLE TO independently perform self care due to advanced dementia   Initial goal documentation     . I am not sure if mother is taking Seroquel       Daughter [name] stated Current Barriers:  [name] Kitchen Knowledge Deficits related to Medication Management and familiarly of patient's prescribed medication regimen . Dementia  Nurse Case Manager Clinical Goal(s):  .  Over the next 30 days, patient's daughter Patricia Simmons will work with the CCM team to address needs related to increasing knowledge and understanding of patients Medication regimen including the indication, dosage and frequency of each medication    CCM RN CM Interventions:  08/03/19 call completed with patient's daughter Lynnae Ludemann  . Evaluation of current medication regimen and patient's adherence to  plan as established by provider  . 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 Discussed Patricia Simmons is administering patients medications during the weekdays when Ms. Lindon is in her care; sister Okey Dupre assists with med admin on Saturday & Sunday when [name] is in sister Patricia Simmons's care . Discussed plans with patient for ongoing care management follow up and provided patient with direct contact information for care management team  Patient Self Care Activities:  . Currently UNABLE TO independently perform self care  Initial goal documentation     . I haven't noticed any Hallucinations       Daughter Patricia Simmons stated Current Barriers:  [name] Kitchen Knowledge Deficits related to Dementia disease process and knowing what to expect with disease progression  . Cognitive Deficits  Nurse Case Manager Clinical Goal(s):  [name] Kitchen Over the next 30 days, patient will work with PCP provider to address needs related to Neurology referral for evaluation and treatment of Dementia  CCM RN CM Interventions:  08/03/19 call completed with patients daughter Patricia Simmons   . Evaluation of current treatment plan related to Dementia and patient's adherence to plan as established by provider. . Provided education to patient re: Dementia diagnosis, disease process and treatment management; discussed daughters knowledge deficit related to what type of Dementia and best treatment options; discussed patient may benefit from Neurology evaluation  . Collaborated with provider Arnette Felts, FNP  regarding request for Neurology referral to further evaluate and treat patient's Dementia . Discussed plans with patient for ongoing care management follow up and provided patient with direct  contact information for care management team . Provided patient with printed educational materials related to Dementia   Patient Self Care Activities:  . Currently UNABLE TO independently perform Self Care  Initial goal documentation        Plan:   Telephone follow up appointment with care management team member scheduled for: 09/04/19  Delsa Sale, RN, BSN, CCM Care Management Coordinator [facility] Care Management/[facility]  Direct Phone: [phone]

## 2019-08-08 ENCOUNTER — Ambulatory Visit: Payer: Self-pay

## 2019-08-08 DIAGNOSIS — I10 Essential (primary) hypertension: Secondary | ICD-10-CM

## 2019-08-08 DIAGNOSIS — F0390 Unspecified dementia without behavioral disturbance: Secondary | ICD-10-CM

## 2019-08-08 DIAGNOSIS — E876 Hypokalemia: Secondary | ICD-10-CM

## 2019-08-08 NOTE — Chronic Care Management (AMB) (Signed)
Chronic Care Management   Follow Up Note   08/08/2019 Name: ACACIA LATORRE MRN: [id number] DOB: February 17, 1936  Referred by: [name], FNP Reason for referral : Chronic Care Management (CCM RNCM Telephone Follow up)   EUGENIA ELDREDGE is a 83 y.o. year old female who is a primary care patient of [name], [place]. The CCM team was consulted for assistance with chronic disease management and care coordination needs.    Review of patient status, including review of consultants reports, relevant laboratory and other test results, and collaboration with appropriate care team members and the patient's provider was performed as part of comprehensive patient evaluation and provision of chronic care management services.    I received an inbound call from patient's daughter Jeriah Skufca today.   Outpatient Encounter Medications as of 08/08/2019  Medication Sig  . acetaminophen (TYLENOL) 500 MG tablet Take 1,000 mg by mouth every 6 (six) hours as needed for mild pain or headache.  . citalopram (CELEXA) 10 MG tablet Take 1 tablet (10 mg total) by mouth daily.  . diclofenac sodium (VOLTAREN) 1 % GEL Apply 2 g topically 4 (four) times daily.  . furosemide (LASIX) 20 MG tablet Take 1 tablet by mouth once daily  . hydrochlorothiazide (HYDRODIURIL) 12.5 MG tablet Take 2 tablets by mouth once daily  . losartan (COZAAR) 50 MG tablet Take 1 tablet (50 mg total) by mouth daily.  [name] Kitchen omeprazole (PRILOSEC) 40 MG capsule Take 1 capsule (40 mg total) by mouth daily.  . potassium chloride (K-DUR) 10 MEQ tablet Take 1 tablet by mouth once daily  . QUEtiapine (SEROQUEL) 25 MG tablet Take 1 tablet (25 mg total) by mouth daily.  [name] Kitchen triamcinolone cream (KENALOG) 0.1 % Apply 1 application topically 2 (two) times daily.   No facility-administered encounter medications on file as of 08/08/2019.      Goals Addressed    . "I am trying to get legal gaurdianship over my mother"       Daughter Niki Payment stated Current  Barriers:  . Lacks caregiver support.  . Cognitive Deficits . No Advanced Directives in place  Nurse Case Manager Clinical Goal(s):  [name] Kitchen Over the next 30 days, patient and daughter Yanelle Sousa will attend a legal hearing to address obtaining legal guardianship for Ms. Naylani M. Mcinroy (hearing is scheduled for 08/22/19)  CCM RN CM Interventions:  08/08/19 spoke with patient's daughter Larsen Zettel   . Inbound call received from daughter Elaria Osias requesting a copy of her mother's demographic profile confirming she is listed as the primary contact and that her address is listed as her mother's primary permanent address  . Advised daughter [name] this information is electronic; advised her to contact the  [place] office to request a copy; provided the [facility] office number . Discussed Ms. Ganesh is now living with daughter [name] at her home Monday-Friday and that [name] has taken over as the patient's primary caregiver ; discussed Francesca Jewett is displaying some symptoms of memory loss and their was evidence her mother was not receiving the proper care; discussed Francesca Jewett is caring for Ms. Lipson on Saturday and Sunday only at this point in time . Discussed daughter Adie Vilar has initiated the process to obtain legal guardianship for her mother and will attend a hearing on 08/22/19  . Discussed plans with patient for ongoing care management follow up and provided patient with direct contact information for care management team  Patient Self Care Activities:  . Attends all scheduled provider  appointments . Unable to self administers medications as prescribed  Initial goal documentation         Telephone follow up appointment with care management team member scheduled for: 09/04/19   Delsa Sale, RN, BSN, CCM Care Management Coordinator [facility] Care Management/[facility]  Direct Phone: [phone]

## 2019-08-08 NOTE — Patient Instructions (Signed)
Visit Information  Goals Addressed    . "I am trying to get legal gaurdianship over my mother"       Daughter Kymberlee Viger stated Current Barriers:  . Lacks caregiver support.  . Cognitive Deficits . No Advanced Directives in place  Nurse Case Manager Clinical Goal(s):  [name] Kitchen Over the next 30 days, patient and daughter Jilliane Kazanjian will attend a legal hearing to address obtaining legal guardianship for [name] (hearing is scheduled for 08/22/19)  CCM RN CM Interventions:  08/08/19 spoke with patient's daughter Laelle Bridgett   . Inbound call received from daughter Richie Vadala requesting a copy of her mother's demographic profile confirming she is listed as the primary contact and that her address is listed as her mother's primary permanent address  . Advised daughter [name] this information is electronic; advised her to contact the  [place] office to request a copy; provided the [facility] office number . Discussed Ms. Pamintuan is now living with daughter [name] at her home Monday-Friday and that [name] has taken over as the patient's primary caregiver ; discussed Francesca Jewett is displaying some symptoms of memory loss and their was evidence her mother was not receiving the proper care; discussed Francesca Jewett is caring for Patricia Simmons on Saturday and Sunday only at this point in time . Discussed daughter Caedence Snowden has initiated the process to obtain legal guardianship for her mother and will attend a hearing on 08/22/19  . Discussed plans with patient for ongoing care management follow up and provided patient with direct contact information for care management team  Patient Self Care Activities:  . Attends all scheduled provider appointments . Unable to self administers medications as prescribed  Initial goal documentation        The patient verbalized understanding of instructions provided today and declined a print copy of patient instruction materials.   Telephone follow up appointment with care  management team member scheduled for: 09/04/19  [name], RN, BSN, CCM Care Management Coordinator [place] [facility]  Direct Phone: [phone]

## 2019-08-15 ENCOUNTER — Ambulatory Visit: Payer: Medicare Other | Admitting: Nurse Practitioner

## 2019-08-21 ENCOUNTER — Telehealth: Payer: Self-pay

## 2019-08-21 ENCOUNTER — Encounter: Payer: Self-pay | Admitting: Nurse Practitioner

## 2019-08-21 NOTE — Telephone Encounter (Signed)
Patient's daughter [name] called stating she has guardianship court tomorrow and she needs a letter stating that the patient has Dementia and Parkinson's Disease to be sent to Mrs.Jerelene Redden the [place] at [phone]  I RETURNED HER CALL AND ADVISED HER THAT WE HAVE FAXED OVER THE LETTER. YRL,RMA

## 2019-08-30 ENCOUNTER — Other Ambulatory Visit: Payer: Self-pay

## 2019-08-30 DIAGNOSIS — Z20822 Contact with and (suspected) exposure to covid-19: Secondary | ICD-10-CM

## 2019-09-01 LAB — NOVEL CORONAVIRUS, NAA: SARS-CoV-2, NAA: NOT DETECTED

## 2019-09-03 ENCOUNTER — Telehealth: Payer: Self-pay | Admitting: General Practice

## 2019-09-03 NOTE — Telephone Encounter (Signed)
Negative COVID results given. Patient results "NOT Detected." Caller expressed understanding. ° °

## 2019-09-04 ENCOUNTER — Telehealth: Payer: Self-pay

## 2019-09-12 ENCOUNTER — Ambulatory Visit: Payer: Medicare Other | Admitting: Nurse Practitioner

## 2019-09-24 ENCOUNTER — Other Ambulatory Visit: Payer: Self-pay | Admitting: Nurse Practitioner

## 2019-09-24 DIAGNOSIS — R443 Hallucinations, unspecified: Secondary | ICD-10-CM

## 2019-09-24 DIAGNOSIS — I10 Essential (primary) hypertension: Secondary | ICD-10-CM

## 2019-09-24 DIAGNOSIS — R350 Frequency of micturition: Secondary | ICD-10-CM

## 2019-09-26 ENCOUNTER — Ambulatory Visit (INDEPENDENT_AMBULATORY_CARE_PROVIDER_SITE_OTHER): Payer: Medicare Other | Admitting: Nurse Practitioner

## 2019-09-26 ENCOUNTER — Encounter: Payer: Self-pay | Admitting: Nurse Practitioner

## 2019-09-26 ENCOUNTER — Other Ambulatory Visit: Payer: Self-pay

## 2019-09-26 VITALS — BP 144/80 | HR 73 | Temp 98.5°F | Ht 60.8 in | Wt 160.0 lb

## 2019-09-26 DIAGNOSIS — K219 Gastro-esophageal reflux disease without esophagitis: Secondary | ICD-10-CM | POA: Diagnosis not present

## 2019-09-26 DIAGNOSIS — G3184 Mild cognitive impairment, so stated: Secondary | ICD-10-CM | POA: Diagnosis not present

## 2019-09-26 DIAGNOSIS — Z23 Encounter for immunization: Secondary | ICD-10-CM

## 2019-09-26 DIAGNOSIS — R350 Frequency of micturition: Secondary | ICD-10-CM | POA: Diagnosis not present

## 2019-09-26 DIAGNOSIS — I10 Essential (primary) hypertension: Secondary | ICD-10-CM

## 2019-09-26 DIAGNOSIS — R443 Hallucinations, unspecified: Secondary | ICD-10-CM

## 2019-09-26 DIAGNOSIS — K068 Other specified disorders of gingiva and edentulous alveolar ridge: Secondary | ICD-10-CM

## 2019-09-26 MED ORDER — PNEUMOCOCCAL 13-VAL CONJ VACC IM SUSP: 0.5000 mL | INTRAMUSCULAR | 0 refills | Status: AC

## 2019-09-26 MED ORDER — OMEPRAZOLE 40 MG PO CPDR: 40.0000 mg | DELAYED_RELEASE_CAPSULE | Freq: Every day | ORAL | 1 refills | Status: DC

## 2019-09-26 MED ORDER — QUETIAPINE FUMARATE 25 MG PO TABS: 25.0000 mg | ORAL_TABLET | Freq: Every day | ORAL | 1 refills | Status: DC

## 2019-09-26 MED ORDER — QUETIAPINE FUMARATE 50 MG PO TABS: 50.0000 mg | ORAL_TABLET | Freq: Every day | ORAL | 1 refills | Status: DC

## 2019-09-26 NOTE — Progress Notes (Signed)
Subjective:     Patient ID: Patricia Simmons , female    DOB: 1936-04-15 , 83 y.o.   MRN: [id number]   Chief Complaint  Patient presents with  . Hallucinations    patient has been doing good on the medication    HPI  She is here today with her daughter Nialah Saravia.  She is here for a follow up on her medications (Seroquel).  The patient is staying with her daughter Sidonie Dexheimer.  She was tested for coronavirus on 08/30/2019 for possible exposure and was negative.  Her daughter feels she is not having any incontinence episodes. At times while watching TV she will wave at the people and say they are watching her. She feels the medication is helping but is still having frequent hallucinations. She feels she does well with her bathing and dressing once her daughter lays out her clothes. She will get her bra twisted at times. Overall she is doing well with prompting for her ADL's.  Her daughter did provide me with court papers indicating she is her guardian.  Wt Readings from Last 3 Encounters: 09/26/19 : 160 lb (72.6 kg) 07/18/19 : 153 lb 12.8 oz (69.8 kg) 06/11/19 : 148 lb 6.4 oz (67.3 kg)       Past Medical History:  Diagnosis Date  . Arthritis   . Blood transfusion without reported diagnosis   . Hypertension      Family History  Problem Relation Age of Onset  . Stroke Mother   . Hypertension Mother   . Prostate cancer Father   . Hypertension Sister   . Hyperlipidemia Sister   . Emphysema Brother   . Lung cancer Brother      Current Outpatient Medications:  .  acetaminophen (TYLENOL) 500 MG tablet, Take 1,000 mg by mouth every 6 (six) hours as needed for mild pain or headache., Disp: , Rfl:  .  citalopram (CELEXA) 10 MG tablet, Take 1 tablet (10 mg total) by mouth daily., Disp: 90 tablet, Rfl: 1 .  diclofenac sodium (VOLTAREN) 1 % GEL, Apply 2 g topically 4 (four) times daily., Disp: 1 Tube, Rfl: 2 .  hydrochlorothiazide (HYDRODIURIL) 12.5 MG tablet, Take 2 tablets by mouth  once daily, Disp: 180 tablet, Rfl: 0 .  losartan (COZAAR) 50 MG tablet, Take 1 tablet (50 mg total) by mouth daily., Disp: 90 tablet, Rfl: 1 .  omeprazole (PRILOSEC) 40 MG capsule, Take 1 capsule (40 mg total) by mouth daily., Disp: 90 capsule, Rfl: 1 .  QUEtiapine (SEROQUEL) 25 MG tablet, Take 1 tablet by mouth once daily, Disp: 30 tablet, Rfl: 0 .  triamcinolone cream (KENALOG) 0.1 %, Apply 1 application topically 2 (two) times daily., Disp: 30 g, Rfl: 0 .  furosemide (LASIX) 20 MG tablet, Take 1 tablet by mouth once daily (Patient not taking: Reported on 09/26/2019), Disp: 30 tablet, Rfl: 0 .  potassium chloride (K-DUR) 10 MEQ tablet, Take 1 tablet by mouth once daily (Patient not taking: Reported on 09/26/2019), Disp: 30 tablet, Rfl: 0   Allergies  Allergen Reactions  . Codeine     unknown     Review of Systems  Constitutional: Negative.   HENT:       Upper gum pain  Respiratory: Negative.   Cardiovascular: Negative.  Negative for chest pain, palpitations and leg swelling.  Endocrine: Negative for polydipsia and polyphagia.  Neurological: Negative.  Negative for dizziness and headaches.  Psychiatric/Behavioral: Negative.      Today's Vitals  09/26/19 1038  BP: (!) 144/80  Pulse: 73  Temp: 98.5 F (36.9 C)  TempSrc: Oral  Weight: 160 lb (72.6 kg)  Height: 5' 0.8" (1.544 m)  PainSc: 0-No pain   Body mass index is 30.43 kg/m.   Objective:  Physical Exam Constitutional:      General: She is not in acute distress.    Appearance: Normal appearance. She is obese.  Cardiovascular:     Rate and Rhythm: Normal rate and regular rhythm.     Pulses: Normal pulses.     Heart sounds: No murmur.  Pulmonary:     Effort: Pulmonary effort is normal. No respiratory distress.     Breath sounds: Normal breath sounds.  Skin:    General: Skin is warm and dry.     Capillary Refill: Capillary refill takes less than 2 seconds.  Neurological:     General: No focal deficit present.      Mental Status: She is alert and oriented to person, place, and time.     Cranial Nerves: No cranial nerve deficit.  Psychiatric:        Mood and Affect: Mood normal.        Behavior: Behavior normal.        Thought Content: Thought content normal.        Judgment: Judgment normal.         Assessment And Plan:     1. Gastroesophageal reflux disease without esophagitis  Chronic, stable  Refill sent to pharmacy - omeprazole (PRILOSEC) 40 MG capsule; Take 1 capsule (40 mg total) by mouth daily.  Dispense: 90 capsule; Refill: 1  2. Hallucination  Slight improvement with 25 mg Seroquel however continues to have hallucinations mostly when watching TV  I will increase her seroquel to 50 mg daily - QUEtiapine (SEROQUEL) 50 MG tablet; Take 1 tablet (50 mg total) by mouth daily.  Dispense: 90 tablet; Refill: 1  3. Increased frequency of urination  She will try to get urine sample here in the office to check for infection - QUEtiapine (SEROQUEL) 50 MG tablet; Take 1 tablet (50 mg total) by mouth daily.  Dispense: 90 tablet; Refill: 1  4. Essential hypertension . B/P is controlled.  [name] Kitchen BMP ordered to check renal function.  . The importance of regular exercise and dietary modification was stressed to the patient.  - BMP8+eGFR  5. Mild cognitive impairment  Her daughter is requesting a referral to neurology to have an evaluation for her memory impairment - Ambulatory referral to Neurology  6. Encounter for immunization  - pneumococcal 13-valent conjugate vaccine (PREVNAR 13) SUSP injection; Inject 0.5 mLs into the muscle tomorrow at 10 am for 1 dose.  Dispense: 0.5 mL; Refill: 0  7. Pain in gums  Daughter reports the patient has been complaining of gum pain to her upper gums, she does not have her dentures at this time.  Will send Rx for a swish and spit mouthwash  There is mild irritation to upper left gums  Encouraged to eat soft foods - magic mouthwash w/lidocaine SOLN;  Take 5 mLs by mouth 3 (three) times daily as needed for mouth pain.  Dispense: 120 mL; Refill: 0   [name], FNP    THE PATIENT IS ENCOURAGED TO PRACTICE SOCIAL DISTANCING DUE TO THE COVID-19 PANDEMIC.

## 2019-09-26 NOTE — Patient Instructions (Signed)
Dementia Dementia is a condition that affects the way the brain functions. It often affects memory and thinking. Usually, dementia gets worse with time and cannot be reversed (progressive dementia). There are many types of dementia, including:  Alzheimer's disease. This type is the most common.  Vascular dementia. This type may happen as the result of a stroke.  Lewy body dementia. This type may happen to people who have Parkinson's disease.  Frontotemporal dementia. This type is caused by damage to nerve cells (neurons) in certain parts of the brain. Some people may be affected by more than one type of dementia. This is called mixed dementia. What are the causes? Dementia is caused by damage to cells in the brain. The area of the brain and the types of cells damaged determine the type of dementia. Usually, this damage is irreversible or cannot be undone. Some examples of irreversible causes include:  Conditions that affect the blood vessels of the brain, such as diabetes, heart disease, or blood vessel disease.  Genetic mutations. In some cases, changes in the brain may be caused by another condition and can be reversed or slowed. Some examples of reversible causes include:  Injury to the brain.  Certain medicines.  Infection, such as meningitis.  Metabolic problems, such as vitamin B12 deficiency or thyroid disease.  Pressure on the brain, such as from a tumor or blood clot. What are the signs or symptoms? Symptoms of dementia depend on the type of dementia. Common signs of dementia include problems with remembering, thinking, problem solving, decision making, and communicating. These signs develop slowly or get worse with time. This may include:  Problems remembering things.  Having trouble taking a bath or putting clothes on.  Forgetting appointments.  Forgetting to pay bills.  Difficulty planning and preparing meals.  Having trouble speaking.  Getting lost easily. How  is this diagnosed? This condition is diagnosed by a specialist (neurologist). It is diagnosed based on the history of your symptoms, your medical history, a physical exam, and tests. Tests may include:  Tests to evaluate brain function, such as memory tests, cognitive tests, and other tests.  Lab tests, such as blood or urine tests.  Imaging tests, such as a CT scan, a PET scan, or an MRI.  Genetic testing. This may be done if other family members have a diagnosis of certain types of dementia. Your health care provider will talk with you and your family, friends, or caregivers about your history and symptoms. How is this treated?  Treatment for this condition depends on the cause of the dementia. Progressive dementias, such as Alzheimer's disease, cannot be cured, but there may be treatments that help to manage symptoms. Treatment might involve taking medicines that may help to:  Control the dementia.  Slow down the progression of the dementia.  Manage symptoms. In some cases, treating the cause of your dementia can improve symptoms, reverse symptoms, or slow down how quickly your dementia becomes worse. Your health care provider can direct you to support groups, organizations, and other health care providers who can help with decisions about your care. Follow these instructions at home: Medicines  Take over-the-counter and prescription medicines only as told by your health care provider.  Use a pill organizer or pill reminder to help you manage your medicines.  Avoid taking medicines that can affect thinking, such as pain medicines or sleeping medicines. Lifestyle  Make healthy lifestyle choices. ? Be physically active as told by your health care provider. ? Do   not use any products that contain nicotine or tobacco, such as cigarettes, e-cigarettes, and chewing tobacco. If you need help quitting, ask your health care provider. ? Do not drink alcohol. ? Practice stress-management  techniques when you get stressed. ? Spend time with other people.  Make sure to get quality sleep. These tips can help you get a good night's rest: ? Avoid napping during the day. ? Keep your sleeping area dark and cool. ? Avoid exercising during the few hours before you go to bed. ? Avoid caffeine products in the evening. Eating and drinking  Drink enough fluid to keep your urine pale yellow.  Eat a healthy diet. General instructions   Work with your health care provider to determine what you need help with and what your safety needs are.  Talk with your health care provider about whether it is safe for you to drive.  If you were given a bracelet that identifies you as a person with memory loss or tracks your location, make sure to wear it at all times.  Work with your family to make important decisions, such as advance directives, medical power of attorney, or a living will.  Keep all follow-up visits as told by your health care provider. This is important. Where to find more information  Alzheimer's Association: CapitalMile.co.nz  National Institute on Aging: DVDEnthusiasts.nl  World Health Organization: RoleLink.com.br Contact a health care provider if:  You have any new or worsening symptoms.  You have problems with choking or swallowing. Get help right away if:  You feel depressed or sad, or feel that you want to harm yourself.  Your family members become concerned for your safety. If you ever feel like you may hurt yourself or others, or have thoughts about taking your own life, get help right away. You can go to your nearest emergency department or call:  Your local emergency services (911 in the [place]  A suicide crisis helpline, such as the [facility] at 816 624 6084. This is open 24 hours a day. Summary  Dementia is a condition that affects the way the brain functions. Dementia often affects memory and thinking.  Usually,  dementia gets worse with time and cannot be reversed (progressive dementia).  Treatment for this condition depends on the cause of the dementia.  Work with your health care provider to determine what you need help with and what your safety needs are.  Your health care provider can direct you to support groups, organizations, and other health care providers who can help with decisions about your care. This information is not intended to replace advice given to you by your health care provider. Make sure you discuss any questions you have with your health care provider. Document Released: 04/20/2001 Document Revised: 01/09/2019 Document Reviewed: 01/09/2019 Elsevier Patient Education  [place]. Dementia Caregiver Guide Dementia is a term used to describe a number of symptoms that affect memory and thinking. The most common symptoms include:  Memory loss.  Trouble with language and communication.  Trouble concentrating.  Poor judgment.  Problems with reasoning.  Child-like behavior and language.  Extreme anxiety.  Angry outbursts.  Wandering from home or public places. Dementia usually gets worse slowly over time. In the early stages, people with dementia can stay independent and safe with some help. In later stages, they need help with daily tasks such as dressing, grooming, and using the bathroom. How to help the person with dementia cope Dementia can be frightening and confusing. Here  are some tips to help the person with dementia cope with changes caused by the disease. General tips  Keep the person on track with his or her routine.  Try to identify areas where the person may need help.  Be supportive, patient, calm, and encouraging.  Gently remind the person that adjusting to changes takes time.  Help with the tasks that the person has asked for help with.  Keep the person involved in daily tasks and decisions as much as possible.  Encourage conversation,  but try not to get frustrated or harried if the person struggles to find words or does not seem to appreciate your help. Communication tips  When the person is talking or seems frustrated, make eye contact and hold the person's hand.  Ask specific questions that need yes or no answers.  Use simple words, short sentences, and a calm voice. Only give one direction at a time.  When offering choices, limit them to just 1 or 2.  Avoid correcting the person in a negative way.  If the person is struggling to find the right words, gently try to help him or her. How to recognize symptoms of stress Symptoms of stress in caregivers include:  Feeling frustrated or angry with the person with dementia.  Denying that the person has dementia or that his or her symptoms will not improve.  Feeling hopeless and unappreciated.  Difficulty sleeping.  Difficulty concentrating.  Feeling anxious, irritable, or depressed.  Developing stress-related health problems.  Feeling like you have too little time for your own life. Follow these instructions at home:   Make sure that you and the person you are caring for: ? Get regular sleep. ? Exercise regularly. ? Eat regular, nutritious meals. ? Drink enough fluid to keep your urine clear or pale yellow. ? Take over-the-counter and prescription medicines only as told by your health care providers. ? Attend all scheduled health care appointments.  Join a support group with others who are caregivers.  Ask about respite care resources so that you can have a regular break from the stress of caregiving.  Look for signs of stress in yourself and in the person you are caring for. If you notice signs of stress, take steps to manage it.  Consider any safety risks and take steps to avoid them.  Organize medications in a pill box for each day of the week.  Create a plan to handle any legal or financial matters. Get legal or financial advice if needed.   Keep a calendar in a central location to remind the person of appointments or other activities. Tips for reducing the risk of injury  Keep floors clear of clutter. Remove rugs, magazine racks, and floor lamps.  Keep hallways well lit, especially at night.  Put a handrail and nonslip mat in the bathtub or shower.  Put childproof locks on cabinets that contain dangerous items, such as medicines, alcohol, guns, toxic cleaning items, sharp tools or utensils, matches, and lighters.  Put the locks in places where the person cannot see or reach them easily. This will help ensure that the person does not wander out of the house and get lost.  Be prepared for emergencies. Keep a list of emergency phone numbers and addresses in a convenient area.  Remove car keys and lock garage doors so that the person does not try to get in the car and drive.  Have the person wear a bracelet that tracks locations and identifies the person as having  memory problems. This should be worn at all times for safety. Where to find support: Many individuals and organizations offer support. These include:  Support groups for people with dementia and for caregivers.  Counselors or therapists.  Home health care services.  Adult day care centers. Where to find more information Alzheimer's Association: LimitLaws.huwww.alz.org Contact a health care provider if:  The person's health is rapidly getting worse.  You are no longer able to care for the person.  Caring for the person is affecting your physical and emotional health.  The person threatens himself or herself, you, or anyone else. Summary  Dementia is a term used to describe a number of symptoms that affect memory and thinking.  Dementia usually gets worse slowly over time.  Take steps to reduce the person's risk of injury, and to plan for future care.  Caregivers need support, relief from caregiving, and time for their own lives. This information is not intended  to replace advice given to you by your health care provider. Make sure you discuss any questions you have with your health care provider. Document Released: 09/28/2016 Document Revised: 10/07/2017 Document Reviewed: 09/28/2016 Elsevier Patient Education  2020 [employer]

## 2019-09-27 LAB — BMP8+EGFR
BUN/Creatinine Ratio: 10 — ABNORMAL LOW (ref 12–28)
BUN: 7 mg/dL — ABNORMAL LOW (ref 8–27)
CO2: 24 mmol/L (ref 20–29)
Calcium: 9.4 mg/dL (ref 8.7–10.3)
Chloride: 102 mmol/L (ref 96–106)
Creatinine, Ser: 0.69 mg/dL (ref 0.57–1.00)
GFR calc Af Amer: 94 mL/min/{1.73_m2} (ref >59)
GFR calc non Af Amer: 82 mL/min/{1.73_m2} (ref >59)
Glucose: 102 mg/dL — ABNORMAL HIGH (ref 65–99)
Potassium: 3.3 mmol/L — ABNORMAL LOW (ref 3.5–5.2)
Sodium: 140 mmol/L (ref 134–144)

## 2019-09-27 MED ORDER — MAGIC MOUTHWASH W/LIDOCAINE: 5.0000 mL | Freq: Three times a day (TID) | ORAL | 0 refills | Status: DC | PRN

## 2019-10-07 ENCOUNTER — Other Ambulatory Visit: Payer: Self-pay | Admitting: Nurse Practitioner

## 2019-10-07 DIAGNOSIS — F329 Major depressive disorder, single episode, unspecified: Secondary | ICD-10-CM

## 2019-10-07 DIAGNOSIS — F32A Depression, unspecified: Secondary | ICD-10-CM

## 2019-10-19 ENCOUNTER — Ambulatory Visit: Payer: Self-pay

## 2019-10-19 ENCOUNTER — Telehealth: Payer: Self-pay

## 2019-10-19 DIAGNOSIS — F0390 Unspecified dementia without behavioral disturbance: Secondary | ICD-10-CM

## 2019-10-19 DIAGNOSIS — E876 Hypokalemia: Secondary | ICD-10-CM

## 2019-10-19 DIAGNOSIS — I10 Essential (primary) hypertension: Secondary | ICD-10-CM

## 2019-10-22 ENCOUNTER — Ambulatory Visit: Payer: Self-pay

## 2019-10-22 DIAGNOSIS — R443 Hallucinations, unspecified: Secondary | ICD-10-CM

## 2019-10-22 DIAGNOSIS — E876 Hypokalemia: Secondary | ICD-10-CM

## 2019-10-22 DIAGNOSIS — I10 Essential (primary) hypertension: Secondary | ICD-10-CM

## 2019-10-22 DIAGNOSIS — F0390 Unspecified dementia without behavioral disturbance: Secondary | ICD-10-CM

## 2019-10-22 NOTE — Patient Instructions (Signed)
Social Worker Visit Information  Goals we discussed today:  Goals Addressed            This Visit's Progress   . COMPLETED: "I am trying to get legal gaurdianship over my mother"       Daughter Patricia Simmons stated Current Barriers:  . Lacks caregiver support.  . Cognitive Deficits . No Advanced Directives in place  Nurse Case Manager Clinical Goal(s):  [name] Kitchen Over the next 30 days, patient and daughter Patricia Simmons will attend a legal hearing to address obtaining legal guardianship for [name] (hearing is scheduled for 08/22/19)  CCM SW Interventions: Completed 10/22/2019 . Outbound call placed to the patients daughter and caregiver Patricia Simmons . Determined [name] has been granted guardianship of the patient and the patient currently lives with [name] full time . Goal Met  Patient Self Care Activities:  . Attends all scheduled provider appointments . Unable to self administers medications as prescribed  Initial goal documentation     . I haven't noticed any Hallucinations       Daughter Patricia Simmons stated Current Barriers:  [name] Kitchen Knowledge Deficits related to Dementia disease process and knowing what to expect with disease progression  . Cognitive Deficits  Nurse Case Manager Clinical Goal(s):  [name] Kitchen Over the next 30 days, patient will work with PCP provider to address needs related to Neurology referral for evaluation and treatment of Dementia . 10/22/2019: Over the next 30 days the patient will attend her scheduled neurology appointment to address ongoing changes to cognition hallucinations.  CCM SW Interventions Completed 10/22/2019 with Patricia Simmons . Outbound call placed to Patricia Simmons to review progression of patient goal . Discussed plans to follow up with Dr Jaynee Eagles on January 14 for initial evaluation . Collaboration with RN Case Manager to update on outcome of neurology referral   Patient Self Care Activities:  . Currently UNABLE TO independently perform Self  Care  Please see past updates related to this goal by clicking on the "Past Updates" button in the selected goal      . We are thinking about enrolling [facility] so she can have some interaction       Daughter stated:  Current Barriers:  . ADL/iADL limitation due to memory loss . Ongoing care management needs due to chronic conditions including HTN and Hypokalemia . Knowledge deficits related to the [facility] enrollment process   Social Work Clinical Goal(s):  [name] Kitchen Over the next 30 days the patient and her daughter will work with SW to become more knowledgeable of [facility] program . Over the next 45 days the patient and her daughter will have a better understanding of caregiver resources to assist with ADL needs  CCM SW Interventions: Completed 10/22/2019 with Patricia Simmons . Outbound call placed to Patricia Simmons to assist with care coordination needs . Discussed desire to have a caregiver for the patient to assist with ADL limitations . Assessed for current plan coverage: Patricia Simmons reports the patient has both Medicare and Medicaid . Determined the patient had previous plans to enroll with [facility] but changed her mind o Patricia Simmons on services offered by [facility] o Discussed concern over switching primary provider to enroll with [facility] program . Reviewed options to apply for Medicaid [facility] coverage o Informed by Patricia Simmons the patient has both Medicare and Medicaid o Unable to locate Medicaid ID under Media Manager o Discussed plan for [employer]  to obtain Medicaid card from her sister Patricia Simmons whom was previously caring for the patient . Scheduled follow up call to Patricia Simmons over the next month to review payor benefits and provide further education on resources available to the patient  Patient Self Care Activities:  . Unable to manage self care needs due to cognition. Relies on daughter and caregiver to assist   Initial goal documentation          Materials Provided: Verbal education about caregiver resources provided by phone  Follow Up Plan: SW will follow up with patient by phone over the next month   Daneen Schick, BSW, CDP Social Worker, Certified Dementia Practitioner [facility] [facility] [phone]

## 2019-10-22 NOTE — Chronic Care Management (AMB) (Signed)
Chronic Care Management    Social Work Follow Up Note  10/22/2019 Name: Patricia Simmons MRN: [id number] DOB: 26-Feb-1936  Patricia Simmons is a 83 y.o. year old female who is a primary care patient of Patricia Simmons, [place]. The CCM team was consulted for assistance with care coordination.   Review of patient status, including review of consultants reports, other relevant assessments, and collaboration with appropriate care team members and the patient's provider was performed as part of comprehensive patient evaluation and provision of chronic care management services.    SW placed an outbound call to the patients daughter and primary caregiver Patricia Simmons to review progression of patient goals and assist with care coordination needs.  Outpatient Encounter Medications as of 10/22/2019  Medication Sig  . acetaminophen (TYLENOL) 500 MG tablet Take 1,000 mg by mouth every 6 (six) hours as needed for mild pain or headache.  . citalopram (CELEXA) 10 MG tablet Take 1 tablet by mouth once daily  . diclofenac sodium (VOLTAREN) 1 % GEL Apply 2 g topically 4 (four) times daily.  . hydrochlorothiazide (HYDRODIURIL) 12.5 MG tablet Take 2 tablets by mouth once daily  . losartan (COZAAR) 50 MG tablet Take 1 tablet (50 mg total) by mouth daily.  . magic mouthwash w/lidocaine SOLN Take 5 mLs by mouth 3 (three) times daily as needed for mouth pain.  [name] Kitchen omeprazole (PRILOSEC) 40 MG capsule Take 1 capsule (40 mg total) by mouth daily.  . QUEtiapine (SEROQUEL) 50 MG tablet Take 1 tablet (50 mg total) by mouth daily.  [name] Kitchen triamcinolone cream (KENALOG) 0.1 % Apply 1 application topically 2 (two) times daily.   No facility-administered encounter medications on file as of 10/22/2019.     Goals Addressed            This Visit's Progress   . COMPLETED: "I am trying to get legal gaurdianship over my mother"       Daughter Patricia Simmons stated Current Barriers:  . Lacks caregiver support.  . Cognitive Deficits . No  Advanced Directives in place  Nurse Case Manager Clinical Goal(s):  [name] Kitchen Over the next 30 days, patient and daughter Patricia Simmons will attend a legal hearing to address obtaining legal guardianship for [name] (hearing is scheduled for 08/22/19)  CCM SW Interventions: Completed 10/22/2019 . Outbound call placed to the patients daughter and caregiver Patricia Simmons . Determined Patricia Simmons has been granted guardianship of the patient and the patient currently lives with Patricia. Simmons full time . Goal Met  Patient Self Care Activities:  . Attends all scheduled provider appointments . Unable to self administers medications as prescribed  Initial goal documentation     . I haven't noticed any Hallucinations       Daughter Patricia Simmons stated Current Barriers:  [name] Kitchen Knowledge Deficits related to Dementia disease process and knowing what to expect with disease progression  . Cognitive Deficits  Nurse Case Manager Clinical Goal(s):  [name] Kitchen Over the next 30 days, patient will work with PCP provider to address needs related to Neurology referral for evaluation and treatment of Dementia . 10/22/2019: Over the next 30 days the patient will attend her scheduled neurology appointment to address ongoing changes to cognition hallucinations.  CCM SW Interventions Completed 10/22/2019 with Patricia Simmons . Outbound call placed to Patricia Simmons to review progression of patient goal . Discussed plans to follow up with Dr Jaynee Eagles on January 14 for initial evaluation . Collaboration with RN Case Manager to update on  outcome of neurology referral  Patient Self Care Activities:  . Currently UNABLE TO independently perform Self Care  Please see past updates related to this goal by clicking on the "Past Updates" button in the selected goal      . We are thinking about enrolling in [facility] so she can have some interaction       Daughter stated:  Current Barriers:  . ADL/iADL limitation due to memory loss . Ongoing care  management needs due to chronic conditions including HTN and Hypokalemia . Knowledge deficits related to the [facility] enrollment process   Social Work Clinical Goal(s):  [name] Kitchen Over the next 30 days the patient and her daughter will work with SW to become more knowledgeable of [facility] program . Over the next 45 days the patient and her daughter will have a better understanding of caregiver resources to assist with ADL needs  CCM SW Interventions: Completed 10/22/2019 with Patricia Simmons . Outbound call placed to Patricia Simmons to assist with care coordination needs . Discussed desire to have a caregiver for the patient to assist with ADL limitations . Assessed for current plan coverage: Patricia Simmons reports the patient has both Medicare and Medicaid . Determined the patient had previous plans to enroll with [facility] but changed her mind o Patricia Simmons on services offered by [facility] o Discussed concern over switching primary provider to enroll with [facility] program . Reviewed options to apply for Medicaid [facility] coverage o Informed by Patricia Simmons the patient has both Medicare and Medicaid o Unable to locate Medicaid ID under Media [employer] o Discussed plan for Patricia Simmons to obtain Medicaid card from her sister Patricia Simmons whom was previously caring for the patient . Scheduled follow up call to Patricia Simmons over the next month to review payor benefits and provide further education on resources available to the patient  Patient Self Care Activities:  . Unable to manage self care needs due to cognition. Relies on daughter and caregiver to assist   Initial goal documentation         Follow Up Plan: SW will follow up with patient by phone over the next month.   Patricia Simmons, BSW, CDP Social Worker, Certified Dementia Practitioner [facility] [phone]  Total time spent performing care coordination and/or care management activities with the patient by phone or  face to face = 20 minutes.

## 2019-10-22 NOTE — Chronic Care Management (AMB) (Signed)
  Chronic Care Management   Outreach Note  10/22/2019 Name: Patricia Simmons MRN: [id number] DOB: 02/25/1936  Referred by: [name], FNP Reason for referral : Chronic Care Management (CCM RNCM Telephone Follow up )   An unsuccessful telephone outreach was attempted today. The patient was referred to the case management team by [name] FNP for assistance with care management and care coordination.   Follow Up Plan: Telephone follow up appointment with care management team member scheduled for: 12/04/19  [name], RN, BSN, CCM Care Management Coordinator [facility]  Direct Phone: [phone]

## 2019-10-23 ENCOUNTER — Ambulatory Visit (INDEPENDENT_AMBULATORY_CARE_PROVIDER_SITE_OTHER): Payer: Medicare Other

## 2019-10-23 DIAGNOSIS — F0390 Unspecified dementia without behavioral disturbance: Secondary | ICD-10-CM

## 2019-10-23 DIAGNOSIS — E876 Hypokalemia: Secondary | ICD-10-CM

## 2019-10-23 DIAGNOSIS — I10 Essential (primary) hypertension: Secondary | ICD-10-CM

## 2019-10-24 NOTE — Patient Instructions (Addendum)
Visit Information  Goals Addressed    . I am not sure if mother is taking Seroquel       Daughter Patricia Simmons stated Current Barriers:  Patricia Simmons Kitchen Knowledge Deficits related to Medication Management and familiarly of patient's prescribed medication regimen . Chronic Disease Management support and education needs related to Dementia  Nurse Case Manager Clinical Goal(s):  Patricia Simmons Kitchen Over the next 30 days, patient's daughter Patricia Simmons will work with the CCM team to address needs related to increasing knowledge and understanding of patients Medication regimen including the indication, dosage and frequency of each medication - 10/23/19 Goal Unknown - unable to reach patient's daughter for update  . 10/23/19- Over the next 60 days, patient's legal guardian who is also her daughter, Patricia Simmons will complete a comprehensive medication review with this RN to assess for knowledge and understanding of patients medication regimen, including indication, dosage and frequency  CCM RN CM Interventions:  10/23/19 Case Collaboration with embedded BSW Patricia Simmons   . Evaluation of current medication regimen and patient's adherence to plan as established by provider  . Per collaboration with embedded BSW Patricia Simmons patient's daughter Patricia Simmons now has legal guardianship over her mother's care . Reviewed 09/26/19 OV with provider Patricia Brine, FNP, patient is taking Seroquel as prescribed . Reviewed a medication dosage increase per Patricia Brine, FNP due to daughter Patricia Simmons reports ongoing hallucinations   Currently UNABLE TO independently perform self care  Please see past updates related to this goal by clicking on the "Past Updates" button in the selected goal      . I haven't noticed any Hallucinations       Daughter Patricia Simmons stated Current Barriers:  Patricia Simmons Kitchen Knowledge Deficits related to Dementia disease process and knowing what to expect with disease progression  . Cognitive Deficits  Nurse Case Manager Clinical Goal(s):  Patricia Simmons Kitchen Over  the next 30 days, patient will work with PCP provider to address needs related to Neurology referral for evaluation and treatment of Dementia  Goal Met . 10/22/2019: Over the next 30 days the patient will attend her scheduled neurology appointment to address ongoing changes to cognition hallucinations.  CCM SW Interventions Completed 10/22/2019 with Patricia Simmons . Outbound call placed to Patricia Simmons to review progression of patient goal . Discussed plans to follow up with Patricia Simmons on January 14 for initial evaluation . Collaboration with RN Case Manager to update on outcome of neurology referral  Patient Self Care Activities:  . Currently UNABLE TO independently perform Self Care  Please see past updates related to this goal by clicking on the "Past Updates" button in the selected goal      . To acheive a BP within target range of 130/80 or less than       Current Barriers:  Patricia Simmons Kitchen Knowledge Deficits related to disease process and Self Health Management of HTN . Cognitive Deficits . Chronic Disease Management support and education needs related to Hypertension   Nurse Case Manager Clinical Goal(s):  Patricia Simmons Kitchen Over the next 90 days, patient will verbalize basic understanding of Hypertension disease process and self health management plan as evidenced by patient's caregiver, Patricia Simmons will be able to monitor patient's BP at home and will understand when to call the CCM team and or PCP to report abnormal BP readings  Interventions:  . Evaluation of current treatment plan related to Hypertension as established by provider . Reviewed medications as prescribed by the provider and noted patient is prescribed to take Hydrochlorothiazide and Losartan  for treatment management of Hypertension . Provided patient's daughter Patricia Simmons with printed educational materials related to "What is High Blood Pressure", African-Americans and High Blood Pressure", Why I need to Restrict Sodium",  "Life's Simple 7" . CCM RN CM  plan to review patient educational materials at next scheduled follow up call   Patient Self Care Activities:   . Unable to independently perform Self Care  Initial goal documentation     . To maintain serum Potassium level within normal range per MD recommendation       Current Barriers:  Patricia Simmons Kitchen Knowledge Deficits related to treatment management of Hypokalemia . Cognitive Deficits . Chronic Disease Management support and education needs related to Hypokalemia  Nurse Case Manager Clinical Goal(s):  Patricia Simmons Kitchen Over the next 60 days, patient will work with the CCM team and PCP to address needs related to Hypokalemia  Interventions:  . Evaluation of current treatment plan related to Hypokalemia as established by provider . Reviewed medications and noted patient is currently not prescribed to take a K+ supplement and is prescribed to take HCTZ for BP control . Sent secure message to provider Patricia Brine, FNP making her aware . Provided patient with printed educational materials related to Potassium Rich Foods  Patient Self Care Activities:  . Unable to independently perform self care  Initial goal documentation        The patient verbalized understanding of instructions provided today and declined a print copy of patient instruction materials.   Telephone follow up appointment with care management team member scheduled for: 12/04/19  Patricia Merino, RN, BSN, CCM Care Management Coordinator [place] [facility]  Direct Phone: [phone]

## 2019-10-24 NOTE — Chronic Care Management (AMB) (Signed)
Chronic Care Management   Follow Up Note   10/24/2019 Name: Patricia Simmons MRN: [id number] DOB: 01-24-1936  Referred by: [name], FNP Reason for referral : Chronic Care Management (CCM RNCM Case Collaboration )   Patricia Simmons is a 83 y.o. year old female who is a primary care patient of [name], [place]. The CCM team was consulted for assistance with chronic disease management and care coordination needs.    Review of patient status, including review of consultants reports, relevant laboratory and other test results, and collaboration with appropriate care team members and the patient's provider was performed as part of comprehensive patient evaluation and provision of chronic care management services.    SDOH (Social Determinants of Health) screening performed today: Resources for the [facility] program. See Care Plan for related entries.   Outpatient Encounter Medications as of 10/23/2019  Medication Sig  . acetaminophen (TYLENOL) 500 MG tablet Take 1,000 mg by mouth every 6 (six) hours as needed for mild pain or headache.  . citalopram (CELEXA) 10 MG tablet Take 1 tablet by mouth once daily  . diclofenac sodium (VOLTAREN) 1 % GEL Apply 2 g topically 4 (four) times daily.  . hydrochlorothiazide (HYDRODIURIL) 12.5 MG tablet Take 2 tablets by mouth once daily  . losartan (COZAAR) 50 MG tablet Take 1 tablet (50 mg total) by mouth daily.  . magic mouthwash w/lidocaine SOLN Take 5 mLs by mouth 3 (three) times daily as needed for mouth pain.  [name] Kitchen omeprazole (PRILOSEC) 40 MG capsule Take 1 capsule (40 mg total) by mouth daily.  . QUEtiapine (SEROQUEL) 50 MG tablet Take 1 tablet (50 mg total) by mouth daily.  [name] Kitchen triamcinolone cream (KENALOG) 0.1 % Apply 1 application topically 2 (two) times daily.   No facility-administered encounter medications on file as of 10/23/2019.     Goals Addressed    . I am not sure if mother is taking Seroquel       Daughter [name] stated Current Barriers:    [name] Kitchen Knowledge Deficits related to Medication Management and familiarly of patient's prescribed medication regimen . Chronic Disease Management support and education needs related to Dementia  Nurse Case Manager Clinical Goal(s):  [name] Kitchen Over the next 30 days, patient's daughter [name] will work with the CCM team to address needs related to increasing knowledge and understanding of patients Medication regimen including the indication, dosage and frequency of each medication - 10/23/19 Goal Unknown - unable to reach patient's daughter for update  . 10/23/19- Over the next 60 days, patient's legal guardian who is also her daughter, Mescal Flinchbaugh will complete a comprehensive medication review with this RN to assess for knowledge and understanding of patients medication regimen, including indication, dosage and frequency  CCM RN CM Interventions:  10/23/19 Case Collaboration with embedded BSW Kendra Humble   . Evaluation of current medication regimen and patient's adherence to plan as established by provider  . Per collaboration with embedded BSW Daneen Schick patient's daughter Haadiya Frogge now has legal guardianship over her mother's care . Reviewed 09/26/19 OV with provider [name], FNP, patient is taking Seroquel as prescribed . Reviewed a medication dosage increase per [name], FNP due to daughter [name] reports ongoing hallucinations   Currently UNABLE TO independently perform self care  Please see past updates related to this goal by clicking on the "Past Updates" button in the selected goal      . I haven't noticed any Hallucinations       Daughter [name]  stated Current Barriers:  [name] Kitchen Knowledge Deficits related to Dementia disease process and knowing what to expect with disease progression  . Cognitive Deficits  Nurse Case Manager Clinical Goal(s):  [name] Kitchen Over the next 30 days, patient will work with PCP provider to address needs related to Neurology referral for evaluation and treatment of  Dementia  Goal Met . 10/22/2019: Over the next 30 days the patient will attend her scheduled neurology appointment to address ongoing changes to cognition hallucinations.  CCM SW Interventions Completed 10/22/2019 with [name] . Outbound call placed to [name] to review progression of patient goal . Discussed plans to follow up with Dr Jaynee Eagles on January 14 for initial evaluation . Collaboration with RN Case Manager to update on outcome of neurology referral  Patient Self Care Activities:  . Currently UNABLE TO independently perform Self Care  Please see past updates related to this goal by clicking on the "Past Updates" button in the selected goal      . To acheive a BP within target range of 130/80 or less than       Current Barriers:  [name] Kitchen Knowledge Deficits related to disease process and Self Health Management of HTN . Cognitive Deficits . Chronic Disease Management support and education needs related to Hypertension   Nurse Case Manager Clinical Goal(s):  [name] Kitchen Over the next 90 days, patient will verbalize basic understanding of Hypertension disease process and self health management plan as evidenced by patient's caregiver, Haniya Fern will be able to monitor patient's BP at home and will understand when to call the CCM team and or PCP to report abnormal BP readings  Interventions:  . Evaluation of current treatment plan related to Hypertension as established by provider . Reviewed medications as prescribed by the provider and noted patient is prescribed to take Hydrochlorothiazide and Losartan for treatment management of Hypertension . Provided patient's daughter [name] with printed educational materials related to "What is High Blood Pressure", African-Americans and High Blood Pressure", Why I need to Restrict Sodium",  "Life's Simple 7" . CCM RN CM plan to review patient educational materials at next scheduled follow up call   Patient Self Care Activities:   . Unable to  independently perform Self Care  Initial goal documentation     . To maintain serum Potassium level within normal range per MD recommendation       Current Barriers:  [name] Kitchen Knowledge Deficits related to treatment management of Hypokalemia . Cognitive Deficits . Chronic Disease Management support and education needs related to Hypokalemia  Nurse Case Manager Clinical Goal(s):  [name] Kitchen Over the next 60 days, patient will work with the CCM team and PCP to address needs related to Hypokalemia  Interventions:  . Evaluation of current treatment plan related to Hypokalemia as established by provider . Reviewed medications and noted patient is currently not prescribed to take a K+ supplement and is prescribed to take HCTZ for BP control . Sent secure message to provider [name], FNP making her aware . Provided patient with printed educational materials related to Potassium Rich Foods  Patient Self Care Activities:  . Unable to independently perform self care  Initial goal documentation       Telephone follow up appointment with care management team member scheduled for: 12/04/19  [name], RN, BSN, CCM Care Management Coordinator [place] [facility]  Direct Phone: [phone]

## 2019-11-12 ENCOUNTER — Ambulatory Visit: Payer: Self-pay

## 2019-11-12 ENCOUNTER — Telehealth: Payer: Self-pay

## 2019-11-12 DIAGNOSIS — F0390 Unspecified dementia without behavioral disturbance: Secondary | ICD-10-CM

## 2019-11-12 NOTE — Chronic Care Management (AMB) (Signed)
  Chronic Care Management   Outreach Note  11/12/2019 Name: Patricia Simmons MRN: [id number] DOB: 02-16-1936  Referred by: Arnette Felts, FNP Reason for referral : Care Coordination   SW placed an unsuccessful outbound call to the patients daughter and caregiver Carlisle Torgeson to assist with care coordination needs. SW left a HIPAA compliant voice message requesting a return call.   Follow Up Plan: The care management team will reach out to the patient again over the next 10 days.   Bevelyn Ngo, BSW, CDP Social Worker, Certified Dementia Practitioner [facility] Management [phone]

## 2019-11-18 ENCOUNTER — Other Ambulatory Visit: Payer: Self-pay | Admitting: Nurse Practitioner

## 2019-11-18 DIAGNOSIS — F32A Depression, unspecified: Secondary | ICD-10-CM

## 2019-11-18 DIAGNOSIS — F329 Major depressive disorder, single episode, unspecified: Secondary | ICD-10-CM

## 2019-11-20 ENCOUNTER — Telehealth: Payer: Self-pay

## 2019-11-20 ENCOUNTER — Ambulatory Visit: Payer: Self-pay

## 2019-11-20 DIAGNOSIS — F0390 Unspecified dementia without behavioral disturbance: Secondary | ICD-10-CM

## 2019-11-20 NOTE — Chronic Care Management (AMB) (Signed)
  Chronic Care Management   Outreach Note  11/20/2019 Name: Patricia Simmons MRN: [id number] DOB: 08-21-1936  Referred by: Arnette Felts, FNP Reason for referral : Care Coordination   A second unsuccessful telephone outreach was attempted today. The patient was referred to the case management team for assistance with care management and care coordination.   Follow Up Plan: A HIPPA compliant phone message was left for the patients daughter and primary caregiver Patricia Simmons providing contact information and requesting a return call.  The care management team will reach out to the patient again over the next 14 days.   Bevelyn Ngo, BSW, CDP Social Worker, Certified Dementia Practitioner [facility] Management [phone]

## 2019-11-22 ENCOUNTER — Ambulatory Visit: Payer: Medicare Other | Admitting: Neurology

## 2019-11-29 ENCOUNTER — Telehealth: Payer: Self-pay

## 2019-11-29 ENCOUNTER — Ambulatory Visit: Payer: Self-pay

## 2019-11-29 DIAGNOSIS — F0390 Unspecified dementia without behavioral disturbance: Secondary | ICD-10-CM

## 2019-11-29 NOTE — Chronic Care Management (AMB) (Signed)
  Chronic Care Management   Outreach Note  11/29/2019 Name: Patricia Simmons MRN: [id number] DOB: [birth year]  Referred by: Arnette Felts, FNP Reason for referral : Care Coordination   Third unsuccessful outbound call placed to the patients daughter and caregiver, Anglea Gordner, to assist with care coordination needs. SW left a HIPAA compliant voice message requesting a return call.  Follow Up Plan: SW will attempt a final outreach over the next 14 days prior to closing goal.  Bevelyn Ngo, BSW, CDP Social Worker, Certified Dementia Practitioner [facility] Care Management [phone]

## 2019-12-04 ENCOUNTER — Telehealth: Payer: Self-pay

## 2019-12-12 ENCOUNTER — Ambulatory Visit: Payer: Self-pay

## 2019-12-12 DIAGNOSIS — F0390 Unspecified dementia without behavioral disturbance: Secondary | ICD-10-CM

## 2019-12-12 NOTE — Patient Instructions (Signed)
Social Worker Visit Information  Goals we discussed today:  Goals Addressed            This Visit's Progress   . COMPLETED: We are thinking about enrolling in [facility] so she can have some interaction       Daughter stated:  Current Barriers:  . ADL/iADL limitation due to memory loss . Ongoing care management needs due to chronic conditions including HTN and Hypokalemia . Knowledge deficits related to the [facility] enrollment process   Social Work Clinical Goal(s):  [name] Kitchen Over the next 30 days the patient and her daughter will work with SW to become more knowledgeable of [facility] program . Over the next 45 days the patient and her daughter will have a better understanding of caregiver resources to assist with ADL needs  CCM SW Interventions: Completed 12/12/2019 with Micael Hampshire . Outbound call placed to the patients daughter and caregiver Rhodia Acres to assist with care coordination . Determined Mrs. Desaulniers is no longer interested in [facility] or [facility] under Medicaid benefit at this time . Advised Mrs. Bussie to contact CM SW in the future if assistance is needed  Patient Self Care Activities:  . Unable to manage self care needs due to cognition. Relies on daughter and caregiver to assist   Please see past updates related to this goal by clicking on the "Past Updates" button in the selected goal          Follow Up Plan: No further SW follow up planned at this time. The patient will remain active with RN Case Manager.   Bevelyn Ngo, BSW, CDP Social Worker, Certified Dementia Practitioner [facility] Care Management [phone]

## 2019-12-12 NOTE — Chronic Care Management (AMB) (Signed)
Chronic Care Management    Social Work Follow Up Note  12/12/2019 Name: Patricia Simmons MRN: [id number] DOB: [birth year]  Patricia Simmons is a 84 y.o. year old female who is a primary care patient of [name], [place]. The CCM team was consulted for assistance with care coordination.   Review of patient status, including review of consultants reports, other relevant assessments, and collaboration with appropriate care team members and the patient's provider was performed as part of comprehensive patient evaluation and provision of chronic care management services.    SW placed a successful outbound call to the patients daughter and primary caregiver Manon Banbury to follow up on goal progression.  Outpatient Encounter Medications as of 12/12/2019  Medication Sig  . acetaminophen (TYLENOL) 500 MG tablet Take 1,000 mg by mouth every 6 (six) hours as needed for mild pain or headache.  . citalopram (CELEXA) 10 MG tablet Take 1 tablet by mouth once daily  . diclofenac sodium (VOLTAREN) 1 % GEL Apply 2 g topically 4 (four) times daily.  . hydrochlorothiazide (HYDRODIURIL) 12.5 MG tablet Take 2 tablets by mouth once daily  . losartan (COZAAR) 50 MG tablet Take 1 tablet (50 mg total) by mouth daily.  . magic mouthwash w/lidocaine SOLN Take 5 mLs by mouth 3 (three) times daily as needed for mouth pain.  [name] Kitchen omeprazole (PRILOSEC) 40 MG capsule Take 1 capsule (40 mg total) by mouth daily.  . QUEtiapine (SEROQUEL) 50 MG tablet Take 1 tablet (50 mg total) by mouth daily.  [name] Kitchen triamcinolone cream (KENALOG) 0.1 % Apply 1 application topically 2 (two) times daily.   No facility-administered encounter medications on file as of 12/12/2019.     Goals Addressed            This Visit's Progress   . COMPLETED: We are thinking about enrolling in [facility] so she can have some interaction       Daughter stated:  Current Barriers:  . ADL/iADL limitation due to memory loss . Ongoing care management needs due to chronic  conditions including HTN and Hypokalemia . Knowledge deficits related to the [facility] enrollment process   Social Work Clinical Goal(s):  [name] Kitchen Over the next 30 days the patient and her daughter will work with SW to become more knowledgeable of [facility] program . Over the next 45 days the patient and her daughter will have a better understanding of caregiver resources to assist with ADL needs  CCM SW Interventions: Completed 12/12/2019 with [name] . Outbound call placed to the patients daughter and caregiver Averi Kilty to assist with care coordination . Determined Patricia Simmons is no longer interested in [facility] or [facility] under Medicaid benefit at this time . Advised Patricia Simmons to contact CM SW in the future if assistance is needed  Patient Self Care Activities:  . Unable to manage self care needs due to cognition. Relies on daughter and caregiver to assist   Please see past updates related to this goal by clicking on the "Past Updates" button in the selected goal          Follow Up Plan: No further SW follow up planned at this time. The patient will remain active with CM RN Case Manager.   Daneen Schick, BSW, CDP Social Worker, Certified Dementia Practitioner [place] [facility] [phone]  Total time spent performing care coordination and/or care management activities with the patient by phone or face to face =  15 minutes.

## 2019-12-13 IMAGING — CR DG CHEST 2V
1 series · 2 of 2 positions shown · non-contrast
Comparison: None.

CLINICAL DATA: Dizziness

EXAM:
CHEST - 2 VIEW

[Series 1: dg chest 2 view · 0.14mm/px · 2 of 2 slices shown]
[im 1/2]
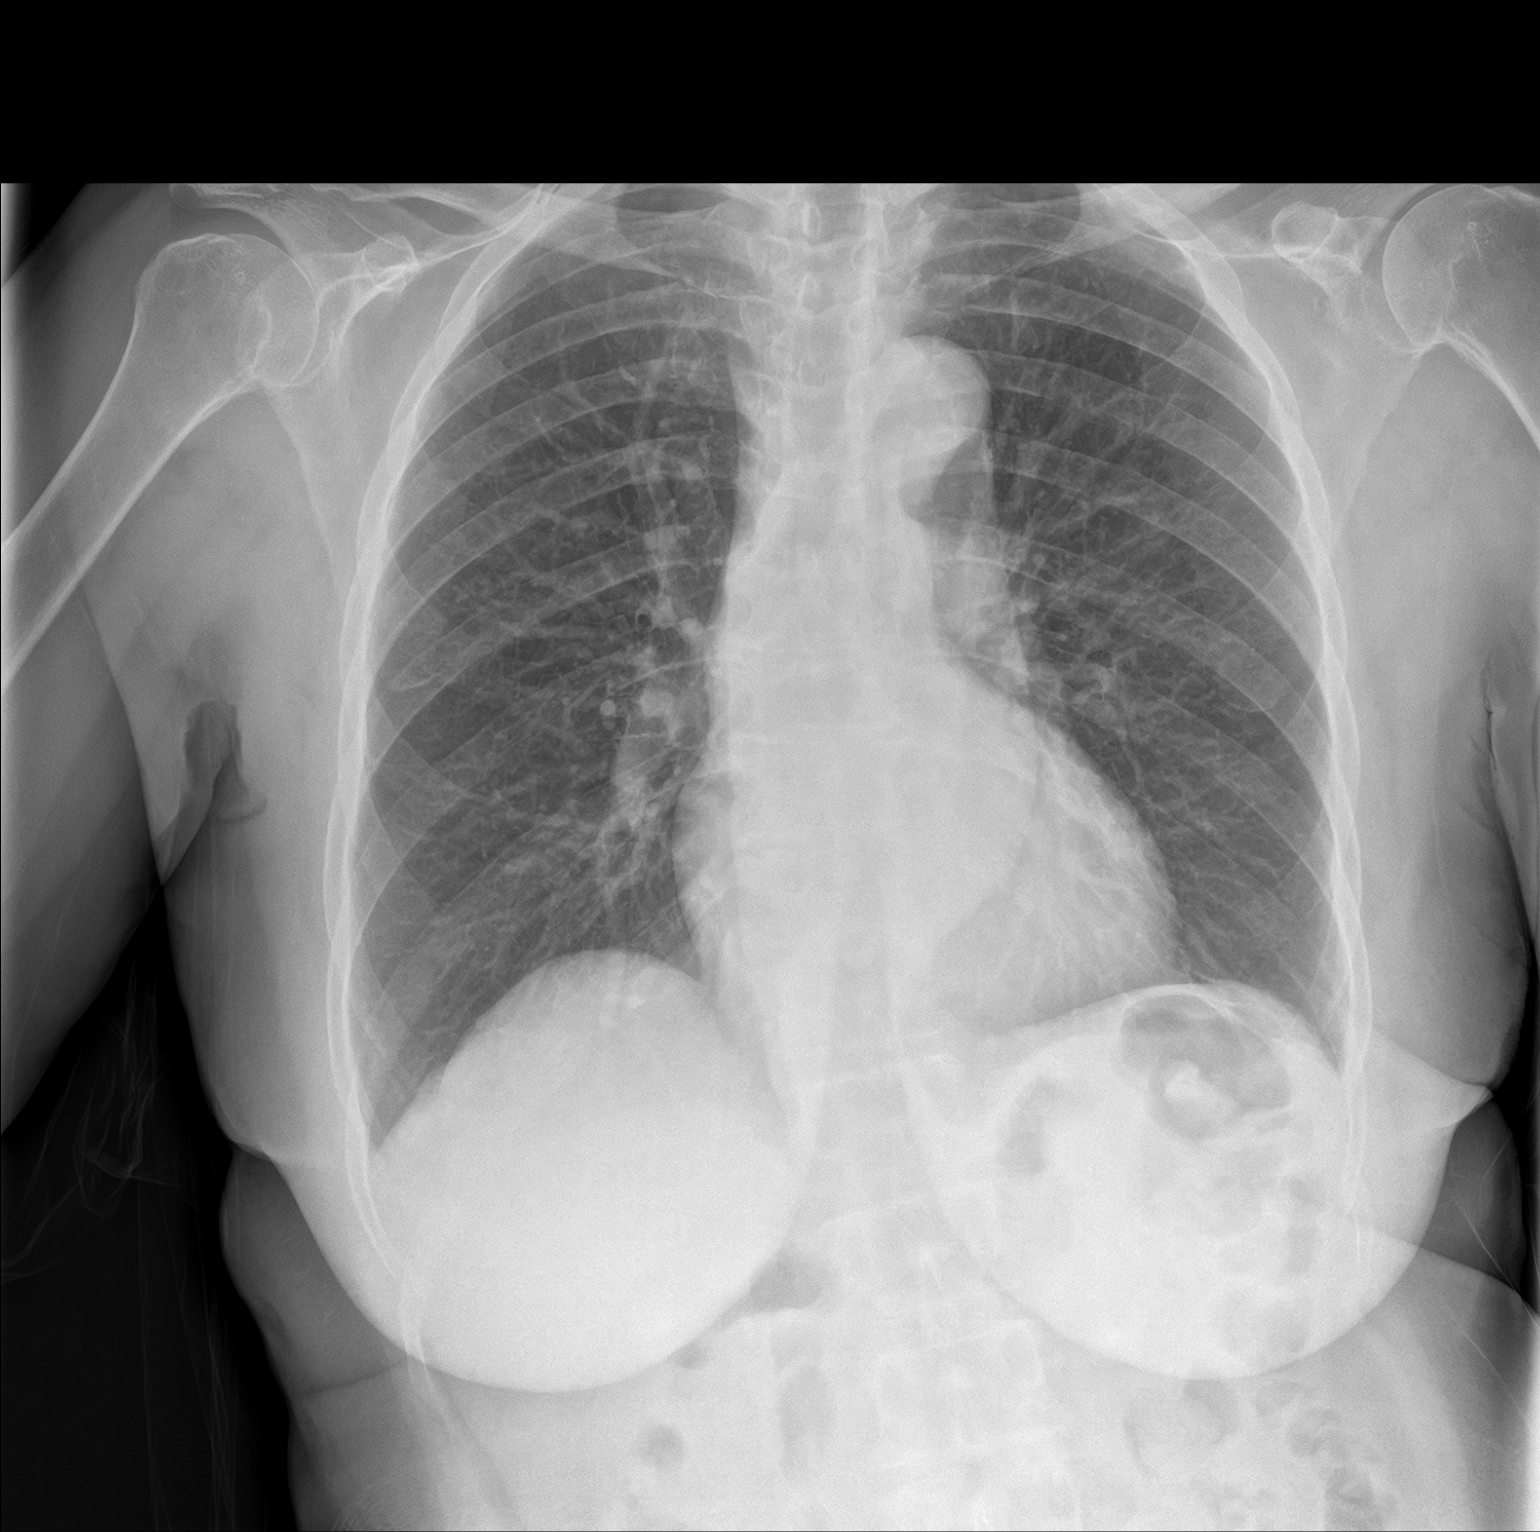
[im 2/2]
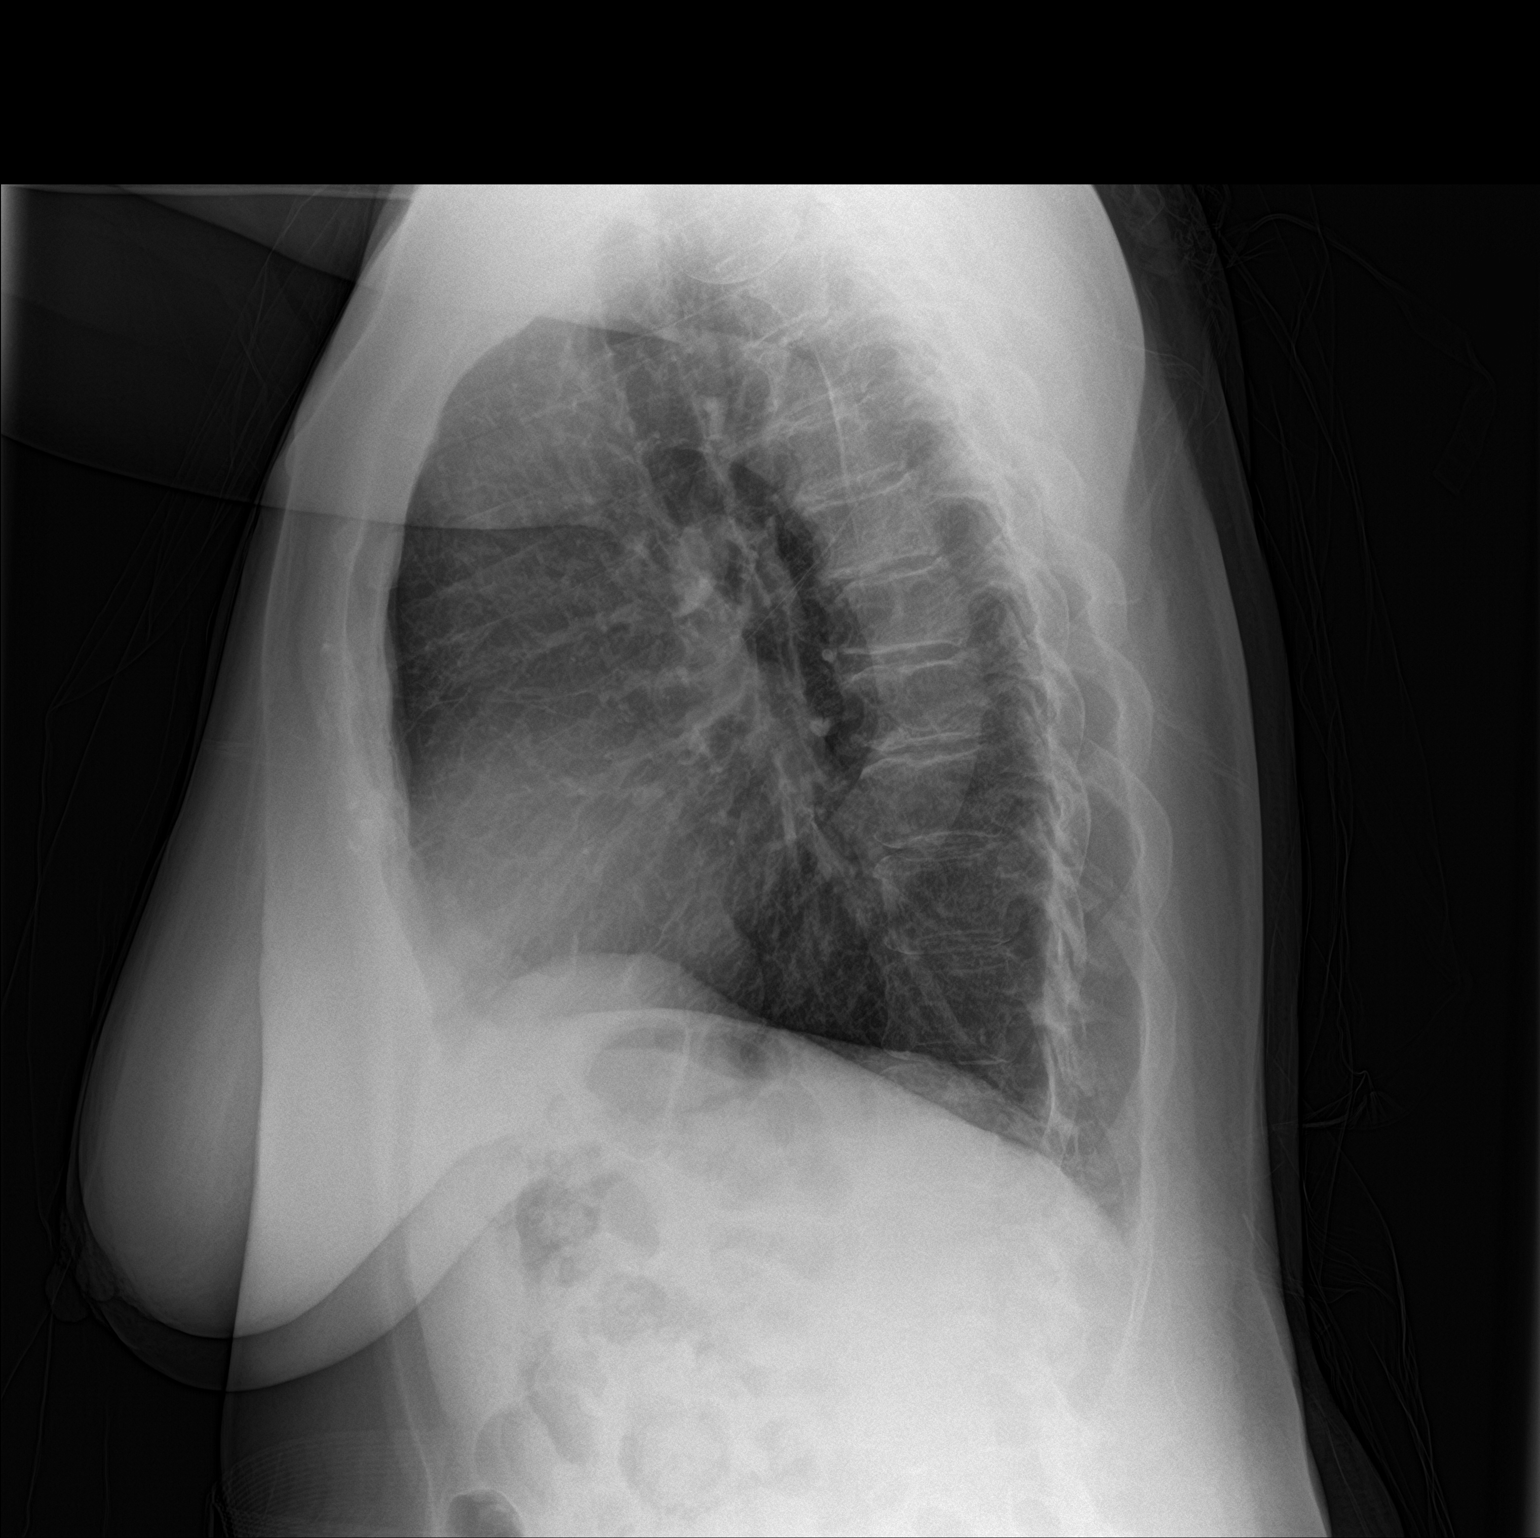

[2 of 2 positions shown; findings below may reference images not displayed]

FINDINGS: Normal heart size. Aortic tortuosity. Mild atelectasis or scarring
at the left base. There is no edema, consolidation, effusion, or
pneumothorax.
IMPRESSION: No evidence of acute disease.

## 2019-12-27 ENCOUNTER — Ambulatory Visit: Payer: Medicare Other | Admitting: Nurse Practitioner

## 2020-01-01 ENCOUNTER — Ambulatory Visit: Payer: Medicare Other | Admitting: Nurse Practitioner

## 2020-01-02 ENCOUNTER — Other Ambulatory Visit: Payer: Self-pay

## 2020-01-02 ENCOUNTER — Ambulatory Visit (INDEPENDENT_AMBULATORY_CARE_PROVIDER_SITE_OTHER): Payer: Medicare Other | Admitting: Nurse Practitioner

## 2020-01-02 ENCOUNTER — Encounter: Payer: Self-pay | Admitting: Nurse Practitioner

## 2020-01-02 VITALS — BP 130/88 | HR 72 | Temp 98.6°F | Ht 63.6 in | Wt 157.8 lb

## 2020-01-02 DIAGNOSIS — R443 Hallucinations, unspecified: Secondary | ICD-10-CM

## 2020-01-02 DIAGNOSIS — Z23 Encounter for immunization: Secondary | ICD-10-CM

## 2020-01-02 DIAGNOSIS — K219 Gastro-esophageal reflux disease without esophagitis: Secondary | ICD-10-CM

## 2020-01-02 DIAGNOSIS — F329 Major depressive disorder, single episode, unspecified: Secondary | ICD-10-CM

## 2020-01-02 DIAGNOSIS — E2839 Other primary ovarian failure: Secondary | ICD-10-CM

## 2020-01-02 DIAGNOSIS — F0390 Unspecified dementia without behavioral disturbance: Secondary | ICD-10-CM | POA: Diagnosis not present

## 2020-01-02 DIAGNOSIS — F32A Depression, unspecified: Secondary | ICD-10-CM

## 2020-01-02 DIAGNOSIS — I10 Essential (primary) hypertension: Secondary | ICD-10-CM

## 2020-01-02 MED ORDER — TETANUS-DIPHTH-ACELL PERTUSSIS 5-2.5-18.5 LF-MCG/0.5 IM SUSP: 0.5000 mL | Freq: Once | INTRAMUSCULAR | 0 refills | Status: AC

## 2020-01-02 MED ORDER — QUETIAPINE FUMARATE 50 MG PO TABS: 50.0000 mg | ORAL_TABLET | Freq: Every day | ORAL | 1 refills | Status: DC

## 2020-01-02 MED ORDER — CITALOPRAM HYDROBROMIDE 10 MG PO TABS: 10.0000 mg | ORAL_TABLET | Freq: Every day | ORAL | 1 refills | Status: DC

## 2020-01-02 MED ORDER — TETANUS-DIPHTH-ACELL PERTUSSIS 5-2.5-18.5 LF-MCG/0.5 IM SUSP: 0.5000 mL | Freq: Once | INTRAMUSCULAR | Status: DC

## 2020-01-02 MED ORDER — DONEPEZIL HCL 5 MG PO TABS: 5.0000 mg | ORAL_TABLET | Freq: Every day | ORAL | 2 refills | Status: DC

## 2020-01-02 MED ORDER — PNEUMOCOCCAL 13-VAL CONJ VACC IM SUSP: 0.5000 mL | INTRAMUSCULAR | 0 refills | Status: AC

## 2020-01-02 MED ORDER — OMEPRAZOLE 40 MG PO CPDR: 40.0000 mg | DELAYED_RELEASE_CAPSULE | Freq: Every day | ORAL | 1 refills | Status: DC

## 2020-01-02 NOTE — Progress Notes (Signed)
This visit occurred during the SARS-CoV-2 public health emergency.  Safety protocols were in place, including screening questions prior to the visit, additional usage of staff PPE, and extensive cleaning of exam room while observing appropriate contact time as indicated for disinfecting solutions.  Subjective:     Patient ID: Patricia Simmons , female    DOB: Oct 05, 1936 , 84 y.o.   MRN: [id number]   Chief Complaint  Patient presents with  . Memory Loss  . Hypertension    HPI  Her daughter reports she is fearful of things on the tv, her daughter puts the tv on channels that do not make her afraid.  No urinary incontinence.  Daughter feels she is communicating overall well occasional confusion.    Wt Readings from Last 3 Encounters: 01/02/20 : 157 lb 12.8 oz (71.6 kg) 09/26/19 : 160 lb (72.6 kg) 07/18/19 : 153 lb 12.8 oz (69.8 kg)   Hypertension This is a chronic problem. The current episode started more than 1 year ago. The problem is controlled. Pertinent negatives include no anxiety or blurred vision. There are no associated agents to hypertension. Risk factors for coronary artery disease include sedentary lifestyle. Past treatments include ACE inhibitors. There are no compliance problems.  There is no history of angina. There is no history of chronic renal disease.     Past Medical History:  Diagnosis Date  . Arthritis   . Blood transfusion without reported diagnosis   . Hypertension      Family History  Problem Relation Age of Onset  . Stroke Mother   . Hypertension Mother   . Prostate cancer Father   . Hypertension Sister   . Hyperlipidemia Sister   . Emphysema Brother   . Lung cancer Brother      Current Outpatient Medications:  .  acetaminophen (TYLENOL) 500 MG tablet, Take 1,000 mg by mouth every 6 (six) hours as needed for mild pain or headache., Disp: , Rfl:  .  citalopram (CELEXA) 10 MG tablet, Take 1 tablet by mouth once daily, Disp: 30 tablet, Rfl: 0 .   diclofenac sodium (VOLTAREN) 1 % GEL, Apply 2 g topically 4 (four) times daily., Disp: 1 Tube, Rfl: 2 .  hydrochlorothiazide (HYDRODIURIL) 12.5 MG tablet, Take 2 tablets by mouth once daily, Disp: 180 tablet, Rfl: 0 .  losartan (COZAAR) 50 MG tablet, Take 1 tablet (50 mg total) by mouth daily., Disp: 90 tablet, Rfl: 1 .  magic mouthwash w/lidocaine SOLN, Take 5 mLs by mouth 3 (three) times daily as needed for mouth pain., Disp: 120 mL, Rfl: 0 .  omeprazole (PRILOSEC) 40 MG capsule, Take 1 capsule (40 mg total) by mouth daily., Disp: 90 capsule, Rfl: 1 .  QUEtiapine (SEROQUEL) 50 MG tablet, Take 1 tablet (50 mg total) by mouth daily., Disp: 90 tablet, Rfl: 1 .  triamcinolone cream (KENALOG) 0.1 %, Apply 1 application topically 2 (two) times daily., Disp: 30 g, Rfl: 0   Allergies  Allergen Reactions  . Codeine     unknown     Review of Systems  Eyes: Negative for blurred vision.    Today's Vitals   01/02/20 1050  BP: 130/88  Pulse: 72  Temp: 98.6 F (37 C)  TempSrc: Oral  Weight: 157 lb 12.8 oz (71.6 kg)  Height: 5' 3.6" (1.615 m)  PainSc: 0-No pain   Body mass index is 27.43 kg/m.   Objective:  Physical Exam Vitals reviewed.  Constitutional:      Appearance: Normal appearance.  Cardiovascular:     Rate and Rhythm: Normal rate and regular rhythm.     Pulses: Normal pulses.     Heart sounds: Normal heart sounds. No murmur.  Pulmonary:     Effort: Pulmonary effort is normal. No respiratory distress.     Breath sounds: Normal breath sounds.  Skin:    Capillary Refill: Capillary refill takes less than 2 seconds.  Neurological:     General: No focal deficit present.     Mental Status: She is alert and oriented to person, place, and time.     Cranial Nerves: No cranial nerve deficit.  Psychiatric:        Mood and Affect: Mood normal.        Behavior: Behavior normal.        Thought Content: Thought content normal.        Judgment: Judgment normal.         Assessment  And Plan:     1. Dementia without behavioral disturbance, unspecified dementia type ([place])  Will add donepzil to her medications  Discussed side effects with her daughter - donepezil (ARICEPT) 5 MG tablet; Take 1 tablet (5 mg total) by mouth at bedtime.  Dispense: 30 tablet; Refill: 2  2. Depression, unspecified depression type  Continue with citalopram - citalopram (CELEXA) 10 MG tablet; Take 1 tablet (10 mg total) by mouth daily.  Dispense: 90 tablet; Refill: 1  3. Hallucination  This has been beneficial but continues to have hallucinations, will increase to 50 mg daily.  - QUEtiapine (SEROQUEL) 50 MG tablet; Take 1 tablet (50 mg total) by mouth daily.  Dispense: 90 tablet; Refill: 1  4. Gastroesophageal reflux disease without esophagitis  Chronic, continue with omprazole - omeprazole (PRILOSEC) 40 MG capsule; Take 1 capsule (40 mg total) by mouth daily.  Dispense: 90 capsule; Refill: 1  5. Decreased estrogen level  Will order bone density - DG Bone Density; Future  6. Essential hypertension  Chronic, fair controlled  Continue current medications  7. Encounter for immunization  Will give tetanus vaccine today while in office. Refer to order management. TDAP will be administered to adults 34-70 years old every 10 years. - Tdap (BOOSTRIX) injection 0.5 mL - pneumococcal 13-valent conjugate vaccine (PREVNAR 13) SUSP injection; Inject 0.5 mLs into the muscle tomorrow at 10 am for 1 dose.  Dispense: 0.5 mL; Refill: 0   [name], FNP    THE PATIENT IS ENCOURAGED TO PRACTICE SOCIAL DISTANCING DUE TO THE COVID-19 PANDEMIC.

## 2020-01-02 NOTE — Patient Instructions (Signed)
Dementia Dementia is a condition that affects the way the brain functions. It often affects memory and thinking. Usually, dementia gets worse with time and cannot be reversed (progressive dementia). There are many types of dementia, including:  Alzheimer's disease. This type is the most common.  Vascular dementia. This type may happen as the result of a stroke.  Lewy body dementia. This type may happen to people who have Parkinson's disease.  Frontotemporal dementia. This type is caused by damage to nerve cells (neurons) in certain parts of the brain. Some people may be affected by more than one type of dementia. This is called mixed dementia. What are the causes? Dementia is caused by damage to cells in the brain. The area of the brain and the types of cells damaged determine the type of dementia. Usually, this damage is irreversible or cannot be undone. Some examples of irreversible causes include:  Conditions that affect the blood vessels of the brain, such as diabetes, heart disease, or blood vessel disease.  Genetic mutations. In some cases, changes in the brain may be caused by another condition and can be reversed or slowed. Some examples of reversible causes include:  Injury to the brain.  Certain medicines.  Infection, such as meningitis.  Metabolic problems, such as vitamin B12 deficiency or thyroid disease.  Pressure on the brain, such as from a tumor or blood clot. What are the signs or symptoms? Symptoms of dementia depend on the type of dementia. Common signs of dementia include problems with remembering, thinking, problem solving, decision making, and communicating. These signs develop slowly or get worse with time. This may include:  Problems remembering things.  Having trouble taking a bath or putting clothes on.  Forgetting appointments.  Forgetting to pay bills.  Difficulty planning and preparing meals.  Having trouble speaking.  Getting lost easily. How  is this diagnosed? This condition is diagnosed by a specialist (neurologist). It is diagnosed based on the history of your symptoms, your medical history, a physical exam, and tests. Tests may include:  Tests to evaluate brain function, such as memory tests, cognitive tests, and other tests.  Lab tests, such as blood or urine tests.  Imaging tests, such as a CT scan, a PET scan, or an MRI.  Genetic testing. This may be done if other family members have a diagnosis of certain types of dementia. Your health care provider will talk with you and your family, friends, or caregivers about your history and symptoms. How is this treated?  Treatment for this condition depends on the cause of the dementia. Progressive dementias, such as Alzheimer's disease, cannot be cured, but there may be treatments that help to manage symptoms. Treatment might involve taking medicines that may help to:  Control the dementia.  Slow down the progression of the dementia.  Manage symptoms. In some cases, treating the cause of your dementia can improve symptoms, reverse symptoms, or slow down how quickly your dementia becomes worse. Your health care provider can direct you to support groups, organizations, and other health care providers who can help with decisions about your care. Follow these instructions at home: Medicines  Take over-the-counter and prescription medicines only as told by your health care provider.  Use a pill organizer or pill reminder to help you manage your medicines.  Avoid taking medicines that can affect thinking, such as pain medicines or sleeping medicines. Lifestyle  Make healthy lifestyle choices. ? Be physically active as told by your health care provider. ? Do   not use any products that contain nicotine or tobacco, such as cigarettes, e-cigarettes, and chewing tobacco. If you need help quitting, ask your health care provider. ? Do not drink alcohol. ? Practice stress-management  techniques when you get stressed. ? Spend time with other people.  Make sure to get quality sleep. These tips can help you get a good night's rest: ? Avoid napping during the day. ? Keep your sleeping area dark and cool. ? Avoid exercising during the few hours before you go to bed. ? Avoid caffeine products in the evening. Eating and drinking  Drink enough fluid to keep your urine pale yellow.  Eat a healthy diet. General instructions   Work with your health care provider to determine what you need help with and what your safety needs are.  Talk with your health care provider about whether it is safe for you to drive.  If you were given a bracelet that identifies you as a person with memory loss or tracks your location, make sure to wear it at all times.  Work with your family to make important decisions, such as advance directives, medical power of attorney, or a living will.  Keep all follow-up visits as told by your health care provider. This is important. Where to find more information  Alzheimer's Association: www.alz.org  National Institute on Aging: www.nia.nih.gov/alzheimers  World Health Organization: www.who.int Contact a health care provider if:  You have any new or worsening symptoms.  You have problems with choking or swallowing. Get help right away if:  You feel depressed or sad, or feel that you want to harm yourself.  Your family members become concerned for your safety. If you ever feel like you may hurt yourself or others, or have thoughts about taking your own life, get help right away. You can go to your nearest emergency department or call:  Your local emergency services (911 in the [place]  A suicide crisis helpline, such as the National Suicide Prevention Lifeline at 1-800-273-8255. This is open 24 hours a day. Summary  Dementia is a condition that affects the way the brain functions. Dementia often affects memory and thinking.  Usually,  dementia gets worse with time and cannot be reversed (progressive dementia).  Treatment for this condition depends on the cause of the dementia.  Work with your health care provider to determine what you need help with and what your safety needs are.  Your health care provider can direct you to support groups, organizations, and other health care providers who can help with decisions about your care. This information is not intended to replace advice given to you by your health care provider. Make sure you discuss any questions you have with your health care provider. Document Revised: 01/09/2019 Document Reviewed: 01/09/2019 Elsevier Patient Education  2020 [employer]vier Inc.  

## 2020-01-04 ENCOUNTER — Ambulatory Visit: Payer: Self-pay

## 2020-01-04 ENCOUNTER — Ambulatory Visit: Payer: Medicare Other | Attending: Internal Medicine

## 2020-01-04 DIAGNOSIS — F0390 Unspecified dementia without behavioral disturbance: Secondary | ICD-10-CM

## 2020-01-04 DIAGNOSIS — Z23 Encounter for immunization: Secondary | ICD-10-CM

## 2020-01-04 NOTE — Chronic Care Management (AMB) (Signed)
  Chronic Care Management   Outreach Note  01/04/2020 Name: Patricia Simmons MRN: [id number] DOB: [birth year]  Referred by: Arnette Felts, FNP Reason for referral : Care Coordination   SW received communication from patients provider requesting SW contact the patients daughter, Roxine Whittinghill, to assist with financial questions surrounding medical bills. Outbound call placed to Micael Hampshire who reports her mother has incurred some medical bills due to the patients other daughter Coy Saunas not providing the patient insurance information at time of service. SW advised Mrs. Leisure to contact the financial department to provide insurance information and request charges be re-processed. Mrs. Petrucci stated understanding.  Follow Up Plan: No SW follow up planned at this time. Mrs. Fiorillo is encouraged to contact SW with future resource needs.  Bevelyn Ngo, BSW, CDP Social Worker, Certified Dementia Practitioner [facility] [phone]

## 2020-01-04 NOTE — Progress Notes (Signed)
   Covid-19 Vaccination Clinic  Name:  LAKEYTA VANDENHEUVEL    MRN: [id number] DOB: 20-Jan-1936  01/04/2020  Ms. Cleek was observed post Covid-19 immunization for 15 minutes without incidence. She was provided with Vaccine Information Sheet and instruction to access the V-Safe system.   Ms. Teagle was instructed to call 911 with any severe reactions post vaccine: [name] Kitchen Difficulty breathing  . Swelling of your face and throat  . A fast heartbeat  . A bad rash all over your body  . Dizziness and weakness    Immunizations Administered    Name Date Dose VIS Date Route   Pfizer COVID-19 Vaccine 01/04/2020  3:45 PM 0.3 mL 10/19/2019 Intramuscular   Manufacturer: [employer], [employer]   Lot: [id number]   NDC: [address]-8

## 2020-01-09 ENCOUNTER — Telehealth: Payer: Self-pay

## 2020-01-20 ENCOUNTER — Encounter: Payer: Self-pay | Admitting: Nurse Practitioner

## 2020-01-20 MED ORDER — TETANUS-DIPHTH-ACELL PERTUSSIS 5-2.5-18.5 LF-MCG/0.5 IM SUSP: 0.5000 mL | Freq: Once | INTRAMUSCULAR | 0 refills | Status: AC

## 2020-01-23 NOTE — Progress Notes (Signed)
Lab visit scheduled. YL,RMA

## 2020-01-28 ENCOUNTER — Other Ambulatory Visit: Payer: Medicare Other

## 2020-01-28 ENCOUNTER — Other Ambulatory Visit: Payer: Self-pay

## 2020-01-28 DIAGNOSIS — I10 Essential (primary) hypertension: Secondary | ICD-10-CM | POA: Diagnosis not present

## 2020-01-29 ENCOUNTER — Encounter: Payer: Self-pay | Admitting: Neurology

## 2020-01-29 ENCOUNTER — Ambulatory Visit (INDEPENDENT_AMBULATORY_CARE_PROVIDER_SITE_OTHER): Payer: Medicare Other | Admitting: Neurology

## 2020-01-29 VITALS — BP 114/71 | HR 70 | Temp 98.2°F | Wt 161.0 lb

## 2020-01-29 DIAGNOSIS — F0281 Dementia in other diseases classified elsewhere with behavioral disturbance: Secondary | ICD-10-CM

## 2020-01-29 DIAGNOSIS — G301 Alzheimer's disease with late onset: Secondary | ICD-10-CM | POA: Diagnosis not present

## 2020-01-29 DIAGNOSIS — F0390 Unspecified dementia without behavioral disturbance: Secondary | ICD-10-CM | POA: Diagnosis not present

## 2020-01-29 DIAGNOSIS — F02818 Dementia in other diseases classified elsewhere, unspecified severity, with other behavioral disturbance: Secondary | ICD-10-CM

## 2020-01-29 DIAGNOSIS — F0391 Unspecified dementia with behavioral disturbance: Secondary | ICD-10-CM

## 2020-01-29 LAB — BMP8+EGFR
BUN/Creatinine Ratio: 10 — ABNORMAL LOW (ref 12–28)
BUN: 10 mg/dL (ref 8–27)
CO2: 28 mmol/L (ref 20–29)
Calcium: 10 mg/dL (ref 8.7–10.3)
Chloride: 98 mmol/L (ref 96–106)
Creatinine, Ser: 1.03 mg/dL — ABNORMAL HIGH (ref 0.57–1.00)
GFR calc Af Amer: 59 mL/min/{1.73_m2} — ABNORMAL LOW (ref >59)
GFR calc non Af Amer: 51 mL/min/{1.73_m2} — ABNORMAL LOW (ref >59)
Glucose: 83 mg/dL (ref 65–99)
Potassium: 3 mmol/L — ABNORMAL LOW (ref 3.5–5.2)
Sodium: 142 mmol/L (ref 134–144)

## 2020-01-29 MED ORDER — DONEPEZIL HCL 10 MG PO TABS: 10.0000 mg | ORAL_TABLET | Freq: Every day | ORAL | 6 refills | Status: DC

## 2020-01-29 MED ORDER — MEMANTINE HCL 10 MG PO TABS: ORAL_TABLET | ORAL | 6 refills | Status: DC

## 2020-01-29 NOTE — Addendum Note (Signed)
Addended by: Tamera Stands D on: 01/29/2020 01:20 PM   Modules accepted: Orders

## 2020-01-29 NOTE — Patient Instructions (Signed)
Increase Donepezil to 10mg  In 1-2 weeks can start the Memantine 10mg (1 pill) at bedtime. If no side effects in 1-2 weeks then increase to 1 pill twice daily  Memantine Tablets What is this medicine? MEMANTINE (MEM an teen) is used to treat dementia caused by Alzheimer's disease. This medicine may be used for other purposes; ask your health care provider or pharmacist if you have questions. COMMON BRAND NAME(S): Namenda What should I tell my health care provider before I take this medicine? They need to know if you have any of these conditions:  difficulty passing urine  kidney disease  liver disease  seizures  an unusual or allergic reaction to memantine, other medicines, foods, dyes, or preservatives  pregnant or trying to get pregnant  breast-feeding How should I use this medicine? Take this medicine by mouth with a glass of water. Follow the directions on the prescription label. You may take this medicine with or without food. Take your doses at regular intervals. Do not take your medicine more often than directed. Continue to take your medicine even if you feel better. Do not stop taking except on the advice of your doctor or health care professional. Talk to your pediatrician regarding the use of this medicine in children. Special care may be needed. Overdosage: If you think you have taken too much of this medicine contact a poison control center or emergency room at once. NOTE: This medicine is only for you. Do not share this medicine with others. What if I miss a dose? If you miss a dose, take it as soon as you can. If it is almost time for your next dose, take only that dose. Do not take double or extra doses. If you do not take your medicine for several days, contact your health care provider. Your dose may need to be changed. What may interact with this  medicine?  acetazolamide  amantadine  cimetidine  dextromethorphan  dofetilide  hydrochlorothiazide  ketamine  metformin  methazolamide  quinidine  ranitidine  sodium bicarbonate  triamterene This list may not describe all possible interactions. Give your health care provider a list of all the medicines, herbs, non-prescription drugs, or dietary supplements you use. Also tell them if you smoke, drink alcohol, or use illegal drugs. Some items may interact with your medicine. What should I watch for while using this medicine? Visit your doctor or health care professional for regular checks on your progress. Check with your doctor or health care professional if there is no improvement in your symptoms or if they get worse. You may get drowsy or dizzy. Do not drive, use machinery, or do anything that needs mental alertness until you know how this drug affects you. Do not stand or sit up quickly, especially if you are an older patient. This reduces the risk of dizzy or fainting spells. Alcohol can make you more drowsy and dizzy. Avoid alcoholic drinks. What side effects may I notice from receiving this medicine? Side effects that you should report to your doctor or health care professional as soon as possible:  allergic reactions like skin rash, itching or hives, swelling of the face, lips, or tongue  agitation or a feeling of restlessness  depressed mood  dizziness  hallucinations  redness, blistering, peeling or loosening of the skin, including inside the mouth  seizures  vomiting Side effects that usually do not require medical attention (report to your doctor or health care professional if they continue or are bothersome):  constipation  diarrhea  headache  nausea  trouble sleeping This list may not describe all possible side effects. Call your doctor for medical advice about side effects. You may report side effects to FDA at 1-800-FDA-1088. Where should I  keep my medicine? Keep out of the reach of children. Store at room temperature between 15 degrees and 30 degrees C (59 degrees and 86 degrees F). Throw away any unused medicine after the expiration date. NOTE: This sheet is a summary. It may not cover all possible information. If you have questions about this medicine, talk to your doctor, pharmacist, or health care provider.  2020 Elsevier/Gold Standard (2013-08-13 14:10:42) Donepezil tablets What is this medicine? DONEPEZIL (doe NEP e zil) is used to treat mild to moderate dementia caused by Alzheimer's disease. This medicine may be used for other purposes; ask your health care provider or pharmacist if you have questions. COMMON BRAND NAME(S): Aricept What should I tell my health care provider before I take this medicine? They need to know if you have any of these conditions:  asthma or other lung disease  difficulty passing urine  head injury  heart disease  history of irregular heartbeat  liver disease  seizures (convulsions)  stomach or intestinal disease, ulcers or stomach bleeding  an unusual or allergic reaction to donepezil, other medicines, foods, dyes, or preservatives  pregnant or trying to get pregnant  breast-feeding How should I use this medicine? Take this medicine by mouth with a glass of water. Follow the directions on the prescription label. You may take this medicine with or without food. Take this medicine at regular intervals. This medicine is usually taken before bedtime. Do not take it more often than directed. Continue to take your medicine even if you feel better. Do not stop taking except on your doctor's advice. If you are taking the 23 mg donepezil tablet, swallow it whole; do not cut, crush, or chew it. Talk to your pediatrician regarding the use of this medicine in children. Special care may be needed. Overdosage: If you think you have taken too much of this medicine contact a poison control  center or emergency room at once. NOTE: This medicine is only for you. Do not share this medicine with others. What if I miss a dose? If you miss a dose, take it as soon as you can. If it is almost time for your next dose, take only that dose, do not take double or extra doses. What may interact with this medicine? Do not take this medicine with any of the following medications:  certain medicines for fungal infections like itraconazole, fluconazole, posaconazole, and voriconazole  cisapride  dextromethorphan; quinidine  dronedarone  pimozide  quinidine  thioridazine This medicine may also interact with the following medications:  antihistamines for allergy, cough and cold  atropine  bethanechol  carbamazepine  certain medicines for bladder problems like oxybutynin, tolterodine  certain medicines for Parkinson's disease like benztropine, trihexyphenidyl  certain medicines for stomach problems like dicyclomine, hyoscyamine  certain medicines for travel sickness like scopolamine  dexamethasone  dofetilide  ipratropium  NSAIDs, medicines for pain and inflammation, like ibuprofen or naproxen  other medicines for Alzheimer's disease  other medicines that prolong the QT interval (cause an abnormal heart rhythm)  phenobarbital  phenytoin  rifampin, rifabutin or rifapentine  ziprasidone This list may not describe all possible interactions. Give your health care provider a list of all the medicines, herbs, non-prescription drugs, or dietary supplements you use. Also tell them if you smoke, drink  alcohol, or use illegal drugs. Some items may interact with your medicine. What should I watch for while using this medicine? Visit your doctor or health care professional for regular checks on your progress. Check with your doctor or health care professional if your symptoms do not get better or if they get worse. You may get drowsy or dizzy. Do not drive, use machinery, or  do anything that needs mental alertness until you know how this drug affects you. What side effects may I notice from receiving this medicine? Side effects that you should report to your doctor or health care professional as soon as possible:  allergic reactions like skin rash, itching or hives, swelling of the face, lips, or tongue  feeling faint or lightheaded, falls  loss of bladder control  seizures  signs and symptoms of a dangerous change in heartbeat or heart rhythm like chest pain; dizziness; fast or irregular heartbeat; palpitations; feeling faint or lightheaded, falls; breathing problems  signs and symptoms of infection like fever or chills; cough; sore throat; pain or trouble passing urine  signs and symptoms of liver injury like dark yellow or Shin urine; general ill feeling or flu-like symptoms; light-colored stools; loss of appetite; nausea; right upper belly pain; unusually weak or tired; yellowing of the eyes or skin  slow heartbeat or palpitations  unusual bleeding or bruising  vomiting Side effects that usually do not require medical attention (report to your doctor or health care professional if they continue or are bothersome):  diarrhea, especially when starting treatment  headache  loss of appetite  muscle cramps  nausea  stomach upset This list may not describe all possible side effects. Call your doctor for medical advice about side effects. You may report side effects to FDA at 1-800-FDA-1088. Where should I keep my medicine? Keep out of reach of children. Store at room temperature between 15 and 30 degrees C (59 and 86 degrees F). Throw away any unused medicine after the expiration date. NOTE: This sheet is a summary. It may not cover all possible information. If you have questions about this medicine, talk to your doctor, pharmacist, or health care provider.  2020 Elsevier/Gold Standard (2018-10-16 10:33:41)    Dementia Dementia is a  condition that affects the way the brain functions. It often affects memory and thinking. Usually, dementia gets worse with time and cannot be reversed (progressive dementia). There are many types of dementia, including:  Alzheimer's disease. This type is the most common.  Vascular dementia. This type may happen as the result of a stroke.  Lewy body dementia. This type may happen to people who have Parkinson's disease.  Frontotemporal dementia. This type is caused by damage to nerve cells (neurons) in certain parts of the brain. Some people may be affected by more than one type of dementia. This is called mixed dementia. What are the causes? Dementia is caused by damage to cells in the brain. The area of the brain and the types of cells damaged determine the type of dementia. Usually, this damage is irreversible or cannot be undone. Some examples of irreversible causes include:  Conditions that affect the blood vessels of the brain, such as diabetes, heart disease, or blood vessel disease.  Genetic mutations. In some cases, changes in the brain may be caused by another condition and can be reversed or slowed. Some examples of reversible causes include:  Injury to the brain.  Certain medicines.  Infection, such as meningitis.  Metabolic problems, such as vitamin  B12 deficiency or thyroid disease.  Pressure on the brain, such as from a tumor or blood clot. What are the signs or symptoms? Symptoms of dementia depend on the type of dementia. Common signs of dementia include problems with remembering, thinking, problem solving, decision making, and communicating. These signs develop slowly or get worse with time. This may include:  Problems remembering things.  Having trouble taking a bath or putting clothes on.  Forgetting appointments.  Forgetting to pay bills.  Difficulty planning and preparing meals.  Having trouble speaking.  Getting lost easily. How is this  diagnosed? This condition is diagnosed by a specialist (neurologist). It is diagnosed based on the history of your symptoms, your medical history, a physical exam, and tests. Tests may include:  Tests to evaluate brain function, such as memory tests, cognitive tests, and other tests.  Lab tests, such as blood or urine tests.  Imaging tests, such as a CT scan, a PET scan, or an MRI.  Genetic testing. This may be done if other family members have a diagnosis of certain types of dementia. Your health care provider will talk with you and your family, friends, or caregivers about your history and symptoms. How is this treated?  Treatment for this condition depends on the cause of the dementia. Progressive dementias, such as Alzheimer's disease, cannot be cured, but there may be treatments that help to manage symptoms. Treatment might involve taking medicines that may help to:  Control the dementia.  Slow down the progression of the dementia.  Manage symptoms. In some cases, treating the cause of your dementia can improve symptoms, reverse symptoms, or slow down how quickly your dementia becomes worse. Your health care provider can direct you to support groups, organizations, and other health care providers who can help with decisions about your care. Follow these instructions at home: Medicines  Take over-the-counter and prescription medicines only as told by your health care provider.  Use a pill organizer or pill reminder to help you manage your medicines.  Avoid taking medicines that can affect thinking, such as pain medicines or sleeping medicines. Lifestyle  Make healthy lifestyle choices. ? Be physically active as told by your health care provider. ? Do not use any products that contain nicotine or tobacco, such as cigarettes, e-cigarettes, and chewing tobacco. If you need help quitting, ask your health care provider. ? Do not drink alcohol. ? Practice stress-management  techniques when you get stressed. ? Spend time with other people.  Make sure to get quality sleep. These tips can help you get a good night's rest: ? Avoid napping during the day. ? Keep your sleeping area dark and cool. ? Avoid exercising during the few hours before you go to bed. ? Avoid caffeine products in the evening. Eating and drinking  Drink enough fluid to keep your urine pale yellow.  Eat a healthy diet. General instructions   Work with your health care provider to determine what you need help with and what your safety needs are.  Talk with your health care provider about whether it is safe for you to drive.  If you were given a bracelet that identifies you as a person with memory loss or tracks your location, make sure to wear it at all times.  Work with your family to make important decisions, such as advance directives, medical power of attorney, or a living will.  Keep all follow-up visits as told by your health care provider. This is important. Where to find  more information  Alzheimer's Association: LimitLaws.huwww.alz.org  [employer]ging: CashCowGambling.bewww.nia.nih.gov/alzheimers  World Health Organization: https://castaneda-walker.com/www.who.int Contact a health care provider if:  You have any new or worsening symptoms.  You have problems with choking or swallowing. Get help right away if:  You feel depressed or sad, or feel that you want to harm yourself.  Your family members become concerned for your safety. If you ever feel like you may hurt yourself or others, or have thoughts about taking your own life, get help right away. You can go to your nearest emergency department or call:  Your local emergency services (911 in the [place]  A suicide crisis helpline, such as the National Suicide Prevention Lifeline at 778-879-53751-838-298-9862. This is open 24 hours a day. Summary  Dementia is a condition that affects the way the brain functions. Dementia often affects memory and thinking.  Usually,  dementia gets worse with time and cannot be reversed (progressive dementia).  Treatment for this condition depends on the cause of the dementia.  Work with your health care provider to determine what you need help with and what your safety needs are.  Your health care provider can direct you to support groups, organizations, and other health care providers who can help with decisions about your care. This information is not intended to replace advice given to you by your health care provider. Make sure you discuss any questions you have with your health care provider. Document Revised: 01/09/2019 Document Reviewed: 01/09/2019 Elsevier Patient Education  2020 [employer]

## 2020-01-29 NOTE — Progress Notes (Signed)
[facility]    Provider:  Dr Jaynee Eagles Requesting Provider: [name], FNP Primary Care Provider:  [name], FNP  CC:  Dementia  HPI:  Patricia Simmons is a 84 y.o. female here as requested by [name], [place] for mild cognitive impairment.  Past medical history dementia,  essential hypertension, mild cognitive impairment, hallucinations.  I reviewed Patricia Simmons's notes: Patient was initially seen last year for hallucinations, she was started on Seroquel, she reported in August 2020 that she has been hallucinating for the past 3 to 4 months and she would be in her room talking herself and ask her daughter she sees the people in her room, at that time she was started on Seroquel.  At follow-up, daughter stated sometimes will hallucinate a little bit but not as bad as before, sleeping more during the day with the Seroquel, sleep sleeping well at night, not staying at home by herself anymore.  Patient is on Seroquel for hallucinations, she staying with her daughter, daughter feels she is not having any incontinence episodes, at times while she is watching TV she will wave the people and say they are watching her, the medication is helping but still having frequent hallucinations, she does well with her bathing and dressing once daughter lays out her clothes, overall she is doing well with prompting for ADLs.  She was started on Seroquel for hallucinations and at last visit increased to 50 mg.  Daughter provides most information. Going on at least 2 years. She was tearing up paper, she had thrown her teeth away this was 2 years ago, she has had a rough time in life, she was a caretaker. She has tremors. She is also having visual hallucinations. She can bather herself. She needs help with her ADLs. Daughter needs to lay out her clothes and help her, but she can dress herself. She lives with her daughter. She eats well. She is incontinent. She will fold up paper towels and she will  put it in her pocketbook or will put food she didn't wat in her pocketbook. She knows when it is Sunday. She I sleeping well. Not wandering. No falls with this daughter, she fell last year at other daughter's home she fell off a chair. Her father had parkinson's disease. She can walk stairs very well. She may  Use a cane or a scooter. She can walk well. She has her nails done every month and a pedicure. She is very pleasant, everyone loves her. She has had hallucinations for at least a year maybe longer.    Reviewed notes, labs and imaging from outside physicians, which showed:  Personally reviewed imaging and agree with the following:  CT head 01/19/2019:personally reviewed imaging and agree with the following: Mild atrophy with minimal periventricular small vessel disease. No acute infarct. No mass or hemorrhage.  01/28/2020: Cr1.03, , BUN 10  Review of Systems: Patient complains of symptoms per HPI as well as the following symptoms:dementia. Pertinent negatives and positives per HPI. All others negative.   Social History   Socioeconomic History  . Marital status: Single    Spouse name: Not on file  . Number of children: 5  . Years of education: unknown  . Highest education level: Not on file  Occupational History  . Occupation: retired  Tobacco Use  . Smoking status: Never Smoker  . Smokeless tobacco: Never Used  Substance and Sexual Activity  . Alcohol use: No  . Drug use: No  . Sexual activity:  Not Currently  Other Topics Concern  . Not on file  Social History Narrative   Lives with her daughter [name]   Right handed   Social Determinants of Health   Financial Resource Strain: Low Risk   . Difficulty of Paying Living Expenses: Not very hard  Food Insecurity: No Food Insecurity  . Worried About Programme researcher, broadcasting/film/video in the Last Year: Never true  . Ran Out of Food in the Last Year: Never true  Transportation Needs: No Transportation Needs  . Lack of Transportation  (Medical): No  . Lack of Transportation (Non-Medical): No  Physical Activity: Inactive  . Days of Exercise per Week: 0 days  . Minutes of Exercise per Session: 0 min  Stress: No Stress Concern Present  . Feeling of Stress : Not at all  Social Connections:   . Frequency of Communication with Friends and Family:   . Frequency of Social Gatherings with Friends and Family:   . Attends Religious Services:   . Active Member of Clubs or Organizations:   . Attends Banker Meetings:   [name] Kitchen Marital Status:   Intimate Partner Violence: Not At Risk  . Fear of Current or Ex-Partner: No  . Emotionally Abused: No  . Physically Abused: No  . Sexually Abused: No    Family History  Problem Relation Age of Onset  . Stroke Mother   . Hypertension Mother   . Prostate cancer Father   . Hypertension Sister   . Hyperlipidemia Sister   . Emphysema Brother   . Lung cancer Brother   . Dementia Daughter     Past Medical History:  Diagnosis Date  . Arthritis   . Blood transfusion without reported diagnosis   . Hypertension     Patient Active Problem List   Diagnosis Date Noted  . Increased frequency of urination 06/11/2019  . Hallucination 06/11/2019  . Left hip pain 04/03/2019  . Fall 04/03/2019  . Keloid 12/21/2018  . Dementia without behavioral disturbance (HCC) 09/08/2018  . Depression 09/08/2018  . Hypokalemia 07/30/2018  . Chest pain 07/30/2018  . GERD (gastroesophageal reflux disease) 07/30/2018  . Essential hypertension 07/30/2018    Past Surgical History:  Procedure Laterality Date  . ABDOMINAL HYSTERECTOMY    . HEMORROIDECTOMY    . TUBAL LIGATION      Current Outpatient Medications  Medication Sig Dispense Refill  . acetaminophen (TYLENOL) 500 MG tablet Take 1,000 mg by mouth every 6 (six) hours as needed for mild pain or headache.    . citalopram (CELEXA) 10 MG tablet Take 1 tablet (10 mg total) by mouth daily. 90 tablet 1  . diclofenac sodium (VOLTAREN) 1 %  GEL Apply 2 g topically 4 (four) times daily. 1 Tube 2  . donepezil (ARICEPT) 10 MG tablet Take 1 tablet (10 mg total) by mouth at bedtime. 90 tablet 6  . hydrochlorothiazide (HYDRODIURIL) 12.5 MG tablet Take 2 tablets by mouth once daily 180 tablet 0  . losartan (COZAAR) 50 MG tablet Take 1 tablet (50 mg total) by mouth daily. 90 tablet 1  . magic mouthwash w/lidocaine SOLN Take 5 mLs by mouth 3 (three) times daily as needed for mouth pain. 120 mL 0  . omeprazole (PRILOSEC) 40 MG capsule Take 1 capsule (40 mg total) by mouth daily. 90 capsule 1  . QUEtiapine (SEROQUEL) 50 MG tablet Take 1 tablet (50 mg total) by mouth daily. 90 tablet 1  . triamcinolone cream (KENALOG) 0.1 % Apply 1 application  topically 2 (two) times daily. 30 g 0  . memantine (NAMENDA) 10 MG tablet Start with one pill at bedtime. 2 weeks later, if no side effects, increase to one pill twice daily 60 tablet 6   No current facility-administered medications for this visit.    Allergies as of 01/29/2020 - Review Complete 01/29/2020  Allergen Reaction Noted  . Codeine  09/08/2018    Vitals: BP 114/71 (BP Location: Right Arm, Patient Position: Sitting)   Pulse 70   Temp 98.2 F (36.8 C) Comment: daughter 97.4; taken at front  Wt 161 lb (73 kg)   BMI 27.98 kg/m  Last Weight:  Wt Readings from Last 1 Encounters:  01/29/20 161 lb (73 kg)   Last Height:   Ht Readings from Last 1 Encounters:  01/02/20 5' 3.6" (1.615 m)     Physical exam: Exam: Gen: NAD, well nourised, obese, well groomed                     CV: RRR, no MRG. No Carotid Bruits. No peripheral edema, warm, nontender Eyes: Conjunctivae clear without exudates or hemorrhage  Neuro: Detailed Neurologic Exam  Speech:    Speech is normal; fluent and spontaneous with normal comprehension.  Cognition:   MMSE - Mini Mental State Exam 01/29/2020  Orientation to time 0  Orientation to Place 2  Registration 3  Attention/ Calculation 0  Recall 0    Language- name 2 objects 0  Language- repeat 0  Language- follow 3 step command 2  Language- read & follow direction 0  Write a sentence 0  Copy design 0  Total score 7    Cranial Nerves:    The pupils are equal, round, and reactive to light. Could not visualize fundi due to small pupils. Visual fields are full to threat. Extraocular movements are intact. Trigeminal sensation is intact and the muscles of mastication are normal. The face is symmetric. The palate elevates in the midline. Hearing intact. Voice is normal. Shoulder shrug is normal. The tongue has normal motion without fasciculations.   Coordination:    No dysmetria  Gait:    Not parkinsonian, normal stride and stance, walks independently  Motor Observation:    Action tremor. Tone:    Normal muscle tone.  No significant cogwheeling or increased tone.  Posture:    Posture is normal. normal erect    Strength:    Strength is equal and symmetric in the upper and lower limbs.      Sensation: intact to LT     Reflex Exam:  DTR's:    Deep tendon reflexes in the upper and lower extremities are brisk for age bilaterally.   Toes:    The toes are upgoing bilaterally.   Clonus:    Clonus is absent.    Assessment/Plan:  [age] year old patient with advanced dementia. MMSE 7/30. Unclear what kind likely alzheimers. The hallucinations have just started int he last several months (not at onset of memory decline) and she has no parkinsonian features, so I don't think this is Lewy Body. CT Scan showed minimal white matter changes so less likely vascular. At this time medications will not likely make much clinical difference but daughter would like any medication possible prescribed, we will increase Aricept and after that start Namenda. Continue Seroquel for hallucinations.  Orders Placed This Encounter  Procedures  . B12 and Folate Panel  . Homocysteine  . Methylmalonic Acid, Serum  . TSH   Meds  ordered this encounter   Medications  . donepezil (ARICEPT) 10 MG tablet    Sig: Take 1 tablet (10 mg total) by mouth at bedtime.    Dispense:  90 tablet    Refill:  6  . memantine (NAMENDA) 10 MG tablet    Sig: Start with one pill at bedtime. 2 weeks later, if no side effects, increase to one pill twice daily    Dispense:  60 tablet    Refill:  6    Cc: Arnette Felts, FNP,    Naomie Dean, MD  [facility] [address] [place], [place] [address]  Phone [phone] Fax [phone]

## 2020-01-30 ENCOUNTER — Ambulatory Visit: Payer: Medicare Other | Attending: Internal Medicine

## 2020-01-30 DIAGNOSIS — Z23 Encounter for immunization: Secondary | ICD-10-CM

## 2020-01-30 NOTE — Progress Notes (Signed)
   Covid-19 Vaccination Clinic  Name:  Patricia Simmons    MRN: [id number] DOB: 1936/08/20  01/30/2020  [name] was observed post Covid-19 immunization for 15 minutes without incident. She was provided with Vaccine Information Sheet and instruction to access the V-Safe system.   [name] was instructed to call 911 with any severe reactions post vaccine: [name] Kitchen Difficulty breathing  . Swelling of face and throat  . A fast heartbeat  . A bad rash all over body  . Dizziness and weakness   Immunizations Administered    Name Date Dose VIS Date Route   Pfizer COVID-19 Vaccine 01/30/2020  2:12 PM 0.3 mL 10/19/2019 Intramuscular   Manufacturer: [employer], [employer]   Lot: [id number]   NDC: [address]-6

## 2020-01-31 ENCOUNTER — Telehealth: Payer: Self-pay

## 2020-01-31 ENCOUNTER — Other Ambulatory Visit: Payer: Self-pay | Admitting: Nurse Practitioner

## 2020-01-31 DIAGNOSIS — I10 Essential (primary) hypertension: Secondary | ICD-10-CM

## 2020-02-06 ENCOUNTER — Telehealth: Payer: Self-pay | Admitting: *Deleted

## 2020-02-06 LAB — B12 AND FOLATE PANEL
Folate: 9.8 ng/mL (ref >3.0)
Vitamin B-12: 353 pg/mL (ref 232–1245)

## 2020-02-06 LAB — HOMOCYSTEINE: Homocysteine: 16.6 umol/L (ref 0.0–21.3)

## 2020-02-06 LAB — METHYLMALONIC ACID, SERUM: Methylmalonic Acid: 228 nmol/L (ref 0–378)

## 2020-02-06 LAB — TSH

## 2020-02-06 NOTE — Telephone Encounter (Signed)
Pt was informed of results

## 2020-02-06 NOTE — Telephone Encounter (Signed)
Noted thanks °

## 2020-02-06 NOTE — Telephone Encounter (Signed)
-----   Message from Anson Fret, MD sent at 02/06/2020  7:29 AM EDT ----- Labs normal thanks

## 2020-02-06 NOTE — Telephone Encounter (Signed)
Called pt's daughter (main # on chart) and LVM (ok per DPR) for [name] advising pt's labs are normal. I left office number in message for call back if needed.

## 2020-03-03 ENCOUNTER — Other Ambulatory Visit: Payer: Self-pay | Admitting: Nurse Practitioner

## 2020-03-03 DIAGNOSIS — I10 Essential (primary) hypertension: Secondary | ICD-10-CM

## 2020-03-26 ENCOUNTER — Other Ambulatory Visit: Payer: Self-pay | Admitting: Nurse Practitioner

## 2020-03-26 ENCOUNTER — Telehealth: Payer: Self-pay

## 2020-03-26 DIAGNOSIS — E876 Hypokalemia: Secondary | ICD-10-CM

## 2020-03-26 MED ORDER — POTASSIUM CHLORIDE ER 10 MEQ PO TBCR: 10.0000 meq | EXTENDED_RELEASE_TABLET | Freq: Every day | ORAL | 0 refills | Status: DC

## 2020-03-26 NOTE — Progress Notes (Signed)
I will send in 7 days then have her to come for recheck lab

## 2020-04-03 ENCOUNTER — Other Ambulatory Visit: Payer: Medicare Other

## 2020-04-11 ENCOUNTER — Other Ambulatory Visit: Payer: Self-pay | Admitting: Nurse Practitioner

## 2020-04-11 DIAGNOSIS — I10 Essential (primary) hypertension: Secondary | ICD-10-CM

## 2020-04-11 DIAGNOSIS — R443 Hallucinations, unspecified: Secondary | ICD-10-CM

## 2020-04-11 DIAGNOSIS — R350 Frequency of micturition: Secondary | ICD-10-CM

## 2020-05-01 ENCOUNTER — Telehealth: Payer: Self-pay

## 2020-05-05 ENCOUNTER — Telehealth (INDEPENDENT_AMBULATORY_CARE_PROVIDER_SITE_OTHER): Payer: Medicare Other | Admitting: Nurse Practitioner

## 2020-05-05 ENCOUNTER — Telehealth: Payer: Self-pay

## 2020-05-05 ENCOUNTER — Other Ambulatory Visit: Payer: Self-pay

## 2020-05-05 ENCOUNTER — Encounter: Payer: Self-pay | Admitting: Nurse Practitioner

## 2020-05-05 VITALS — BP 124/80 | HR 98

## 2020-05-05 DIAGNOSIS — R059 Cough, unspecified: Secondary | ICD-10-CM

## 2020-05-05 DIAGNOSIS — R05 Cough: Secondary | ICD-10-CM

## 2020-05-05 MED ORDER — IPRATROPIUM BROMIDE 0.03 % NA SOLN: 2.0000 | Freq: Two times a day (BID) | NASAL | 12 refills | Status: DC

## 2020-05-05 NOTE — Progress Notes (Signed)
Virtual Visit via MyChart   This visit type was conducted due to national recommendations for restrictions regarding the COVID-19 Pandemic (e.g. social distancing) in an effort to limit this patient's exposure and mitigate transmission in our community.  Due to her co-morbid illnesses, this patient is at least at moderate risk for complications without adequate follow up.  This format is felt to be most appropriate for this patient at this time.  All issues noted in this document were discussed and addressed.  A limited physical exam was performed with this format.    This visit type was conducted due to national recommendations for restrictions regarding the COVID-19 Pandemic (e.g. social distancing) in an effort to limit this patient's exposure and mitigate transmission in our community.  Patients identity confirmed using two different identifiers.  This format is felt to be most appropriate for this patient at this time.  All issues noted in this document were discussed and addressed.  No physical exam was performed (except for noted visual exam findings with Video Visits).    Date:  05/05/2020   ID:  Patricia Simmons, DOB Aug 21, 1936, MRN [id number]  Patient Location:  Home - spoke with Patricia Simmons and her daughter   Provider location:   Office    Chief Complaint:  Dry cough  History of Present Illness:    Patricia Simmons is a 84 y.o. female who presents via video conferencing for a telehealth visit today.    The patient does have symptoms concerning for COVID-19 infection (fever, chills, cough, or new shortness of breath).   She will wake up coughing.    Cough The current episode started in the past 7 days (since Saturday.). The problem has been unchanged. The cough is non-productive. Pertinent negatives include no chest pain, ear congestion, ear pain or myalgias. The symptoms are aggravated by pollens. Treatments tried: Delsym. There is no history of bronchitis or environmental  allergies.     Past Medical History:  Diagnosis Date  . Arthritis   . Blood transfusion without reported diagnosis   . Hypertension    Past Surgical History:  Procedure Laterality Date  . ABDOMINAL HYSTERECTOMY    . HEMORROIDECTOMY    . TUBAL LIGATION       Current Meds  Medication Sig  . acetaminophen (TYLENOL) 500 MG tablet Take 1,000 mg by mouth every 6 (six) hours as needed for mild pain or headache.  . citalopram (CELEXA) 10 MG tablet Take 1 tablet (10 mg total) by mouth daily.  . diclofenac sodium (VOLTAREN) 1 % GEL Apply 2 g topically 4 (four) times daily.  [name] Kitchen donepezil (ARICEPT) 10 MG tablet Take 1 tablet (10 mg total) by mouth at bedtime.  . hydrochlorothiazide (HYDRODIURIL) 12.5 MG tablet Take 2 tablets by mouth once daily  . losartan (COZAAR) 50 MG tablet Take 1 tablet by mouth once daily  . magic mouthwash w/lidocaine SOLN Take 5 mLs by mouth 3 (three) times daily as needed for mouth pain.  . memantine (NAMENDA) 10 MG tablet Start with one pill at bedtime. 2 weeks later, if no side effects, increase to one pill twice daily  . omeprazole (PRILOSEC) 40 MG capsule Take 1 capsule (40 mg total) by mouth daily.  . potassium chloride (KLOR-CON) 10 MEQ tablet Take 1 tablet (10 mEq total) by mouth daily.  . QUEtiapine (SEROQUEL) 25 MG tablet Take 1 tablet by mouth once daily  . QUEtiapine (SEROQUEL) 50 MG tablet Take 1 tablet (50 mg total) by  mouth daily.  [name] Kitchen triamcinolone cream (KENALOG) 0.1 % Apply 1 application topically 2 (two) times daily.     Allergies:   Codeine   Social History   Tobacco Use  . Smoking status: Never Smoker  . Smokeless tobacco: Never Used  Vaping Use  . Vaping Use: Never used  Substance Use Topics  . Alcohol use: No  . Drug use: No     Family Hx: The patient's family history includes Dementia in her daughter; Emphysema in her brother; Hyperlipidemia in her sister; Hypertension in her mother and sister; Lung cancer in her brother; Prostate  cancer in her father; Stroke in her mother.  ROS:   Please see the history of present illness.    Review of Systems  Constitutional: Negative.   HENT: Negative for ear pain.   Respiratory: Positive for cough.   Cardiovascular: Negative for chest pain, orthopnea and leg swelling.  Musculoskeletal: Negative for myalgias.  Endo/Heme/Allergies: Negative for environmental allergies.  Psychiatric/Behavioral: Negative.     All other systems reviewed and are negative.   Labs/Other Tests and Data Reviewed:    Recent Labs: 01/28/2020: BUN 10; Creatinine, Ser 1.03; Potassium 3.0; Sodium 142 01/29/2020: TSH CANCELED   Recent Lipid Panel No results found for: CHOL, TRIG, HDL, CHOLHDL, LDLCALC, LDLDIRECT  Wt Readings from Last 3 Encounters:  01/29/20 161 lb (73 kg)  01/02/20 157 lb 12.8 oz (71.6 kg)  09/26/19 160 lb (72.6 kg)     Exam:    Vital Signs:  BP 124/80 (BP Location: Left Arm, Patient Position: Sitting, Cuff Size: Small)   Pulse 98     Physical Exam Constitutional:      Appearance: Normal appearance.  Cardiovascular:     Rate and Rhythm: Normal rate and regular rhythm.     Pulses: Normal pulses.     Heart sounds: Normal heart sounds. No murmur heard.   Pulmonary:     Effort: Pulmonary effort is normal. No respiratory distress.  Neurological:     General: No focal deficit present.     Mental Status: She is alert and oriented to person, place, and time.     Cranial Nerves: No cranial nerve deficit.  Psychiatric:        Mood and Affect: Mood normal.        Behavior: Behavior normal.        Thought Content: Thought content normal.        Judgment: Judgment normal.     ASSESSMENT & PLAN:    1. Cough  3 day history of cough no relief with Delsym  Diff Dx - seasonal allergies cough vs bronchitis  Will try ipratropium nasal spray and she is to take mucinex as well and increase water intake - ipratropium (ATROVENT) 0.03 % nasal spray; Place 2 sprays into both  nostrils every 12 (twelve) hours.  Dispense: 30 mL; Refill: 12   COVID-19 Education: The signs and symptoms of COVID-19 were discussed with the patient and how to seek care for testing (follow up with PCP or arrange E-visit).  The importance of social distancing was discussed today.  Patient Risk:   After full review of this patients clinical status, I feel that they are at least moderate risk at this time.  Time:   Today, I have spent 11 minutes/ seconds with the patient with telehealth technology discussing above diagnoses.     Medication Adjustments/Labs and Tests Ordered: Current medicines are reviewed at length with the patient today.  Concerns regarding medicines  are outlined above.   Tests Ordered: No orders of the defined types were placed in this encounter.   Medication Changes: Meds ordered this encounter  Medications  . ipratropium (ATROVENT) 0.03 % nasal spray    Sig: Place 2 sprays into both nostrils every 12 (twelve) hours.    Dispense:  30 mL    Refill:  12    Disposition:  Follow up prn  Signed, Arnette Felts, FNP

## 2020-05-05 NOTE — Patient Instructions (Signed)

## 2020-05-05 NOTE — Telephone Encounter (Signed)
Patient's daughter consented to virtual appointment. YL,RMA

## 2020-05-15 ENCOUNTER — Encounter: Payer: Medicare Other | Admitting: Nurse Practitioner

## 2020-05-15 ENCOUNTER — Telehealth: Payer: Self-pay

## 2020-05-15 ENCOUNTER — Ambulatory Visit: Payer: Medicare Other

## 2020-05-15 NOTE — Telephone Encounter (Signed)
This nurse called patient in order to follow up on missed AWV appointment. Daughter states that she forgot. Rescheduled for 05/29/2020.

## 2020-05-26 ENCOUNTER — Other Ambulatory Visit: Payer: Self-pay | Admitting: Nurse Practitioner

## 2020-05-26 DIAGNOSIS — I10 Essential (primary) hypertension: Secondary | ICD-10-CM

## 2020-05-26 DIAGNOSIS — R443 Hallucinations, unspecified: Secondary | ICD-10-CM

## 2020-05-26 DIAGNOSIS — R350 Frequency of micturition: Secondary | ICD-10-CM

## 2020-05-26 DIAGNOSIS — K219 Gastro-esophageal reflux disease without esophagitis: Secondary | ICD-10-CM

## 2020-05-29 ENCOUNTER — Ambulatory Visit (INDEPENDENT_AMBULATORY_CARE_PROVIDER_SITE_OTHER): Payer: Medicare Other

## 2020-05-29 ENCOUNTER — Other Ambulatory Visit: Payer: Self-pay

## 2020-05-29 ENCOUNTER — Encounter: Payer: Self-pay | Admitting: Nurse Practitioner

## 2020-05-29 ENCOUNTER — Ambulatory Visit (INDEPENDENT_AMBULATORY_CARE_PROVIDER_SITE_OTHER): Payer: Medicare Other | Admitting: Nurse Practitioner

## 2020-05-29 VITALS — BP 126/82 | HR 73 | Temp 98.3°F | Ht 64.0 in | Wt 151.6 lb

## 2020-05-29 VITALS — BP 126/82 | HR 73 | Temp 98.3°F | Ht 64.0 in | Wt 151.0 lb

## 2020-05-29 DIAGNOSIS — L910 Hypertrophic scar: Secondary | ICD-10-CM

## 2020-05-29 DIAGNOSIS — F0391 Unspecified dementia with behavioral disturbance: Secondary | ICD-10-CM

## 2020-05-29 DIAGNOSIS — Z0000 Encounter for general adult medical examination without abnormal findings: Secondary | ICD-10-CM | POA: Diagnosis not present

## 2020-05-29 DIAGNOSIS — Z23 Encounter for immunization: Secondary | ICD-10-CM

## 2020-05-29 DIAGNOSIS — Z79899 Other long term (current) drug therapy: Secondary | ICD-10-CM | POA: Diagnosis not present

## 2020-05-29 DIAGNOSIS — R55 Syncope and collapse: Secondary | ICD-10-CM

## 2020-05-29 MED ORDER — TRIAMCINOLONE ACETONIDE 0.1 % EX CREA: 1.0000 "application " | TOPICAL_CREAM | Freq: Two times a day (BID) | CUTANEOUS | 0 refills | Status: DC

## 2020-05-29 MED ORDER — BOOSTRIX 5-2.5-18.5 LF-MCG/0.5 IM SUSP: 0.5000 mL | Freq: Once | INTRAMUSCULAR | 0 refills | Status: AC

## 2020-05-29 MED ORDER — PREVNAR 13 IM SUSP: 0.5000 mL | INTRAMUSCULAR | 0 refills | Status: AC

## 2020-05-29 NOTE — Progress Notes (Signed)
This visit occurred during the SARS-CoV-2 public health emergency.  Safety protocols were in place, including screening questions prior to the visit, additional usage of staff PPE, and extensive cleaning of exam room while observing appropriate contact time as indicated for disinfecting solutions.  Subjective:   Patricia Simmons is a 84 y.o. female who presents for Medicare Annual (Subsequent) preventive examination.  Review of Systems     Cardiac Risk Factors include: advanced age (>48mn, >>31women);sedentary lifestyle     Objective:    Today's Vitals   05/29/20 1145  BP: 126/82  Pulse: 73  Temp: 98.3 F (36.8 C)  TempSrc: Oral  SpO2: 97%  Weight: 151 lb 9.6 oz (68.8 kg)  Height: '5\' 4"'  (1.626 m)   Body mass index is 26.02 kg/m.  Advanced Directives 05/29/2020 05/15/2019 04/05/2019 01/09/2019 07/30/2018  Does Patient Have a Medical Advance Directive? Yes Yes No No Yes  Type of Advance Directive Healthcare Power of [place] Does patient want to make changes to medical advance directive? - No - Patient declined - - No - Patient declined  Copy of [place]? No - copy requested No - copy requested - - No - copy requested  Would patient like information on creating a medical advance directive? - - - No - Patient declined -    Current Medications (verified) Outpatient Encounter Medications as of 05/29/2020  Medication Sig  . acetaminophen (TYLENOL) 500 MG tablet Take 1,000 mg by mouth every 6 (six) hours as needed for mild pain or headache.  . citalopram (CELEXA) 10 MG tablet Take 1 tablet (10 mg total) by mouth daily.  . diclofenac sodium (VOLTAREN) 1 % GEL Apply 2 g topically 4 (four) times daily.  .[name] Kitchendonepezil (ARICEPT) 10 MG tablet Take 1 tablet (10 mg total) by mouth at bedtime.  . hydrochlorothiazide (HYDRODIURIL) 12.5 MG tablet Take 2 tablets by mouth once daily  . ipratropium (ATROVENT) 0.03  % nasal spray Place 2 sprays into both nostrils every 12 (twelve) hours.  .[name] Kitchenlosartan (COZAAR) 50 MG tablet Take 1 tablet by mouth once daily  . magic mouthwash w/lidocaine SOLN Take 5 mLs by mouth 3 (three) times daily as needed for mouth pain.  . memantine (NAMENDA) 10 MG tablet Start with one pill at bedtime. 2 weeks later, if no side effects, increase to one pill twice daily  . omeprazole (PRILOSEC) 40 MG capsule Take 1 capsule by mouth once daily  . potassium chloride (KLOR-CON) 10 MEQ tablet Take 1 tablet (10 mEq total) by mouth daily.  . QUEtiapine (SEROQUEL) 25 MG tablet Take 1 tablet by mouth once daily  . QUEtiapine (SEROQUEL) 50 MG tablet Take 1 tablet (50 mg total) by mouth daily.  .[name] Kitchentriamcinolone cream (KENALOG) 0.1 % Apply 1 application topically 2 (two) times daily.  . pneumococcal 13-valent conjugate vaccine (PREVNAR 13) SUSP injection Inject 0.5 mLs into the muscle tomorrow at 10 am for 1 dose.  . Tdap (BOOSTRIX) 5-2.5-18.5 LF-MCG/0.5 injection Inject 0.5 mLs into the muscle once for 1 dose.   No facility-administered encounter medications on file as of 05/29/2020.    Allergies (verified) Codeine   History: Past Medical History:  Diagnosis Date  . Arthritis   . Blood transfusion without reported diagnosis   . Hypertension    Past Surgical History:  Procedure Laterality Date  . ABDOMINAL HYSTERECTOMY    . HEMORROIDECTOMY    . TUBAL  LIGATION     Family History  Problem Relation Age of Onset  . Stroke Mother   . Hypertension Mother   . Prostate cancer Father   . Hypertension Sister   . Hyperlipidemia Sister   . Emphysema Brother   . Lung cancer Brother   . Dementia Daughter    Social History   Socioeconomic History  . Marital status: Single    Spouse name: Not on file  . Number of children: 5  . Years of education: unknown  . Highest education level: Not on file  Occupational History  . Occupation: retired  Tobacco Use  . Smoking status: Never Smoker    . Smokeless tobacco: Never Used  Vaping Use  . Vaping Use: Never used  Substance and Sexual Activity  . Alcohol use: No  . Drug use: No  . Sexual activity: Not Currently  Other Topics Concern  . Not on file  Social History Narrative   Lives with her daughter [name]   Right handed   Social Determinants of Health   Financial Resource Strain: Low Risk   . Difficulty of Paying Living Expenses: Not hard at all  Food Insecurity: No Food Insecurity  . Worried About Charity fundraiser in the Last Year: Never true  . Ran Out of Food in the Last Year: Never true  Transportation Needs: No Transportation Needs  . Lack of Transportation (Medical): No  . Lack of Transportation (Non-Medical): No  Physical Activity: Inactive  . Days of Exercise per Week: 0 days  . Minutes of Exercise per Session: 0 min  Stress: No Stress Concern Present  . Feeling of Stress : Not at all  Social Connections:   . Frequency of Communication with Friends and Family:   . Frequency of Social Gatherings with Friends and Family:   . Attends Religious Services:   . Active Member of Clubs or Organizations:   . Attends Archivist Meetings:   [name] Kitchen Marital Status:     Tobacco Counseling Counseling given: Not Answered   Clinical Intake:  Pre-visit preparation completed: Yes  Pain : No/denies pain     Nutritional Status: BMI 25 -29 Overweight Nutritional Risks: Nausea/ vomitting/ diarrhea (vomit during an episode yesterday) Diabetes: No  How often do you need to have someone help you when you read instructions, pamphlets, or other written materials from your doctor or pharmacy?: 1 - Never  Diabetic? no  Interpreter Needed?: No  Information entered by :: NAllen LPN   Activities of Daily Living In your present state of health, do you have any difficulty performing the following activities: 05/29/2020  Hearing? N  Vision? N  Difficulty concentrating or making decisions? Y  Comment has  dementia  Walking or climbing stairs? N  Dressing or bathing? N  Doing errands, shopping? Y  Comment has daughter with her  Preparing Food and eating ? Y  Using the Toilet? N  In the past six months, have you accidently leaked urine? N  Do you have problems with loss of bowel control? N  Managing your Medications? Y  Comment daughter manages  Managing your Finances? Y  Comment daughter Engineer, production or managing your Housekeeping? Y  Comment daughter manages  Some recent data might be hidden    Patient Care Team: [name], FNP as PCP - General ([place]) Rex Kras, Claudette Stapler, RN as Case Manager Daneen Schick as Social Worker  Indicate any recent Medical Services you may have received from other  than [facility] providers in the past year (date may be approximate).     Assessment:   This is a routine wellness examination for Chalise.  Hearing/Vision screen  Hearing Screening   '125Hz'  '250Hz'  '500Hz'  '1000Hz'  '2000Hz'  '3000Hz'  '4000Hz'  '6000Hz'  '8000Hz'   Right ear:           Left ear:           Vision Screening Comments: Regular eye exams, Dr. Katy Fitch  Dietary issues and exercise activities discussed:    Goals    . I am not sure if mother is taking Seroquel     Daughter [name] stated Current Barriers:  [name] Kitchen Knowledge Deficits related to Medication Management and familiarly of patient's prescribed medication regimen . Chronic Disease Management support and education needs related to Dementia  Nurse Case Manager Clinical Goal(s):  [name] Kitchen Over the next 30 days, patient's daughter [name] will work with the CCM team to address needs related to increasing knowledge and understanding of patients Medication regimen including the indication, dosage and frequency of each medication - 10/23/19 Goal Unknown - unable to reach patient's daughter for update  . 10/23/19- Over the next 60 days, patient's legal guardian who is also her daughter, Fardowsa Authier will complete a comprehensive medication review with  this RN to assess for knowledge and understanding of patients medication regimen, including indication, dosage and frequency  CCM RN CM Interventions:  10/23/19 Case Collaboration with embedded BSW Kendra Humble   . Evaluation of current medication regimen and patient's adherence to plan as established by provider  . Per collaboration with embedded BSW Daneen Schick patient's daughter Kayliee Atienza now has legal guardianship over her mother's care . Reviewed 09/26/19 OV with provider [name], FNP, patient is taking Seroquel as prescribed . Reviewed a medication dosage increase per [name], FNP due to daughter [name] reports ongoing hallucinations   Currently UNABLE TO independently perform self care  Please see past updates related to this goal by clicking on the "Past Updates" button in the selected goal      . I haven't noticed any Hallucinations     Daughter [name] stated Current Barriers:  [name] Kitchen Knowledge Deficits related to Dementia disease process and knowing what to expect with disease progression  . Cognitive Deficits  Nurse Case Manager Clinical Goal(s):  [name] Kitchen Over the next 30 days, patient will work with PCP provider to address needs related to Neurology referral for evaluation and treatment of Dementia  Goal Met . 10/22/2019: Over the next 30 days the patient will attend her scheduled neurology appointment to address ongoing changes to cognition hallucinations.  CCM SW Interventions Completed 10/22/2019 with [name] . Outbound call placed to [name] to review progression of patient goal . Discussed plans to follow up with Dr Jaynee Eagles on January 14 for initial evaluation . Collaboration with RN Case Manager to update on outcome of neurology referral  Patient Self Care Activities:  . Currently UNABLE TO independently perform Self Care  Please see past updates related to this goal by clicking on the "Past Updates" button in the selected goal      . Patient Stated      05/29/2020, no goals    . To acheive a BP within target range of 130/80 or less than     Current Barriers:  [name] Kitchen Knowledge Deficits related to disease process and Self Health Management of HTN . Cognitive Deficits . Chronic Disease Management support and education needs related to Hypertension   Nurse Case Manager  Clinical Goal(s):  [name] Kitchen Over the next 90 days, patient will verbalize basic understanding of Hypertension disease process and self health management plan as evidenced by patient's caregiver, Jakiah Bienaime will be able to monitor patient's BP at home and will understand when to call the CCM team and or PCP to report abnormal BP readings  Interventions:  . Evaluation of current treatment plan related to Hypertension as established by provider . Reviewed medications as prescribed by the provider and noted patient is prescribed to take Hydrochlorothiazide and Losartan for treatment management of Hypertension . Provided patient's daughter [name] with printed educational materials related to "What is High Blood Pressure", African-Americans and High Blood Pressure", Why I need to Restrict Sodium",  "Life's Simple 7" . CCM RN CM plan to review patient educational materials at next scheduled follow up call   Patient Self Care Activities:   . Unable to independently perform Self Care  Initial goal documentation     . To maintain serum Potassium level within normal range per MD recommendation     Current Barriers:  [name] Kitchen Knowledge Deficits related to treatment management of Hypokalemia . Cognitive Deficits . Chronic Disease Management support and education needs related to Hypokalemia  Nurse Case Manager Clinical Goal(s):  [name] Kitchen Over the next 60 days, patient will work with the CCM team and PCP to address needs related to Hypokalemia  Interventions:  . Evaluation of current treatment plan related to Hypokalemia as established by provider . Reviewed medications and noted patient is currently not  prescribed to take a K+ supplement and is prescribed to take HCTZ for BP control . Sent secure message to provider [name], FNP making her aware . Provided patient with printed educational materials related to Potassium Rich Foods  Patient Self Care Activities:  . Unable to independently perform self care  Initial goal documentation       Depression Screen PHQ 2/9 Scores 05/29/2020 05/05/2020 01/02/2020 09/26/2019 07/18/2019 05/15/2019 05/15/2019  PHQ - 2 Score 0 0 0 0 '3 3 5  ' PHQ- 9 Score - 0 0 - '10 15 14    ' Fall Risk Fall Risk  05/29/2020 01/02/2020 09/26/2019 07/18/2019 05/15/2019  Falls in the past year? 0 0 0 0 0  Number falls in past yr: - - - - -  Injury with Fall? - - - - -  Risk for fall due to : Medication side effect - - - Medication side effect  Follow up Falls evaluation completed;Education provided;Falls prevention discussed - - - Falls evaluation completed;Falls prevention discussed    Any stairs in or around the home? Yes  If so, are there any without handrails? Yes  Home free of loose throw rugs in walkways, pet beds, electrical cords, etc? Yes  Adequate lighting in your home to reduce risk of falls? Yes   ASSISTIVE DEVICES UTILIZED TO PREVENT FALLS:  Life alert? No  Use of a cane, walker or w/c? Yes  Grab bars in the bathroom? Yes  Shower chair or bench in shower? Yes  Elevated toilet seat or a handicapped toilet? Yes   TIMED UP AND GO:  Was the test performed? No .   Gait slow and steady with assistive device  Cognitive Function: MMSE - Mini Mental State Exam 01/29/2020  Orientation to time 0  Orientation to Place 2  Registration 3  Attention/ Calculation 0  Recall 0  Language- name 2 objects 0  Language- repeat 0  Language- follow 3 step command 2  Language- read &  follow direction 0  Write a sentence 0  Copy design 0  Total score 7        Immunizations Immunization History  Administered Date(s) Administered  . Influenza, High Dose Seasonal PF  07/18/2019  . Influenza-Unspecified 08/07/2018  . PFIZER SARS-COV-2 Vaccination 01/04/2020, 01/30/2020    TDAP status: Sent to pharmacy Flu Vaccine status: Up to date Pneumococcal vaccine status: Sent to pharmacy Covid-19 vaccine status: Completed vaccines  Qualifies for Shingles Vaccine? Yes   Zostavax completed No   Shingrix Completed?: No.    Education has been provided regarding the importance of this vaccine. Patient has been advised to call insurance company to determine out of pocket expense if they have not yet received this vaccine. Advised may also receive vaccine at local pharmacy or Health Dept. Verbalized acceptance and understanding.  Screening Tests Health Maintenance  Topic Date Due  . TETANUS/TDAP  Never done  . DEXA SCAN  Never done  . PNA vac Low Risk Adult (1 of 2 - PCV13) Never done  . INFLUENZA VACCINE  06/08/2020  . COVID-19 Vaccine  Completed    Health Maintenance  Health Maintenance Due  Topic Date Due  . TETANUS/TDAP  Never done  . DEXA SCAN  Never done  . PNA vac Low Risk Adult (1 of 2 - PCV13) Never done    Colorectal cancer screening: No longer required.  Mammogram status: No longer required.  Bone Density status: Scheduled  Lung Cancer Screening: (Low Dose CT Chest recommended if Age 1-80 years, 30 pack-year currently smoking OR have quit w/in 15years.) does not qualify.   Lung Cancer Screening Referral: no  Additional Screening:  Hepatitis C Screening: does not qualify;  Vision Screening: Recommended annual ophthalmology exams for early detection of glaucoma and other disorders of the eye. Is the patient up to date with their annual eye exam?  Yes  Who is the provider or what is the name of the office in which the patient attends annual eye exams? Dr. Katy Fitch If pt is not established with a provider, would they like to be referred to a provider to establish care? No .   Dental Screening: Recommended annual dental exams for proper oral  hygiene  Community Resource Referral / Chronic Care Management: CRR required this visit?  No   CCM required this visit?  No      Plan:     I have personally reviewed and noted the following in the patient's chart:   . Medical and social history . Use of alcohol, tobacco or illicit drugs  . Current medications and supplements . Functional ability and status . Nutritional status . Physical activity . Advanced directives . List of other physicians . Hospitalizations, surgeries, and ER visits in previous 12 months . Vitals . Screenings to include cognitive, depression, and falls . Referrals and appointments  In addition, I have reviewed and discussed with patient certain preventive protocols, quality metrics, and best practice recommendations. A written personalized care plan for preventive services as well as general preventive health recommendations were provided to patient.     Kellie Simmering, LPN   [id number]   Nurse Notes: 6 CIT no performed. Patient has diagnosis of dementia.

## 2020-05-29 NOTE — Patient Instructions (Signed)
Patricia Simmons , Thank you for taking time to come for your Medicare Wellness Visit. I appreciate your ongoing commitment to your health goals. Please review the following plan we discussed and let me know if I can assist you in the future.   Screening recommendations/referrals: Colonoscopy: not requied Mammogram: not required Bone Density: scheduled for August Recommended yearly ophthalmology/optometry visit for glaucoma screening and checkup Recommended yearly dental visit for hygiene and checkup  Vaccinations: Influenza vaccine: completed 07/18/2019, due 06/08/2020 Pneumococcal vaccine: sent to pharmacy Tdap vaccine: sent to pharmacy Shingles vaccine: discussed   Covid-19:01/30/2020, 01/04/2020  Advanced directives: Please bring a copy of your POA (Power of [place]) and/or Living Will to your next appointment.   Conditions/risks identified: none  Next appointment: Follow up in one year for your annual wellness visit    Preventive Care 65 Years and Older, Female Preventive care refers to lifestyle choices and visits with your health care provider that can promote health and wellness. What does preventive care include?  A yearly physical exam. This is also called an annual well check.  Dental exams once or twice a year.  Routine eye exams. Ask your health care provider how often you should have your eyes checked.  Personal lifestyle choices, including:  Daily care of your teeth and gums.  Regular physical activity.  Eating a healthy diet.  Avoiding tobacco and drug use.  Limiting alcohol use.  Practicing safe sex.  Taking low-dose aspirin every day.  Taking vitamin and mineral supplements as recommended by your health care provider. What happens during an annual well check? The services and screenings done by your health care provider during your annual well check will depend on your age, overall health, lifestyle risk factors, and family history of disease. Counseling    Your health care provider may ask you questions about your:  Alcohol use.  Tobacco use.  Drug use.  Emotional well-being.  Home and relationship well-being.  Sexual activity.  Eating habits.  History of falls.  Memory and ability to understand (cognition).  Work and work Astronomer.  Reproductive health. Screening  You may have the following tests or measurements:  Height, weight, and BMI.  Blood pressure.  Lipid and cholesterol levels. These may be checked every 5 years, or more frequently if you are over 34 years old.  Skin check.  Lung cancer screening. You may have this screening every year starting at age 89 if you have a 30-pack-year history of smoking and currently smoke or have quit within the past 15 years.  Fecal occult blood test (FOBT) of the stool. You may have this test every year starting at age 87.  Flexible sigmoidoscopy or colonoscopy. You may have a sigmoidoscopy every 5 years or a colonoscopy every 10 years starting at age 8.  Hepatitis C blood test.  Hepatitis B blood test.  Sexually transmitted disease (STD) testing.  Diabetes screening. This is done by checking your blood sugar (glucose) after you have not eaten for a while (fasting). You may have this done every 1-3 years.  Bone density scan. This is done to screen for osteoporosis. You may have this done starting at age 8.  Mammogram. This may be done every 1-2 years. Talk to your health care provider about how often you should have regular mammograms. Talk with your health care provider about your test results, treatment options, and if necessary, the need for more tests. Vaccines  Your health care provider may recommend certain vaccines, such as:  Influenza  vaccine. This is recommended every year.  Tetanus, diphtheria, and acellular pertussis (Tdap, Td) vaccine. You may need a Td booster every 10 years.  Zoster vaccine. You may need this after age 53.  Pneumococcal 13-valent  conjugate (PCV13) vaccine. One dose is recommended after age 8.  Pneumococcal polysaccharide (PPSV23) vaccine. One dose is recommended after age 48. Talk to your health care provider about which screenings and vaccines you need and how often you need them. This information is not intended to replace advice given to you by your health care provider. Make sure you discuss any questions you have with your health care provider. Document Released: 11/21/2015 Document Revised: 07/14/2016 Document Reviewed: 08/26/2015 Elsevier Interactive Patient Education  2017 [place] Prevention in the Home Falls can cause injuries. They can happen to people of all ages. There are many things you can do to make your home safe and to help prevent falls. What can I do on the outside of my home?  Regularly fix the edges of walkways and driveways and fix any cracks.  Remove anything that might make you trip as you walk through a door, such as a raised step or threshold.  Trim any bushes or trees on the path to your home.  Use bright outdoor lighting.  Clear any walking paths of anything that might make someone trip, such as rocks or tools.  Regularly check to see if handrails are loose or broken. Make sure that both sides of any steps have handrails.  Any raised decks and porches should have guardrails on the edges.  Have any leaves, snow, or ice cleared regularly.  Use sand or salt on walking paths during winter.  Clean up any spills in your garage right away. This includes oil or grease spills. What can I do in the bathroom?  Use night lights.  Install grab bars by the toilet and in the tub and shower. Do not use towel bars as grab bars.  Use non-skid mats or decals in the tub or shower.  If you need to sit down in the shower, use a plastic, non-slip stool.  Keep the floor dry. Clean up any water that spills on the floor as soon as it happens.  Remove soap buildup in the tub or  shower regularly.  Attach bath mats securely with double-sided non-slip rug tape.  Do not have throw rugs and other things on the floor that can make you trip. What can I do in the bedroom?  Use night lights.  Make sure that you have a light by your bed that is easy to reach.  Do not use any sheets or blankets that are too big for your bed. They should not hang down onto the floor.  Have a firm chair that has side arms. You can use this for support while you get dressed.  Do not have throw rugs and other things on the floor that can make you trip. What can I do in the kitchen?  Clean up any spills right away.  Avoid walking on wet floors.  Keep items that you use a lot in easy-to-reach places.  If you need to reach something above you, use a strong step stool that has a grab bar.  Keep electrical cords out of the way.  Do not use floor polish or wax that makes floors slippery. If you must use wax, use non-skid floor wax.  Do not have throw rugs and other things on the floor that can  make you trip. What can I do with my stairs?  Do not leave any items on the stairs.  Make sure that there are handrails on both sides of the stairs and use them. Fix handrails that are broken or loose. Make sure that handrails are as long as the stairways.  Check any carpeting to make sure that it is firmly attached to the stairs. Fix any carpet that is loose or worn.  Avoid having throw rugs at the top or bottom of the stairs. If you do have throw rugs, attach them to the floor with carpet tape.  Make sure that you have a light switch at the top of the stairs and the bottom of the stairs. If you do not have them, ask someone to add them for you. What else can I do to help prevent falls?  Wear shoes that:  Do not have high heels.  Have rubber bottoms.  Are comfortable and fit you well.  Are closed at the toe. Do not wear sandals.  If you use a stepladder:  Make sure that it is fully  opened. Do not climb a closed stepladder.  Make sure that both sides of the stepladder are locked into place.  Ask someone to hold it for you, if possible.  Clearly mark and make sure that you can see:  Any grab bars or handrails.  First and last steps.  Where the edge of each step is.  Use tools that help you move around (mobility aids) if they are needed. These include:  Canes.  Walkers.  Scooters.  Crutches.  Turn on the lights when you go into a dark area. Replace any light bulbs as soon as they burn out.  Set up your furniture so you have a clear path. Avoid moving your furniture around.  If any of your floors are uneven, fix them.  If there are any pets around you, be aware of where they are.  Review your medicines with your doctor. Some medicines can make you feel dizzy. This can increase your chance of falling. Ask your doctor what other things that you can do to help prevent falls. This information is not intended to replace advice given to you by your health care provider. Make sure you discuss any questions you have with your health care provider. Document Released: 08/21/2009 Document Revised: 04/01/2016 Document Reviewed: 11/29/2014 Elsevier Interactive Patient Education  2017 [employer].

## 2020-05-29 NOTE — Patient Instructions (Signed)
Get probiotic over the counter

## 2020-05-29 NOTE — Progress Notes (Signed)
This visit occurred during the SARS-CoV-2 public health emergency.  Safety protocols were in place, including screening questions prior to the visit, additional usage of staff PPE, and extensive cleaning of exam room while observing appropriate contact time as indicated for disinfecting solutions.   Subjective:     Patient ID: Patricia Simmons , female    DOB: 07/08/1936 , 84 y.o.   MRN: [id number]   Chief Complaint  Patient presents with  . Loss of Consciousness    HPI  She is here today with her daughter after having an episode yesterday at the nail shop yesterday, after getting her feet done her head slump to the side and saliva was coming out of her mouth she was unresponsive for approximately 6 seconds according to her daughter. After her daughter called her name 3-4 times she finally responded.  The patient did not want to leave, she wanted to finish getting her nails done and stated she was feeling ok. Pt was fine the rest of the day, she went to another family member house for a gathering later that evening. Her daughter stated she didn't eat her breakfast yesterday before all of this happen and that was unusual for her.      Past Medical History:  Diagnosis Date  . Arthritis   . Blood transfusion without reported diagnosis   . Hypertension      Family History  Problem Relation Age of Onset  . Stroke Mother   . Hypertension Mother   . Prostate cancer Father   . Hypertension Sister   . Hyperlipidemia Sister   . Emphysema Brother   . Lung cancer Brother   . Dementia Daughter      Current Outpatient Medications:  .  acetaminophen (TYLENOL) 500 MG tablet, Take 1,000 mg by mouth every 6 (six) hours as needed for mild pain or headache., Disp: , Rfl:  .  citalopram (CELEXA) 10 MG tablet, Take 1 tablet (10 mg total) by mouth daily., Disp: 90 tablet, Rfl: 1 .  diclofenac sodium (VOLTAREN) 1 % GEL, Apply 2 g topically 4 (four) times daily., Disp: 1 Tube, Rfl: 2 .  donepezil  (ARICEPT) 10 MG tablet, Take 1 tablet (10 mg total) by mouth at bedtime., Disp: 90 tablet, Rfl: 6 .  hydrochlorothiazide (HYDRODIURIL) 12.5 MG tablet, Take 2 tablets by mouth once daily, Disp: 180 tablet, Rfl: 0 .  ipratropium (ATROVENT) 0.03 % nasal spray, Place 2 sprays into both nostrils every 12 (twelve) hours., Disp: 30 mL, Rfl: 12 .  losartan (COZAAR) 50 MG tablet, Take 1 tablet by mouth once daily, Disp: 90 tablet, Rfl: 0 .  magic mouthwash w/lidocaine SOLN, Take 5 mLs by mouth 3 (three) times daily as needed for mouth pain., Disp: 120 mL, Rfl: 0 .  memantine (NAMENDA) 10 MG tablet, Start with one pill at bedtime. 2 weeks later, if no side effects, increase to one pill twice daily, Disp: 60 tablet, Rfl: 6 .  omeprazole (PRILOSEC) 40 MG capsule, Take 1 capsule by mouth once daily, Disp: 90 capsule, Rfl: 0 .  pneumococcal 13-valent conjugate vaccine (PREVNAR 13) SUSP injection, Inject 0.5 mLs into the muscle tomorrow at 10 am for 1 dose., Disp: 0.5 mL, Rfl: 0 .  potassium chloride (KLOR-CON) 10 MEQ tablet, Take 1 tablet (10 mEq total) by mouth daily., Disp: 30 tablet, Rfl: 0 .  QUEtiapine (SEROQUEL) 25 MG tablet, Take 1 tablet by mouth once daily, Disp: 30 tablet, Rfl: 0 .  QUEtiapine (SEROQUEL) 50 MG  tablet, Take 1 tablet (50 mg total) by mouth daily., Disp: 90 tablet, Rfl: 1 .  Tdap (BOOSTRIX) 5-2.5-18.5 LF-MCG/0.5 injection, Inject 0.5 mLs into the muscle once for 1 dose., Disp: 0.5 mL, Rfl: 0 .  triamcinolone cream (KENALOG) 0.1 %, Apply 1 application topically 2 (two) times daily., Disp: 30 g, Rfl: 0   Allergies  Allergen Reactions  . Codeine     unknown     Review of Systems  Constitutional: Negative.   HENT: Negative.   Respiratory: Negative.   Cardiovascular: Negative.   Gastrointestinal: Negative.   Skin: Negative.   Neurological: Positive for syncope.  Psychiatric/Behavioral: Negative.      Today's Vitals   05/29/20 1444  BP: 126/82  Pulse: 73  Temp: 98.3 F (36.8  C)  TempSrc: Oral  SpO2: 97%  Weight: 151 lb (68.5 kg)  Height: _0  (1.626 m)   Body mass index is 25.92 kg/m.   Objective:  Physical Exam Constitutional:      Appearance: Normal appearance.  Cardiovascular:     Rate and Rhythm: Normal rate and regular rhythm.  Pulmonary:     Effort: Pulmonary effort is normal.     Breath sounds: Normal breath sounds.  Musculoskeletal:        General: Normal range of motion.  Skin:    General: Skin is warm and dry.  Neurological:     Mental Status: She is alert.     Coordination: Finger-Nose-Finger Test abnormal.     Comments: Oriented to self and place unable to follow some simple commands          Assessment And Plan:     1. Syncope, unspecified syncope type  Will check CT scan due to having more difficulty with coordination finger to nose test, may be related to her impaired memory   She does not have any abnormal weakness with strength but is having difficulty with understanding directions  Advised to take ASA 81 mg daily - CMP14+EGFR - CBC no Diff - TSH - CT Head Wo Contrast; Future  2. Keloid - triamcinolone cream (KENALOG) 0.1 %; Apply 1 application topically 2 (two) times daily.  Dispense: 30 g; Refill: 0  3. Dementia with behavioral disturbance, unspecified dementia type [facility])  She is currently taking Namenda given by Neurology, she is to follow up in Sept    Patient was given opportunity to ask questions. Patient verbalized understanding of the plan and was able to repeat key elements of the plan. All questions were answered to their satisfaction.  [name], FNP   I personally spent 30 minutes face-to-face and non-face-to-face in the care of this patient, which includes all pre-, intra-, and post visit time on the date of service.  Teola Bradley, FNP, have reviewed all documentation for this visit. The documentation on 05/29/20 for the exam, diagnosis, procedures, and orders are all accurate and complete.   THE PATIENT IS ENCOURAGED TO PRACTICE SOCIAL DISTANCING DUE TO THE COVID-19 PANDEMIC.

## 2020-05-30 LAB — CBC
Hematocrit: 37.3 % (ref 34.0–46.6)
Hemoglobin: 12 g/dL (ref 11.1–15.9)
MCH: 29.6 pg (ref 26.6–33.0)
MCHC: 32.2 g/dL (ref 31.5–35.7)
MCV: 92 fL (ref 79–97)
Platelets: 206 10*3/uL (ref 150–450)
RBC: 4.06 x10E6/uL (ref 3.77–5.28)
RDW: 12.5 % (ref 11.7–15.4)
WBC: 4.1 10*3/uL (ref 3.4–10.8)

## 2020-05-30 LAB — CMP14+EGFR
ALT: 6 IU/L (ref 0–32)
AST: 13 IU/L (ref 0–40)
Albumin/Globulin Ratio: 1.8 (ref 1.2–2.2)
Albumin: 4.2 g/dL (ref 3.6–4.6)
Alkaline Phosphatase: 68 IU/L (ref 48–121)
BUN/Creatinine Ratio: 13 (ref 12–28)
BUN: 15 mg/dL (ref 8–27)
Bilirubin Total: 0.5 mg/dL (ref 0.0–1.2)
CO2: 27 mmol/L (ref 20–29)
Calcium: 9.6 mg/dL (ref 8.7–10.3)
Chloride: 97 mmol/L (ref 96–106)
Creatinine, Ser: 1.16 mg/dL — ABNORMAL HIGH (ref 0.57–1.00)
GFR calc Af Amer: 51 mL/min/{1.73_m2} — ABNORMAL LOW (ref >59)
GFR calc non Af Amer: 44 mL/min/{1.73_m2} — ABNORMAL LOW (ref >59)
Globulin, Total: 2.3 g/dL (ref 1.5–4.5)
Glucose: 122 mg/dL — ABNORMAL HIGH (ref 65–99)
Potassium: 3.1 mmol/L — ABNORMAL LOW (ref 3.5–5.2)
Sodium: 140 mmol/L (ref 134–144)
Total Protein: 6.5 g/dL (ref 6.0–8.5)

## 2020-05-30 LAB — TSH: TSH: 1.45 u[IU]/mL (ref 0.450–4.500)

## 2020-06-11 ENCOUNTER — Other Ambulatory Visit: Payer: Self-pay

## 2020-06-11 ENCOUNTER — Ambulatory Visit
Admission: RE | Admit: 2020-06-11 | Discharge: 2020-06-11 | Disposition: A | Discharge status: Home / Self Care | Payer: Medicare Other | Source: Ambulatory Visit | Attending: Nurse Practitioner | Admitting: Nurse Practitioner

## 2020-06-11 DIAGNOSIS — I6782 Cerebral ischemia: Secondary | ICD-10-CM | POA: Diagnosis not present

## 2020-06-11 DIAGNOSIS — G319 Degenerative disease of nervous system, unspecified: Secondary | ICD-10-CM | POA: Diagnosis not present

## 2020-06-11 DIAGNOSIS — R55 Syncope and collapse: Secondary | ICD-10-CM

## 2020-06-13 ENCOUNTER — Telehealth: Payer: Self-pay

## 2020-06-13 NOTE — Telephone Encounter (Signed)
  Chronic Care Management   Outreach Note  06/13/2020 Name: Patricia Simmons MRN: [id number] DOB: [birth year]  Referred by: Arnette Felts, FNP Reason for referral : No chief complaint on file.   A second unsuccessful telephone outreach was attempted today. The patient was referred to the case management team for assistance with care management and care coordination.   Follow Up Plan: A HIPPA compliant phone message was left for the patient providing contact information and requesting a return call.  Telephone follow up appointment with care management team member scheduled for: 07/22/20  Delsa Sale, RN, BSN, CCM Care Management Coordinator [facility] Care Management/[facility]  Direct Phone: [phone]

## 2020-06-20 ENCOUNTER — Other Ambulatory Visit: Payer: Medicare Other

## 2020-06-24 ENCOUNTER — Other Ambulatory Visit: Payer: Self-pay

## 2020-06-24 DIAGNOSIS — L910 Hypertrophic scar: Secondary | ICD-10-CM

## 2020-06-24 DIAGNOSIS — R443 Hallucinations, unspecified: Secondary | ICD-10-CM

## 2020-06-24 DIAGNOSIS — R350 Frequency of micturition: Secondary | ICD-10-CM

## 2020-06-24 MED ORDER — TRIAMCINOLONE ACETONIDE 0.1 % EX CREA: 1.0000 "application " | TOPICAL_CREAM | Freq: Two times a day (BID) | CUTANEOUS | 0 refills | Status: DC

## 2020-06-24 MED ORDER — QUETIAPINE FUMARATE 25 MG PO TABS: 25.0000 mg | ORAL_TABLET | Freq: Every day | ORAL | 2 refills | Status: DC

## 2020-07-10 ENCOUNTER — Other Ambulatory Visit: Payer: Self-pay | Admitting: Nurse Practitioner

## 2020-07-10 DIAGNOSIS — F32A Depression, unspecified: Secondary | ICD-10-CM

## 2020-07-22 ENCOUNTER — Telehealth: Payer: Self-pay

## 2020-07-24 ENCOUNTER — Ambulatory Visit: Payer: Medicare Other | Admitting: Nurse Practitioner

## 2020-07-29 ENCOUNTER — Ambulatory Visit (INDEPENDENT_AMBULATORY_CARE_PROVIDER_SITE_OTHER): Payer: Medicare Other | Admitting: Nurse Practitioner

## 2020-07-29 ENCOUNTER — Encounter: Payer: Self-pay | Admitting: Nurse Practitioner

## 2020-07-29 ENCOUNTER — Other Ambulatory Visit: Payer: Self-pay

## 2020-07-29 VITALS — BP 120/70 | HR 66 | Temp 98.5°F | Ht 64.0 in | Wt 149.4 lb

## 2020-07-29 DIAGNOSIS — F329 Major depressive disorder, single episode, unspecified: Secondary | ICD-10-CM

## 2020-07-29 DIAGNOSIS — R1030 Lower abdominal pain, unspecified: Secondary | ICD-10-CM | POA: Diagnosis not present

## 2020-07-29 DIAGNOSIS — F32A Depression, unspecified: Secondary | ICD-10-CM

## 2020-07-29 DIAGNOSIS — F0391 Unspecified dementia with behavioral disturbance: Secondary | ICD-10-CM

## 2020-07-29 DIAGNOSIS — Z23 Encounter for immunization: Secondary | ICD-10-CM | POA: Diagnosis not present

## 2020-07-29 DIAGNOSIS — R55 Syncope and collapse: Secondary | ICD-10-CM | POA: Diagnosis not present

## 2020-07-29 MED ORDER — QUETIAPINE FUMARATE 25 MG PO TABS: ORAL_TABLET | ORAL | 1 refills | Status: DC

## 2020-07-29 MED ORDER — CITALOPRAM HYDROBROMIDE 10 MG PO TABS: 10.0000 mg | ORAL_TABLET | Freq: Every day | ORAL | 1 refills | Status: DC

## 2020-07-29 NOTE — Progress Notes (Signed)
I,Yamilka Roman [employer] as a Neurosurgeon for [employer], FNP.,have documented all relevant documentation on the behalf of Arnette Felts, FNP,as directed by  Arnette Felts, FNP while in the presence of Arnette Felts, FNP.  This visit occurred during the SARS-CoV-2 public health emergency.  Safety protocols were in place, including screening questions prior to the visit, additional usage of staff PPE, and extensive cleaning of exam room while observing appropriate contact time as indicated for disinfecting solutions.  Subjective:     Patient ID: Patricia Simmons , female    DOB: [birth year] , 84 y.o.   MRN: [id number]   Chief Complaint  Patient presents with  . syncope f/u    patient presents today for a f/u. patient's daughter stated she is doing much better since the last time she was here. she does think that the pt is having a problem with her leg or hip    HPI  She is doing well since her last visit.  She is complaining about her stomach hurting after eating she will grab her lower abdomen. She will have episodes of loose bowels.  She is taking the omeprazole daily  Her daughter is having surgery on 9/28 she will not be able to get her to the doctor until the middle of October.  Her daughter "Coy Saunas" who had been taking care of [name] has now been diagnosed with Dementia and is living with her daughter.     Past Medical History:  Diagnosis Date  . Arthritis   . Blood transfusion without reported diagnosis   . Hypertension      Family History  Problem Relation Age of Onset  . Stroke Mother   . Hypertension Mother   . Prostate cancer Father   . Hypertension Sister   . Hyperlipidemia Sister   . Emphysema Brother   . Lung cancer Brother   . Dementia Daughter      Current Outpatient Medications:  .  acetaminophen (TYLENOL) 500 MG tablet, Take 1,000 mg by mouth every 6 (six) hours as needed for mild pain or headache., Disp: , Rfl:  .  citalopram (CELEXA) 10 MG tablet, Take  1 tablet (10 mg total) by mouth daily., Disp: 90 tablet, Rfl: 1 .  diclofenac sodium (VOLTAREN) 1 % GEL, Apply 2 g topically 4 (four) times daily., Disp: 1 Tube, Rfl: 2 .  donepezil (ARICEPT) 10 MG tablet, Take 1 tablet (10 mg total) by mouth at bedtime., Disp: 90 tablet, Rfl: 6 .  hydrochlorothiazide (HYDRODIURIL) 12.5 MG tablet, Take 2 tablets by mouth once daily, Disp: 180 tablet, Rfl: 0 .  ipratropium (ATROVENT) 0.03 % nasal spray, Place 2 sprays into both nostrils every 12 (twelve) hours., Disp: 30 mL, Rfl: 12 .  losartan (COZAAR) 50 MG tablet, Take 1 tablet by mouth once daily, Disp: 90 tablet, Rfl: 0 .  magic mouthwash w/lidocaine SOLN, Take 5 mLs by mouth 3 (three) times daily as needed for mouth pain., Disp: 120 mL, Rfl: 0 .  memantine (NAMENDA) 10 MG tablet, Start with one pill at bedtime. 2 weeks later, if no side effects, increase to one pill twice daily, Disp: 60 tablet, Rfl: 6 .  omeprazole (PRILOSEC) 40 MG capsule, Take 1 capsule by mouth once daily, Disp: 90 capsule, Rfl: 0 .  potassium chloride (KLOR-CON) 10 MEQ tablet, Take 1 tablet (10 mEq total) by mouth daily., Disp: 30 tablet, Rfl: 0 .  triamcinolone cream (KENALOG) 0.1 %, Apply 1 application topically 2 (two) times daily., Disp:  30 g, Rfl: 0 .  QUEtiapine (SEROQUEL) 25 MG tablet, Take 1 tab by mouth in am and 2 tabs by mouth at bedtime, Disp: 180 tablet, Rfl: 1   Allergies  Allergen Reactions  . Codeine     unknown     Review of Systems  Constitutional: Negative.   Respiratory: Negative.   Cardiovascular: Negative.  Negative for chest pain, palpitations and leg swelling.  Musculoskeletal: Positive for arthralgias (left hip pain - chronic).       She is using a rollator walker  Neurological: Negative for dizziness and headaches.  Psychiatric/Behavioral: Negative.      Today's Vitals   07/29/20 0938  BP: 120/70  Pulse: 66  Temp: 98.5 F (36.9 C)  Weight: 149 lb 6.4 oz (67.8 kg)  Height: 5\' 4"  (1.626 m)   PainSc: 0-No pain   Body mass index is 25.64 kg/m.   Objective:  Physical Exam Constitutional:      General: She is not in acute distress. Cardiovascular:     Rate and Rhythm: Normal rate and regular rhythm.     Pulses: Normal pulses.     Heart sounds: Normal heart sounds. No murmur heard.   Pulmonary:     Effort: Pulmonary effort is normal. No respiratory distress.     Breath sounds: Normal breath sounds.  Neurological:     General: No focal deficit present.     Mental Status: She is alert and oriented to person, place, and time.     Cranial Nerves: No cranial nerve deficit.  Psychiatric:        Attention and Perception: She is inattentive.        Mood and Affect: Affect is flat.        Cognition and Memory: Cognition is impaired. Memory is impaired.        Judgment: Judgment is not impulsive.         Assessment And Plan:     1. Syncope, unspecified syncope type  She is doing better and has not had any episodes  2. Depression, unspecified depression type  Chronic, continue with citalopram as directed - citalopram (CELEXA) 10 MG tablet; Take 1 tablet (10 mg total) by mouth daily.  Dispense: 90 tablet; Refill: 1  3. Lower abdominal pain   She does not hold her stomach or complain about her stomach during her visit however since this is long standing I will refer her to GI for further evaluation - Ambulatory referral to Gastroenterology  4. Dementia with behavioral disturbance, unspecified dementia type (HCC)  Stable, continues to have intermittent hallucinations but not as bad  She is doing well with seroquel (clarified taking 25 mg am and 50 mg pm to help her sleep.) - QUEtiapine (SEROQUEL) 25 MG tablet; Take 1 tab by mouth in am and 2 tabs by mouth at bedtime  Dispense: 180 tablet; Refill: 1  5. Need for influenza vaccination  Influenza vaccine administered  Encouraged to take Tylenol as needed for fever or muscle aches. - Flu Vaccine QUAD High Dose(Flu  ad)     Patient was given opportunity to ask questions. Patient verbalized understanding of the plan and was able to repeat key elements of the plan. All questions were answered to their satisfaction.    , FNP, have reviewed all documentation for this visit. The documentation on 07/29/20 for the exam, diagnosis, procedures, and orders are all accurate and complete.  THE PATIENT IS ENCOURAGED TO PRACTICE SOCIAL DISTANCING DUE TO THE  COVID-19 PANDEMIC.

## 2020-07-30 ENCOUNTER — Ambulatory Visit: Payer: Self-pay

## 2020-07-30 ENCOUNTER — Telehealth: Payer: Medicare Other

## 2020-07-30 DIAGNOSIS — E876 Hypokalemia: Secondary | ICD-10-CM

## 2020-07-30 DIAGNOSIS — R1030 Lower abdominal pain, unspecified: Secondary | ICD-10-CM

## 2020-07-30 DIAGNOSIS — F0391 Unspecified dementia with behavioral disturbance: Secondary | ICD-10-CM

## 2020-07-30 DIAGNOSIS — I10 Essential (primary) hypertension: Secondary | ICD-10-CM

## 2020-07-30 DIAGNOSIS — G3184 Mild cognitive impairment, so stated: Secondary | ICD-10-CM

## 2020-08-04 ENCOUNTER — Other Ambulatory Visit: Payer: Self-pay

## 2020-08-04 ENCOUNTER — Ambulatory Visit: Payer: Self-pay

## 2020-08-04 ENCOUNTER — Telehealth: Payer: Medicare Other

## 2020-08-04 DIAGNOSIS — R1030 Lower abdominal pain, unspecified: Secondary | ICD-10-CM

## 2020-08-04 DIAGNOSIS — G3184 Mild cognitive impairment, so stated: Secondary | ICD-10-CM

## 2020-08-04 DIAGNOSIS — E876 Hypokalemia: Secondary | ICD-10-CM

## 2020-08-04 DIAGNOSIS — F0391 Unspecified dementia with behavioral disturbance: Secondary | ICD-10-CM

## 2020-08-04 DIAGNOSIS — I10 Essential (primary) hypertension: Secondary | ICD-10-CM

## 2020-08-04 MED ORDER — POTASSIUM CHLORIDE ER 10 MEQ PO TBCR: 10.0000 meq | EXTENDED_RELEASE_TABLET | Freq: Every day | ORAL | 1 refills | Status: DC

## 2020-08-04 NOTE — Chronic Care Management (AMB) (Signed)
Chronic Care Management   Follow Up Note   08/04/2020 Name: Patricia Simmons MRN: [id number] DOB: 02-Sep-1936  Referred by: [name], FNP Reason for referral : Chronic Care Management (FU RN CM Call )   COREAN YOSHIMURA is a 84 y.o. year old female who is a primary care patient of [name], [place]. The CCM team was consulted for assistance with chronic disease management and care coordination needs.    Review of patient status, including review of consultants reports, relevant laboratory and other test results, and collaboration with appropriate care team members and the patient's provider was performed as part of comprehensive patient evaluation and provision of chronic care management services.    SDOH (Social Determinants of Health) assessments performed: No See Care Plan activities for detailed interventions related to [place])   Placed outbound CCM RN CM call to daughter [name].     Outpatient Encounter Medications as of 08/04/2020  Medication Sig  . acetaminophen (TYLENOL) 500 MG tablet Take 1,000 mg by mouth every 6 (six) hours as needed for mild pain or headache.  . citalopram (CELEXA) 10 MG tablet Take 1 tablet (10 mg total) by mouth daily.  . diclofenac sodium (VOLTAREN) 1 % GEL Apply 2 g topically 4 (four) times daily.  [name] Kitchen donepezil (ARICEPT) 10 MG tablet Take 1 tablet (10 mg total) by mouth at bedtime.  . hydrochlorothiazide (HYDRODIURIL) 12.5 MG tablet Take 2 tablets by mouth once daily  . ipratropium (ATROVENT) 0.03 % nasal spray Place 2 sprays into both nostrils every 12 (twelve) hours.  [name] Kitchen losartan (COZAAR) 50 MG tablet Take 1 tablet by mouth once daily  . magic mouthwash w/lidocaine SOLN Take 5 mLs by mouth 3 (three) times daily as needed for mouth pain.  . memantine (NAMENDA) 10 MG tablet Start with one pill at bedtime. 2 weeks later, if no side effects, increase to one pill twice daily  . omeprazole (PRILOSEC) 40 MG capsule Take 1 capsule by mouth once daily  .  potassium chloride (KLOR-CON) 10 MEQ tablet Take 1 tablet (10 mEq total) by mouth daily.  . QUEtiapine (SEROQUEL) 25 MG tablet Take 1 tab by mouth in am and 2 tabs by mouth at bedtime  . triamcinolone cream (KENALOG) 0.1 % Apply 1 application topically 2 (two) times daily.   No facility-administered encounter medications on file as of 08/04/2020.     Objective:  No results found for: HGBA1C Lab Results  Component Value Date   CREATININE 1.16 (H) 05/29/2020   BP Readings from Last 3 Encounters:  07/29/20 120/70  05/29/20 126/82  05/29/20 126/82    Goals Addressed    . To maintain serum Potassium level within normal range per MD recommendation       Current Barriers:  [name] Kitchen Knowledge Deficits related to treatment management of Hypokalemia . Cognitive Deficits . Chronic Disease Management support and education needs related to Hypokalemia  Nurse Case Manager Clinical Goal(s):  [name] Kitchen Over the next 60 days, patient will work with the CCM team and PCP to address needs related to Hypokalemia  Goal Met  . New 08/04/20 Over the next 30 days, patient will have her K+ blood level rechecked at PCP office for evaluation and treatment of hypokalemia . New 07/30/20 Over the next 180 days, patient and caregiver will work with the CCM team and PCP for disease education and support for improved Self Health management of hypokalemia   CCM RN CM Interventions:  07/30/20 call completed with patient's daughter Janann Colonel  [place]  . Evaluation of current treatment plan related to Hypokalemia as established by provider . Reviewed and discussed PCP prescribed Potassium Chloride 10 meq, take 1 tablet qd for support of hypokalemia . Determined last K+ level was checked on 05/29/20 and was noted to be around 3.1 . Re-educated daughter regarding Potassium rich foods; Provided daughter with printed educational materials to be mailed related to Potassium Rich Foods . Collaborated with PCP [name], FNP regarding  patient's last K+ of 3.1 checked in July and next OV scheduled is set for January 2022 . Discussed plans with patient for ongoing care management follow up and provided patient with direct contact information for care management team 08/04/20 Call completed with daughter Lynnix Schoneman . Collaboration with PCP regarding the need to have patient come into the office for a K+ level recheck . Placed outbound call to daughter [name] to advise patient will need her Potassium blood level rechecked  . Discussed the order will be placed and [name] can call the office to schedule a lab visit following her foot surgery scheduled for tomorrow  . Determined patient will need a new Rx sent to the pharmacy for a refill of Potassium Chloride 10 meq . Sent in basket message to PCP [name], FNP requesting a refill for this medication  . Discussed plans with patient for ongoing care management follow up and provided patient with direct contact information for care management team  Patient Self Care Activities:  . Unable to independently perform self care  Please see past updates related to this goal by clicking on the "Past Updates" button in the selected goal        Plan:   Telephone follow up appointment with care management team member scheduled for: 08/13/20  [name], RN, BSN, CCM Care Management Coordinator [facility]  Direct Phone: [phone]

## 2020-08-04 NOTE — Patient Instructions (Signed)
Visit Information  Goals Addressed    . To maintain serum Potassium level within normal range per MD recommendation       Current Barriers:  [name] Kitchen Knowledge Deficits related to treatment management of Hypokalemia . Cognitive Deficits . Chronic Disease Management support and education needs related to Hypokalemia  Nurse Case Manager Clinical Goal(s):  [name] Kitchen Over the next 60 days, patient will work with the CCM team and PCP to address needs related to Hypokalemia  Goal Met  . New 08/04/20 Over the next 30 days, patient will have her K+ blood level rechecked at PCP office for evaluation and treatment of hypokalemia . New 07/30/20 Over the next 180 days, patient and caregiver will work with the CCM team and PCP for disease education and support for improved Self Health management of hypokalemia   CCM RN CM Interventions:  07/30/20 call completed with patient's daughter Quetzally Callas  . Evaluation of current treatment plan related to Hypokalemia as established by provider . Reviewed and discussed PCP prescribed Potassium Chloride 10 meq, take 1 tablet qd for support of hypokalemia . Determined last K+ level was checked on 05/29/20 and was noted to be around 3.1 . Re-educated daughter regarding Potassium rich foods; Provided daughter with printed educational materials to be mailed related to Potassium Rich Foods . Collaborated with PCP [name], FNP regarding patient's last K+ of 3.1 checked in July and next OV scheduled is set for January 2022 . Discussed plans with patient for ongoing care management follow up and provided patient with direct contact information for care management team 08/04/20 Call completed with daughter Deseray Daponte . Collaboration with PCP regarding the need to have patient come into the office for a K+ level recheck . Placed outbound call to daughter [name] to advise patient will need her Potassium blood level rechecked  . Discussed the order will be placed and [name] can call the  office to schedule a lab visit following her foot surgery scheduled for tomorrow  . Determined patient will need a new Rx sent to the pharmacy for a refill of Potassium Chloride 10 meq . Sent in basket message to PCP [name], FNP requesting a refill for this medication  . Discussed plans with patient for ongoing care management follow up and provided patient with direct contact information for care management team  Patient Self Care Activities:  . Unable to independently perform self care  Please see past updates related to this goal by clicking on the "Past Updates" button in the selected goal        Patient verbalizes understanding of instructions provided today.   Telephone follow up appointment with care management team member scheduled for: 08/13/20  [name], RN, BSN, CCM Care Management Coordinator [place] [facility]  Direct Phone: [phone]

## 2020-08-04 NOTE — Chronic Care Management (AMB) (Signed)
Chronic Care Management   Follow Up Note   07/30/2020 Name: MAYELI BORNHORST MRN: [id number] DOB: [birth year]  Referred by: [name], FNP Reason for referral : Chronic Care Management (FU RN CM Call )   JERLYN PAIN is a 84 y.o. year old female who is a primary care patient of [name], [place]. The CCM team was consulted for assistance with chronic disease management and care coordination needs.    Review of patient status, including review of consultants reports, relevant laboratory and other test results, and collaboration with appropriate care team members and the patient's provider was performed as part of comprehensive patient evaluation and provision of chronic care management services.    SDOH (Social Determinants of Health) assessments performed: Yes - no acute challenges identified at this time  See Care Plan activities for detailed interventions related to [facility])   Placed outbound follow up call to daughter Jamiesha Victoria for a CCM RN CM for a care plan update.     Outpatient Encounter Medications as of 07/30/2020  Medication Sig  . acetaminophen (TYLENOL) 500 MG tablet Take 1,000 mg by mouth every 6 (six) hours as needed for mild pain or headache.  . citalopram (CELEXA) 10 MG tablet Take 1 tablet (10 mg total) by mouth daily.  . diclofenac sodium (VOLTAREN) 1 % GEL Apply 2 g topically 4 (four) times daily.  [name] Kitchen donepezil (ARICEPT) 10 MG tablet Take 1 tablet (10 mg total) by mouth at bedtime.  . hydrochlorothiazide (HYDRODIURIL) 12.5 MG tablet Take 2 tablets by mouth once daily  . ipratropium (ATROVENT) 0.03 % nasal spray Place 2 sprays into both nostrils every 12 (twelve) hours.  [name] Kitchen losartan (COZAAR) 50 MG tablet Take 1 tablet by mouth once daily  . magic mouthwash w/lidocaine SOLN Take 5 mLs by mouth 3 (three) times daily as needed for mouth pain.  . memantine (NAMENDA) 10 MG tablet Start with one pill at bedtime. 2 weeks later, if no side effects, increase to one pill twice  daily  . omeprazole (PRILOSEC) 40 MG capsule Take 1 capsule by mouth once daily  . potassium chloride (KLOR-CON) 10 MEQ tablet Take 1 tablet (10 mEq total) by mouth daily.  . QUEtiapine (SEROQUEL) 25 MG tablet Take 1 tab by mouth in am and 2 tabs by mouth at bedtime  . triamcinolone cream (KENALOG) 0.1 % Apply 1 application topically 2 (two) times daily.   No facility-administered encounter medications on file as of 07/30/2020.     Objective:  No results found for: HGBA1C Lab Results  Component Value Date   CREATININE 1.16 (H) 05/29/2020   BP Readings from Last 3 Encounters:  07/29/20 120/70  05/29/20 126/82  05/29/20 126/82    Goals Addressed    . "to have her stomach pain checked out"       Daughter stated  [place] (see longitudinal plan of care for additional care plan information)  Current Barriers:  [name] Kitchen Knowledge Deficits related to evaluation and treatment of acute GI pain  . Chronic Disease Management support and education needs related to HTN, Dementia, Hypokalemia . Cognitive Deficits  Nurse Case Manager Clinical Goal(s):  [name] Kitchen Over the next 45 days, patient will work with the GI Specialist  to address needs related to evaluation and treatment of acute GI pain   CCM RN CM Interventions:  07/30/20 call completed with daughter [name]  . Inter-disciplinary care team collaboration (see longitudinal plan of care) . Evaluation of current treatment plan related  to acute GI pain and patient's adherence to plan as established by provider . Determined patient is experiencing lower abdominal GI pain after meals, daughter denies bowel incontinence . Determined PCP sent referral to GI Specialist, Dr. Collene Mares, provided daughter with name/contact information for GI Specialist  . Discussed plans with patient for ongoing care management follow up and provided patient with direct contact information for care management team  Patient Self Care Activities:  . Supportive daughter to  meet care needs . Unable to independently perform Self Care   Initial goal documentation     . "to keep her dementia under good control"       CARE PLAN ENTRY (see longitudinal plan of care for additional care plan information)  Current Barriers:  [name] Kitchen Knowledge Deficits related to disease process and Self Health management of Dementia . Chronic Disease Management support and education needs related to HTN, Dementia, Hypokalemia . Cognitive Deficits  Nurse Case Manager Clinical Goal(s):  [name] Kitchen Over the next 180 days, patient will work with the CCM team and PCP to address needs related to disease education and support for improved Self Health management of Dementia   CCM RN CM Interventions:  07/30/20 call completed with daughter Carnisha Feltz  . Inter-disciplinary care team collaboration (see longitudinal plan of care) . Evaluation of current treatment plan related to Dementia and patient's adherence to plan as established by provider. . Provided education to patient re: disease process and stages of Dementia, including what to expect and when to call the doctor for changes in patients condition when needed  . Reviewed medications with patient and discussed daughter is administering patient's medications and patient is cooperative with taking meds; Current regimen as prescribed for Dementia include; Aricept 10 mg q hs, Namenda 10 mg bid, patient is taking Seroquel 25 mg q am and 50 mg prn at bedtime  . Discussed plans with patient for ongoing care management follow up and provided patient with direct contact information for care management team . Provided patient with printed educational materials related to What is Dementia?  Patient Self Care Activities:  . Supportive daughter to assist with care needs . Unable to independently perform Self Care  Initial goal documentation     . COMPLETED: I am not sure if mother is taking Seroquel       Daughter [name] stated Current Barriers:   [name] Kitchen Knowledge Deficits related to Medication Management and familiarly of patient's prescribed medication regimen . Chronic Disease Management support and education needs related to Dementia  Nurse Case Manager Clinical Goal(s):  [name] Kitchen Over the next 30 days, patient's daughter [name] will work with the CCM team to address needs related to increasing knowledge and understanding of patients Medication regimen including the indication, dosage and frequency of each medication - 10/23/19 Goal Unknown - unable to reach patient's daughter for update 07/30/20 Goal Met  . 10/23/19- Over the next 60 days, patient's legal guardian who is also her daughter, Shemekia Patane will complete a comprehensive medication review with this RN to assess for knowledge and understanding of patients medication regimen, including indication, dosage and frequency Goal Met   CCM RN CM Interventions:  07/30/20 call completed with patient's daughter Findley Blankenbaker . Evaluation of current medication regimen and patient's adherence to plan as established by provider  . Reviewed medications with daughter and determined patient is taking Seroquel exactly as prescribed with good effectiveness and w/o SE; Current regimen:  o Seroquel 25 mg q am, 50 mg prn q hs  Currently UNABLE TO independently perform self care  Please see past updates related to this goal by clicking on the "Past Updates" button in the selected goal      . COMPLETED: I haven't noticed any Hallucinations       Daughter [name] stated Current Barriers:  [name] Kitchen Knowledge Deficits related to Dementia disease process and knowing what to expect with disease progression  . Cognitive Deficits  Nurse Case Manager Clinical Goal(s):  [name] Kitchen Over the next 30 days, patient will work with PCP provider to address needs related to Neurology referral for evaluation and treatment of Dementia  Goal Met . 10/22/2019: Over the next 30 days the patient will attend her scheduled neurology appointment to  address ongoing changes to cognition hallucinations. Goal Met   CCM RN CM Interventions 07/30/20 call completed with daughter Kiani Wurtzel . Inter-disciplinary care team collaboration (see longitudinal plan of care) . 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 . Provided education to patient re: disease process and treatment recommendations for hallucinations  . Reviewed medications with patient and discussed daughter is administering the prescribed medications to patient for Dementia: Aricept 10 mg q hs, Namenda 10 mg bid  . Discussed plans with patient for ongoing care management follow up and provided patient with direct contact information for care management team  Patient Self Care Activities:  . Currently UNABLE TO independently perform Self Care  Please see past updates related to this goal by clicking on the "Past Updates" button in the selected goal      . COMPLETED: To acheive a BP within target range of 130/80 or less than       Current Barriers:  [name] Kitchen Knowledge Deficits related to disease process and Self Health Management of HTN . Cognitive Deficits . Chronic Disease Management support and education needs related to Hypertension   Nurse Case Manager Clinical Goal(s):  [name] Kitchen Over the next 90 days, patient will verbalize basic understanding of Hypertension disease process and self health management plan as evidenced by patient's caregiver, Anasofia Micallef will be able to monitor patient's BP at home and will understand when to call the CCM team and or PCP to report abnormal BP readings Goal Met   CCM RN CM Interventions:  07/30/20 call completed with daughter Breshay Ilg  . Evaluation of current treatment plan related to Hypertension as established by provider . Reviewed medications as prescribed by the provider and noted patient is prescribed to take losartan (COZAAR) 50 MG tablet, Take 1  tablet by mouth once daily, daughter is administering, denies SE or missed doses . Determined daughter is checking BP for patient at home and reports her BP is at target range equal to or <130/80 . Confirmed daughter Janann Colonel received and reviewed previous printed educational materials related to "What is High Blood Pressure", African-Americans and High Blood Pressure", Why I need to Restrict Sodium",  "Life's Simple 7" . Discussed plans with patient for ongoing care management follow up and provided patient with direct contact information for care management team  Patient Self Care Activities:   . Unable to independently perform Self Care  Please see past updates related to this goal by clicking on the "Past Updates" button in the selected goal      . To maintain serum Potassium level within normal range per MD recommendation       Current Barriers:  [name] Kitchen Knowledge Deficits related to treatment management of Hypokalemia .  Cognitive Deficits . Chronic Disease Management support and education needs related to Hypokalemia  Nurse Case Manager Clinical Goal(s):  [name] Kitchen Over the next 60 days, patient will work with the CCM team and PCP to address needs related to Hypokalemia  Goal Met  . New 07/30/20 Over the next 180 days, patient and caregiver will work with the CCM team and PCP for disease education and support for improved Self Health management of hypokalemia   CCM RN CM Interventions:  07/30/20 call completed with patient's daughter Aleera Gilcrease  . Evaluation of current treatment plan related to Hypokalemia as established by provider . Reviewed and discussed PCP prescribed Potassium Chloride 10 meq, take 1 tablet qd for support of hypokalemia . Determined last K+ level was checked on 05/29/20 and was noted to be around 3.1 . Re-educated daughter regarding Potassium rich foods; Provided daughter with printed educational materials to be mailed related to Potassium Rich Foods . Collaborated with PCP  [name], FNP regarding patient's last K+ of 3.1 checked in July and next OV scheduled is set for January 2022 . Discussed plans with patient for ongoing care management follow up and provided patient with direct contact information for care management team  Patient Self Care Activities:  . Unable to independently perform self care  Please see past updates related to this goal by clicking on the "Past Updates" button in the selected goal         Plan:   Telephone follow up appointment with care management team member scheduled for: 08/13/20   [name], RN, BSN, CCM Care Management Coordinator [place] [facility]  Direct Phone: [phone]

## 2020-08-04 NOTE — Patient Instructions (Signed)
Visit Information  Goals Addressed    . "to have her stomach pain checked out"       Daughter stated  [place] (see longitudinal plan of care for additional care plan information)  Current Barriers:  [name] Kitchen Knowledge Deficits related to evaluation and treatment of acute GI pain  . Chronic Disease Management support and education needs related to HTN, Dementia, Hypokalemia . Cognitive Deficits  Nurse Case Manager Clinical Goal(s):  [name] Kitchen Over the next 45 days, patient will work with the GI Specialist  to address needs related to evaluation and treatment of acute GI pain   CCM RN CM Interventions:  07/30/20 call completed with daughter [name]  . Inter-disciplinary care team collaboration (see longitudinal plan of care) . Evaluation of current treatment plan related to acute GI pain and patient's adherence to plan as established by provider . Determined patient is experiencing lower abdominal GI pain after meals, daughter denies bowel incontinence . Determined PCP sent referral to GI Specialist, Dr. Collene Mares, provided daughter with name/contact information for GI Specialist  . Discussed plans with patient for ongoing care management follow up and provided patient with direct contact information for care management team  Patient Self Care Activities:  . Supportive daughter to meet care needs . Unable to independently perform Self Care   Initial goal documentation     . "to keep her dementia under good control"       CARE PLAN ENTRY (see longitudinal plan of care for additional care plan information)  Current Barriers:  [name] Kitchen Knowledge Deficits related to disease process and Self Health management of Dementia . Chronic Disease Management support and education needs related to HTN, Dementia, Hypokalemia . Cognitive Deficits  Nurse Case Manager Clinical Goal(s):  [name] Kitchen Over the next 180 days, patient will work with the CCM team and PCP to address needs related to disease education and support for  improved Self Health management of Dementia   CCM RN CM Interventions:  07/30/20 call completed with daughter Maudie Shingledecker  . Inter-disciplinary care team collaboration (see longitudinal plan of care) . Evaluation of current treatment plan related to Dementia and patient's adherence to plan as established by provider. . Provided education to patient re: disease process and stages of Dementia, including what to expect and when to call the doctor for changes in patients condition when needed  . Reviewed medications with patient and discussed daughter is administering patient's medications and patient is cooperative with taking meds; Current regimen as prescribed for Dementia include; Aricept 10 mg q hs, Namenda 10 mg bid, patient is taking Seroquel 25 mg q am and 50 mg prn at bedtime  . Discussed plans with patient for ongoing care management follow up and provided patient with direct contact information for care management team . Provided patient with printed educational materials related to What is Dementia?  Patient Self Care Activities:  . Supportive daughter to assist with care needs . Unable to independently perform Self Care  Initial goal documentation     . COMPLETED: I am not sure if mother is taking Seroquel       Daughter [name] stated Current Barriers:  [name] Kitchen Knowledge Deficits related to Medication Management and familiarly of patient's prescribed medication regimen . Chronic Disease Management support and education needs related to Dementia  Nurse Case Manager Clinical Goal(s):  [name] Kitchen Over the next 30 days, patient's daughter [name] will work with the CCM team to address needs related to increasing knowledge and understanding of patients Medication  regimen including the indication, dosage and frequency of each medication - 10/23/19 Goal Unknown - unable to reach patient's daughter for update 07/30/20 Goal Met  . 10/23/19- Over the next 60 days, patient's legal guardian who is also her  daughter, Lakeyia Surber will complete a comprehensive medication review with this RN to assess for knowledge and understanding of patients medication regimen, including indication, dosage and frequency Goal Met   CCM RN CM Interventions:  07/30/20 call completed with patient's daughter Aizley Stenseth . Evaluation of current medication regimen and patient's adherence to plan as established by provider  . Reviewed medications with daughter and determined patient is taking Seroquel exactly as prescribed with good effectiveness and w/o SE; Current regimen:  o Seroquel 25 mg q am, 50 mg prn q hs   Currently UNABLE TO independently perform self care  Please see past updates related to this goal by clicking on the "Past Updates" button in the selected goal      . COMPLETED: I haven't noticed any Hallucinations       Daughter [name] stated Current Barriers:  [name] Kitchen Knowledge Deficits related to Dementia disease process and knowing what to expect with disease progression  . Cognitive Deficits  Nurse Case Manager Clinical Goal(s):  [name] Kitchen Over the next 30 days, patient will work with PCP provider to address needs related to Neurology referral for evaluation and treatment of Dementia  Goal Met . 10/22/2019: Over the next 30 days the patient will attend her scheduled neurology appointment to address ongoing changes to cognition hallucinations. Goal Met   CCM RN CM Interventions 07/30/20 call completed with daughter Morissa Obeirne . Inter-disciplinary care team collaboration (see longitudinal plan of care) . 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 . Provided education to patient re: disease process and treatment recommendations for hallucinations  . Reviewed medications with patient and discussed daughter is administering the prescribed medications to patient for Dementia: Aricept 10 mg q hs, Namenda 10 mg bid   . Discussed plans with patient for ongoing care management follow up and provided patient with direct contact information for care management team  Patient Self Care Activities:  . Currently UNABLE TO independently perform Self Care  Please see past updates related to this goal by clicking on the "Past Updates" button in the selected goal      . COMPLETED: To acheive a BP within target range of 130/80 or less than       Current Barriers:  [name] Kitchen Knowledge Deficits related to disease process and Self Health Management of HTN . Cognitive Deficits . Chronic Disease Management support and education needs related to Hypertension   Nurse Case Manager Clinical Goal(s):  [name] Kitchen Over the next 90 days, patient will verbalize basic understanding of Hypertension disease process and self health management plan as evidenced by patient's caregiver, Margean Korell will be able to monitor patient's BP at home and will understand when to call the CCM team and or PCP to report abnormal BP readings Goal Met   CCM RN CM Interventions:  07/30/20 call completed with daughter Katori Wirsing  . Evaluation of current treatment plan related to Hypertension as established by provider . Reviewed medications as prescribed by the provider and noted patient is prescribed to take losartan (COZAAR) 50 MG tablet, Take 1 tablet by mouth once daily, daughter is administering, denies SE or missed doses . Determined daughter is checking BP for patient at home and  reports her BP is at target range equal to or <130/80 . Confirmed daughter Janann Colonel received and reviewed previous printed educational materials related to "What is High Blood Pressure", African-Americans and High Blood Pressure", Why I need to Restrict Sodium",  "Life's Simple 7" . Discussed plans with patient for ongoing care management follow up and provided patient with direct contact information for care management team  Patient Self Care Activities:   . Unable to independently  perform Self Care  Please see past updates related to this goal by clicking on the "Past Updates" button in the selected goal      . To maintain serum Potassium level within normal range per MD recommendation       Current Barriers:  [name] Kitchen Knowledge Deficits related to treatment management of Hypokalemia . Cognitive Deficits . Chronic Disease Management support and education needs related to Hypokalemia  Nurse Case Manager Clinical Goal(s):  [name] Kitchen Over the next 60 days, patient will work with the CCM team and PCP to address needs related to Hypokalemia  Goal Met  . New 07/30/20 Over the next 180 days, patient and caregiver will work with the CCM team and PCP for disease education and support for improved Self Health management of hypokalemia   CCM RN CM Interventions:  07/30/20 call completed with patient's daughter Zemira Zehring  . Evaluation of current treatment plan related to Hypokalemia as established by provider . Reviewed and discussed PCP prescribed Potassium Chloride 10 meq, take 1 tablet qd for support of hypokalemia . Determined last K+ level was checked on 05/29/20 and was noted to be around 3.1 . Re-educated daughter regarding Potassium rich foods; Provided daughter with printed educational materials to be mailed related to Potassium Rich Foods . Collaborated with PCP [name], FNP regarding patient's last K+ of 3.1 checked in July and next OV scheduled is set for January 2022 . Discussed plans with patient for ongoing care management follow up and provided patient with direct contact information for care management team  Patient Self Care Activities:  . Unable to independently perform self care  Please see past updates related to this goal by clicking on the "Past Updates" button in the selected goal        Patient verbalizes understanding of instructions provided today.   Telephone follow up appointment with care management team member scheduled for: 08/13/20  [name], RN, BSN, CCM Care Management Coordinator [place] [facility]  Direct Phone: [phone]

## 2020-08-06 ENCOUNTER — Ambulatory Visit: Payer: Medicare Other | Admitting: Adult Health

## 2020-08-13 ENCOUNTER — Telehealth: Payer: Medicare Other

## 2020-08-18 ENCOUNTER — Other Ambulatory Visit: Payer: Medicare Other

## 2020-08-18 ENCOUNTER — Other Ambulatory Visit: Payer: Self-pay | Admitting: Nurse Practitioner

## 2020-08-18 ENCOUNTER — Other Ambulatory Visit: Payer: Self-pay

## 2020-08-18 DIAGNOSIS — E876 Hypokalemia: Secondary | ICD-10-CM | POA: Diagnosis not present

## 2020-08-19 LAB — BMP8+EGFR
BUN/Creatinine Ratio: 11 — ABNORMAL LOW (ref 12–28)
BUN: 10 mg/dL (ref 8–27)
CO2: 28 mmol/L (ref 20–29)
Calcium: 10 mg/dL (ref 8.7–10.3)
Chloride: 94 mmol/L — ABNORMAL LOW (ref 96–106)
Creatinine, Ser: 0.94 mg/dL (ref 0.57–1.00)
GFR calc Af Amer: 65 mL/min/{1.73_m2} (ref >59)
GFR calc non Af Amer: 57 mL/min/{1.73_m2} — ABNORMAL LOW (ref >59)
Glucose: 85 mg/dL (ref 65–99)
Potassium: 3 mmol/L — ABNORMAL LOW (ref 3.5–5.2)
Sodium: 137 mmol/L (ref 134–144)

## 2020-08-22 ENCOUNTER — Telehealth: Payer: Medicare Other

## 2020-08-25 ENCOUNTER — Other Ambulatory Visit: Payer: Self-pay | Admitting: Nurse Practitioner

## 2020-08-25 DIAGNOSIS — I10 Essential (primary) hypertension: Secondary | ICD-10-CM

## 2020-08-25 DIAGNOSIS — E876 Hypokalemia: Secondary | ICD-10-CM

## 2020-08-25 MED ORDER — HYDROCHLOROTHIAZIDE 12.5 MG PO TABS: 12.5000 mg | ORAL_TABLET | Freq: Every day | ORAL | 1 refills | Status: DC

## 2020-08-25 MED ORDER — POTASSIUM CHLORIDE ER 20 MEQ PO TBCR: 20.0000 meq | EXTENDED_RELEASE_TABLET | Freq: Every day | ORAL | 0 refills | Status: DC

## 2020-09-03 ENCOUNTER — Other Ambulatory Visit: Payer: Self-pay

## 2020-09-03 DIAGNOSIS — I10 Essential (primary) hypertension: Secondary | ICD-10-CM

## 2020-09-03 MED ORDER — LOSARTAN POTASSIUM 50 MG PO TABS: 50.0000 mg | ORAL_TABLET | Freq: Every day | ORAL | 0 refills | Status: DC

## 2020-09-11 ENCOUNTER — Other Ambulatory Visit: Payer: Medicare Other

## 2020-09-11 ENCOUNTER — Other Ambulatory Visit: Payer: Self-pay

## 2020-09-11 DIAGNOSIS — E876 Hypokalemia: Secondary | ICD-10-CM

## 2020-09-11 DIAGNOSIS — I10 Essential (primary) hypertension: Secondary | ICD-10-CM

## 2020-09-11 MED ORDER — LOSARTAN POTASSIUM 50 MG PO TABS: 50.0000 mg | ORAL_TABLET | Freq: Every day | ORAL | 0 refills | Status: DC

## 2020-09-12 LAB — BMP8+EGFR
BUN/Creatinine Ratio: 16 (ref 12–28)
BUN: 14 mg/dL (ref 8–27)
CO2: 28 mmol/L (ref 20–29)
Calcium: 10.2 mg/dL (ref 8.7–10.3)
Chloride: 100 mmol/L (ref 96–106)
Creatinine, Ser: 0.9 mg/dL (ref 0.57–1.00)
GFR calc Af Amer: 69 mL/min/{1.73_m2} (ref >59)
GFR calc non Af Amer: 60 mL/min/{1.73_m2} (ref >59)
Glucose: 82 mg/dL (ref 65–99)
Potassium: 4.1 mmol/L (ref 3.5–5.2)
Sodium: 142 mmol/L (ref 134–144)

## 2020-09-15 ENCOUNTER — Telehealth: Payer: Medicare Other

## 2020-09-18 ENCOUNTER — Other Ambulatory Visit: Payer: Medicare Other

## 2020-09-24 ENCOUNTER — Ambulatory Visit: Payer: Medicare Other

## 2020-09-24 DIAGNOSIS — F0391 Unspecified dementia with behavioral disturbance: Secondary | ICD-10-CM

## 2020-09-24 DIAGNOSIS — E876 Hypokalemia: Secondary | ICD-10-CM

## 2020-09-24 DIAGNOSIS — I10 Essential (primary) hypertension: Secondary | ICD-10-CM

## 2020-09-24 NOTE — Chronic Care Management (AMB) (Signed)
Chronic Care Management    Social Work Follow Up Note  09/24/2020 Name: Patricia Simmons MRN: [id number] DOB: [birth year]  Patricia Simmons is a 84 y.o. year old female who is a primary care patient of Arnette Felts, FNP. The CCM team was consulted for assistance with care coordination.   Review of patient status, including review of consultants reports, other relevant assessments, and collaboration with appropriate care team members and the patient's provider was performed as part of comprehensive patient evaluation and provision of chronic care management services.    SDOH (Social Determinants of Health) assessments performed: Yes; no acute challenges identified SDOH Interventions     Most Recent Value  SDOH Interventions  Food Insecurity Interventions Intervention Not Indicated  Housing Interventions Intervention Not Indicated  Transportation Interventions Intervention Not Indicated       Outpatient Encounter Medications as of 09/24/2020  Medication Sig   acetaminophen (TYLENOL) 500 MG tablet Take 1,000 mg by mouth every 6 (six) hours as needed for mild pain or headache.   citalopram (CELEXA) 10 MG tablet Take 1 tablet (10 mg total) by mouth daily.   diclofenac sodium (VOLTAREN) 1 % GEL Apply 2 g topically 4 (four) times daily.   donepezil (ARICEPT) 10 MG tablet Take 1 tablet (10 mg total) by mouth at bedtime.   hydrochlorothiazide (HYDRODIURIL) 12.5 MG tablet Take 1 tablet (12.5 mg total) by mouth daily.   ipratropium (ATROVENT) 0.03 % nasal spray Place 2 sprays into both nostrils every 12 (twelve) hours.   losartan (COZAAR) 50 MG tablet Take 1 tablet (50 mg total) by mouth daily.   magic mouthwash w/lidocaine SOLN Take 5 mLs by mouth 3 (three) times daily as needed for mouth pain.   memantine (NAMENDA) 10 MG tablet Start with one pill at bedtime. 2 weeks later, if no side effects, increase to one pill twice daily   omeprazole (PRILOSEC) 40 MG capsule Take 1 capsule by mouth  once daily   potassium chloride 20 MEQ TBCR Take 20 mEq by mouth daily.   QUEtiapine (SEROQUEL) 25 MG tablet Take 1 tab by mouth in am and 2 tabs by mouth at bedtime   triamcinolone cream (KENALOG) 0.1 % Apply 1 application topically 2 (two) times daily.   No facility-administered encounter medications on file as of 09/24/2020.     Patient Care Plan: Social Work    Problem Identified: Care Coordination     Long-Range Goal: Colaborate with RN Care Manager to perform appropriate assessments to assist with care coordination needs   Start Date: 09/24/2020  Priority: Medium  Note:   Current Barriers:   Chronic disease management support and education needs related to HTN, Dementia, and Hypokalemia    Memory Deficits  Unable to perform IADLs independently Social Worker Clinical Goal(s):   Over the next 120 days, patient will work with SW to identify and address any acute and/or chronic care coordination needs related to the self health management of HTN, Dementia, and Hypokalemia    Over the next 30 days the patient, with the help of her caregiver, will schedule a COVID 19 booster vaccine CCM SW Interventions:   Inter-disciplinary care team collaboration (see longitudinal plan of care)  Successful outbound call placed to the patients daughter/caregiver Patricia Simmons to assist with care coordination needs  Determined the patient is doing well with no acute needs at this time  [name] reports the patients dementia has not progressed much as she is still able to dress herself with minimal assistance  Discussed interested in the PACE program - family would like more information  Mailed family a brochure of [facility]  Determined the patient attempted to receive a COVID vaccine booster this past week at [facility] but was told it was too soon  Performed chart review to note last vaccine was administered 01/30/20  Provided contact number to [facility] vaccine scheduling line  to allow patient to receive vaccine at an upcomming clinic  Discussed long term follow up with SW while patient remains actively involved with  RN Case Manager  to address care management needs  Scheduled follow up call over the next 60 days Patient Goals/Self-Care Activities With the help of patient caregiver Patricia Simmons  Over the next 60 days, patient will:   - Patient verbalizes understanding of plan to contact SW as needed prior to next scheduled call Self administers medications as prescribed Attends all scheduled provider appointments Calls pharmacy for medication refills Calls provider office for new concerns or questions Receive COVID 19 booster Follow Up Plan:  SW will follow up with the patient over the next 60 days       Follow Up Plan: SW will follow up with patient by phone over the next 60 days   Bevelyn Ngo, BSW, CDP Social Worker, Certified Dementia Practitioner [facility] Care Management [phone]  Total time spent performing care coordination and/or care management activities with the patient by phone or face to face = 31 minutes.

## 2020-09-24 NOTE — Patient Instructions (Signed)
Patient Goals/Self-Care Activities With the help of patient caregiver Asani Deniston . Over the next 60 days, patient will:   - Patient verbalizes understanding of plan to contact SW as needed prior to next scheduled call Self administers medications as prescribed Attends all scheduled provider appointments Calls pharmacy for medication refills Calls provider office for new concerns or questions Receive COVID 19 booster

## 2020-09-29 ENCOUNTER — Telehealth: Payer: Self-pay

## 2020-09-29 ENCOUNTER — Telehealth: Payer: Medicare Other

## 2020-09-29 NOTE — Telephone Encounter (Cosign Needed)
  Chronic Care Management   Outreach Note  09/29/2020 Name: Patricia Simmons MRN: [id number] DOB: 06-21-1936  Referred by: Arnette Felts, FNP Reason for referral : Chronic Care Management (RNCM FU Call )   An unsuccessful telephone outreach was attempted today. The patient was referred to the case management team for assistance with care management and care coordination.   Follow Up Plan: A HIPAA compliant phone message was left for the patient providing contact information and requesting a return call. Telephone follow up appointment with care management team member scheduled for: 11/11/20  Delsa Sale, RN, BSN, CCM Care Management Coordinator [facility] Care Management/[facility]  Direct Phone: [phone]

## 2020-10-11 ENCOUNTER — Ambulatory Visit: Payer: Medicare Other

## 2020-10-25 ENCOUNTER — Ambulatory Visit: Payer: Medicare Other | Attending: Internal Medicine

## 2020-10-25 DIAGNOSIS — Z23 Encounter for immunization: Secondary | ICD-10-CM

## 2020-10-25 NOTE — Progress Notes (Signed)
   Covid-19 Vaccination Clinic  Name:  Patricia Simmons    MRN: [id number] DOB: [birth year]  10/25/2020  Ms. Grange was observed post Covid-19 immunization for 15 minutes without incident. She was provided with Vaccine Information Sheet and instruction to access the V-Safe system.   Ms. Carbon was instructed to call 911 with any severe reactions post vaccine: [name] Kitchen Difficulty breathing  . Swelling of face and throat  . A fast heartbeat  . A bad rash all over body  . Dizziness and weakness   Immunizations Administered    Name Date Dose VIS Date Route   Pfizer COVID-19 Vaccine 10/25/2020 11:47 AM 0.3 mL 08/27/2020 Intramuscular   Manufacturer: [employer], [employer]   Lot: [id number]   NDC: [address]-8

## 2020-11-11 ENCOUNTER — Telehealth: Payer: Self-pay

## 2020-11-11 ENCOUNTER — Telehealth: Payer: Medicare Other

## 2020-11-11 NOTE — Telephone Encounter (Cosign Needed)
  Chronic Care Management   Outreach Note  11/11/2020 Name: Patricia Simmons MRN: [id number] DOB: [birth year]  Referred by: Arnette Felts, FNP Reason for referral : Chronic Care Management (RN CM FU Call )   [name] is enrolled in a Managed Medicaid Health Plan: No  An unsuccessful telephone outreach was attempted today. The patient was referred to the case management team for assistance with care management and care coordination.   Follow Up Plan: A HIPAA compliant phone message was left for the patient providing contact information and requesting a return call. Telephone follow up appointment with care management team member scheduled for: 12/10/20  Delsa Sale, RN, BSN, CCM Care Management Coordinator [facility] Care Management/[facility]  Direct Phone: [phone]

## 2020-11-13 ENCOUNTER — Ambulatory Visit: Payer: Medicare Other

## 2020-11-13 DIAGNOSIS — F0391 Unspecified dementia with behavioral disturbance: Secondary | ICD-10-CM

## 2020-11-13 DIAGNOSIS — I10 Essential (primary) hypertension: Secondary | ICD-10-CM

## 2020-11-13 NOTE — Patient Instructions (Signed)
  Goals we discussed today:  Goals Addressed            This Visit's Progress   . Work with SW to manage care coordination needs       Timeframe:  Long-Range Goal Priority:  [employer] Start Date:   11.17.21                          Expected End Date: 4.6.22                      Next planned outreach:2.8.22  Patient Goals/Self-Care Activities With the help of patient caregiver Jesika Men . Over the next 60 days, patient will:   - Patient verbalizes understanding of plan to contact SW as needed prior to next scheduled call Self administers medications as prescribed Attends all scheduled provider appointments Calls pharmacy for medication refills Calls provider office for new concerns or questions

## 2020-11-13 NOTE — Chronic Care Management (AMB) (Signed)
Chronic Care Management    Social Work Follow Up Note  11/13/2020 Name: Patricia Simmons MRN: [id number] DOB: Jun 20, 1936  Patricia Simmons is a 85 y.o. year old female who is a primary care patient of Patricia Simmons, [place]. The CCM team was consulted for assistance with care coordination.   Review of patient status, including review of consultants reports, other relevant assessments, and collaboration with appropriate care team members and the patient's provider was performed as part of comprehensive patient evaluation and provision of chronic care management services.    SDOH (Social Determinants of Health) assessments performed: No    Outpatient Encounter Medications as of 11/13/2020  Medication Sig  . acetaminophen (TYLENOL) 500 MG tablet Take 1,000 mg by mouth every 6 (six) hours as needed for mild pain or headache.  . citalopram (CELEXA) 10 MG tablet Take 1 tablet (10 mg total) by mouth daily.  . diclofenac sodium (VOLTAREN) 1 % GEL Apply 2 g topically 4 (four) times daily.  [name] Kitchen donepezil (ARICEPT) 10 MG tablet Take 1 tablet (10 mg total) by mouth at bedtime.  . hydrochlorothiazide (HYDRODIURIL) 12.5 MG tablet Take 1 tablet (12.5 mg total) by mouth daily.  [name] Kitchen ipratropium (ATROVENT) 0.03 % nasal spray Place 2 sprays into both nostrils every 12 (twelve) hours.  [name] Kitchen losartan (COZAAR) 50 MG tablet Take 1 tablet (50 mg total) by mouth daily.  . magic mouthwash w/lidocaine SOLN Take 5 mLs by mouth 3 (three) times daily as needed for mouth pain.  . memantine (NAMENDA) 10 MG tablet Start with one pill at bedtime. 2 weeks later, if no side effects, increase to one pill twice daily  . omeprazole (PRILOSEC) 40 MG capsule Take 1 capsule by mouth once daily  . potassium chloride 20 MEQ TBCR Take 20 mEq by mouth daily.  . QUEtiapine (SEROQUEL) 25 MG tablet Take 1 tab by mouth in am and 2 tabs by mouth at bedtime  . triamcinolone cream (KENALOG) 0.1 % Apply 1 application topically 2 (two) times daily.   No  facility-administered encounter medications on file as of 11/13/2020.     Patient Care Plan: Social Work    Problem Identified: Care Coordination     Long-Range Goal: Colaborate with RN Care Manager to perform appropriate assessments to assist with care coordination needs   Start Date: 09/24/2020  Expected End Date: 02/11/2021  This Visit's Progress: On track  Priority: Medium  Note:   Current Barriers:  . Chronic disease management support and education needs related to HTN, Dementia, and Hypokalemia   . Memory Deficits . Unable to perform IADLs independently  Social Worker Clinical Goal(s):  [name] Kitchen Over the next 120 days, patient will work with SW to identify and address any acute and/or chronic care coordination needs related to the self health management of HTN, Dementia, and Hypokalemia   . Over the next 30 days the patient, with the help of her caregiver, will schedule a COVID 19 booster vaccine Goal Met  CCM SW Interventions:  . 1:1 collaboration with Patricia Simmons, [place] regarding development and update of comprehensive plan of care as evidenced by provider attestation and co-signature . Inter-disciplinary care team collaboration (see longitudinal plan of care) . Successful outbound call placed to Patricia Simmons to follow up on goal progression . Determined the patient has successfully scheduled and received her COVID 19 booster - administered on 10/25/20 . Discussed Patricia Simmons feels the patient needs a colonoscopy due to bowel habits . Performed chart review to note upcomming PCP appointment  scheduled for 12/02/20 - Patricia Simmons denies needing a sooner appointment and agree to speak with the patients provider about bowel concerns on 1/25 . Scheduled follow up call over the next month to assist with care coordination needs  Patient Goals/Self-Care Activities With the help of patient caregiver Patricia Simmons . Over the next 60 days, patient will:   - Patient verbalizes understanding of plan to contact SW as  needed prior to next scheduled call Self administers medications as prescribed Attends all scheduled provider appointments Calls pharmacy for medication refills Calls provider office for new concerns or questions   Follow Up Plan:  SW will follow up with the patient over the next 30 days        Follow Up Plan: SW will follow up with patient by phone over the next month.   Patricia Simmons, BSW, CDP Social Worker, Certified Dementia Practitioner [place] [facility] [place] [facility] [phone]  Total time spent performing care coordination and/or care management activities with the patient by phone or face to face = 15 minutes.

## 2020-12-02 ENCOUNTER — Encounter: Payer: Self-pay | Admitting: Nurse Practitioner

## 2020-12-02 ENCOUNTER — Other Ambulatory Visit: Payer: Self-pay

## 2020-12-02 ENCOUNTER — Ambulatory Visit (INDEPENDENT_AMBULATORY_CARE_PROVIDER_SITE_OTHER): Payer: Medicare Other | Admitting: Nurse Practitioner

## 2020-12-02 VITALS — BP 102/70 | HR 80 | Temp 98.3°F | Ht 64.0 in | Wt 144.2 lb

## 2020-12-02 DIAGNOSIS — R109 Unspecified abdominal pain: Secondary | ICD-10-CM | POA: Diagnosis not present

## 2020-12-02 DIAGNOSIS — I10 Essential (primary) hypertension: Secondary | ICD-10-CM | POA: Diagnosis not present

## 2020-12-02 DIAGNOSIS — Z79899 Other long term (current) drug therapy: Secondary | ICD-10-CM | POA: Diagnosis not present

## 2020-12-02 DIAGNOSIS — Z23 Encounter for immunization: Secondary | ICD-10-CM

## 2020-12-02 DIAGNOSIS — E876 Hypokalemia: Secondary | ICD-10-CM | POA: Diagnosis not present

## 2020-12-02 DIAGNOSIS — F0390 Unspecified dementia without behavioral disturbance: Secondary | ICD-10-CM | POA: Diagnosis not present

## 2020-12-02 DIAGNOSIS — R5383 Other fatigue: Secondary | ICD-10-CM

## 2020-12-02 MED ORDER — TETANUS-DIPHTH-ACELL PERTUSSIS 5-2.5-18.5 LF-MCG/0.5 IM SUSP
0.5000 mL | Freq: Once | INTRAMUSCULAR | 0 refills | Status: AC
Start: 2020-12-02 — End: 2020-12-02

## 2020-12-02 MED ORDER — PNEUMOCOCCAL 13-VAL CONJ VACC IM SUSP: 0.5000 mL | INTRAMUSCULAR | 0 refills | Status: AC

## 2020-12-02 MED ORDER — POTASSIUM CHLORIDE ER 20 MEQ PO TBCR: 20.0000 meq | EXTENDED_RELEASE_TABLET | Freq: Every day | ORAL | 1 refills | Status: DC

## 2020-12-02 NOTE — Patient Instructions (Signed)

## 2020-12-02 NOTE — Progress Notes (Signed)
I,Dalon Reichart [employer] as a Education administrator for [employer], FNP.,have documented all relevant documentation on the behalf of [name], FNP,as directed by  [name], FNP while in the presence of [name], [place]. This visit occurred during the SARS-CoV-2 public health emergency.  Safety protocols were in place, including screening questions prior to the visit, additional usage of staff PPE, and extensive cleaning of exam room while observing appropriate contact time as indicated for disinfecting solutions.  Subjective:     Patient ID: Patricia Simmons , female    DOB: 1936/09/17 , 85 y.o.   MRN: [id number]   Chief Complaint  Patient presents with  . Hypertension    HPI  Patient presents today for a blood pressure f/u. Patient's daughter stated the patient has been sleeping a lot and she stated she also has been using the bathroom twice a day. She is eating breakfast in the morning at 9a.  Her daughter reports her sister who used to care for her mother now has dementia worse than her mother at the age of 1.   Hypertension This is a chronic problem. The current episode started more than 1 year ago. The problem is unchanged. The problem is controlled. Pertinent negatives include no anxiety, blurred vision, chest pain, palpitations or shortness of breath. There are no associated agents to hypertension. Risk factors for coronary artery disease include sedentary lifestyle. Past treatments include angiotensin blockers. There are no compliance problems.  There is no history of angina. There is no history of chronic renal disease.     Past Medical History:  Diagnosis Date  . Arthritis   . Blood transfusion without reported diagnosis   . Hypertension      Family History  Problem Relation Age of Onset  . Stroke Mother   . Hypertension Mother   . Prostate cancer Father   . Hypertension Sister   . Hyperlipidemia Sister   . Emphysema Brother   . Lung cancer Brother   . Dementia Daughter       Current Outpatient Medications:  .  acetaminophen (TYLENOL) 500 MG tablet, Take 1,000 mg by mouth every 6 (six) hours as needed for mild pain or headache., Disp: , Rfl:  .  diclofenac sodium (VOLTAREN) 1 % GEL, Apply 2 g topically 4 (four) times daily., Disp: 1 Tube, Rfl: 2 .  donepezil (ARICEPT) 10 MG tablet, Take 1 tablet (10 mg total) by mouth at bedtime., Disp: 90 tablet, Rfl: 6 .  hydrochlorothiazide (HYDRODIURIL) 12.5 MG tablet, Take 1 tablet (12.5 mg total) by mouth daily., Disp: 90 tablet, Rfl: 1 .  ipratropium (ATROVENT) 0.03 % nasal spray, Place 2 sprays into both nostrils every 12 (twelve) hours., Disp: 30 mL, Rfl: 12 .  magic mouthwash w/lidocaine SOLN, Take 5 mLs by mouth 3 (three) times daily as needed for mouth pain., Disp: 120 mL, Rfl: 0 .  memantine (NAMENDA) 10 MG tablet, Start with one pill at bedtime. 2 weeks later, if no side effects, increase to one pill twice daily, Disp: 60 tablet, Rfl: 6 .  triamcinolone cream (KENALOG) 0.1 %, Apply 1 application topically 2 (two) times daily., Disp: 30 g, Rfl: 0 .  citalopram (CELEXA) 10 MG tablet, Take 1 tablet by mouth once daily, Disp: 30 tablet, Rfl: 0 .  losartan (COZAAR) 50 MG tablet, Take 1 tablet by mouth once daily, Disp: 90 tablet, Rfl: 0 .  omeprazole (PRILOSEC) 40 MG capsule, Take 1 capsule by mouth once daily, Disp: 90 capsule, Rfl: 0 .  Potassium Chloride ER 20 MEQ TBCR, Take 1 tablet by mouth once daily, Disp: 90 tablet, Rfl: 0 .  QUEtiapine (SEROQUEL) 25 MG tablet, Take 1 tablet by mouth once daily, Disp: 30 tablet, Rfl: 0   Allergies  Allergen Reactions  . Codeine     unknown     Review of Systems  Constitutional: Positive for fatigue.  Eyes: Negative for blurred vision.  Respiratory: Negative.  Negative for shortness of breath.   Cardiovascular: Negative.  Negative for chest pain, palpitations and leg swelling.  Gastrointestinal: Negative.   Musculoskeletal: Positive for back pain (after sitting on  table).  Neurological: Negative.   Psychiatric/Behavioral: Negative.      Today's Vitals   12/02/20 1033  BP: 102/70  Pulse: 80  Temp: 98.3 F (36.8 C)  TempSrc: Oral  Weight: 144 lb 3.2 oz (65.4 kg)  Height: '5\' 4"'  (1.626 m)  PainSc: 0-No pain   Body mass index is 24.75 kg/m.   Objective:  Physical Exam Vitals reviewed.  Constitutional:      General: She is not in acute distress. Cardiovascular:     Rate and Rhythm: Normal rate and regular rhythm.     Pulses: Normal pulses.     Heart sounds: Normal heart sounds. No murmur heard.   Pulmonary:     Effort: Pulmonary effort is normal. No respiratory distress.     Breath sounds: Normal breath sounds. No wheezing.  Neurological:     General: No focal deficit present.     Mental Status: She is alert and oriented to person, place, and time.     Cranial Nerves: No cranial nerve deficit.     Motor: No weakness.  Psychiatric:        Attention and Perception: She is inattentive.        Mood and Affect: Mood normal. Affect is flat.        Behavior: Behavior normal.        Thought Content: Thought content normal.        Cognition and Memory: Cognition is impaired. Memory is impaired.        Judgment: Judgment normal. Judgment is not impulsive.         Assessment And Plan:     1. Essential hypertension Chronic, blood pressure is well controlled - CMP14+EGFR  2. Hypokalemia Will check her BMP  Continue with potassium supplement  3. Dementia without behavioral disturbance, unspecified dementia type (HCC) Chronic, stable No significant changes according to her daughter She is encouraged to follow up with Dr. Cathren Laine office.   The patient does not talk much during visit  4. Fatigue, unspecified type  Will check for metabolic causes  The fatigue could be related to disease process - TSH - Vitamin B12 - POCT Urinalysis Dipstick (81002)  5. Flank pain  She grabs her flank area and complains of pain   Will check  urine and urine culture to ensure does not have a urinary tract infection - POCT Urinalysis Dipstick (70962) - Urine Culture  6. Encounter for immunization - pneumococcal 13-valent conjugate vaccine (PREVNAR 13) SUSP injection; Inject 0.5 mLs into the muscle tomorrow at 10 am for 1 dose.  Dispense: 0.5 mL; Refill: 0 - Tdap (BOOSTRIX) 5-2.5-18.5 LF-MCG/0.5 injection; Inject 0.5 mLs into the muscle once for 1 dose.  Dispense: 0.5 mL; Refill: 0     Patient was given opportunity to ask questions. Patient verbalized understanding of the plan and was able to repeat key elements of the plan.  All questions were answered to their satisfaction.  [name], FNP    I, [name], FNP, have reviewed all documentation for this visit. The documentation on 12/02/20 for the exam, diagnosis, procedures, and orders are all accurate and complete.  THE PATIENT IS ENCOURAGED TO PRACTICE SOCIAL DISTANCING DUE TO THE COVID-19 PANDEMIC.

## 2020-12-03 DIAGNOSIS — R109 Unspecified abdominal pain: Secondary | ICD-10-CM | POA: Diagnosis not present

## 2020-12-03 LAB — CMP14+EGFR
ALT: 6 IU/L (ref 0–32)
AST: 12 IU/L (ref 0–40)
Albumin/Globulin Ratio: 1.5 (ref 1.2–2.2)
Albumin: 4.1 g/dL (ref 3.6–4.6)
Alkaline Phosphatase: 81 IU/L (ref 44–121)
BUN/Creatinine Ratio: 8 — ABNORMAL LOW (ref 12–28)
BUN: 8 mg/dL (ref 8–27)
Bilirubin Total: 0.4 mg/dL (ref 0.0–1.2)
CO2: 25 mmol/L (ref 20–29)
Calcium: 9.6 mg/dL (ref 8.7–10.3)
Chloride: 99 mmol/L (ref 96–106)
Creatinine, Ser: 0.95 mg/dL (ref 0.57–1.00)
GFR calc Af Amer: 65 mL/min/{1.73_m2} (ref >59)
GFR calc non Af Amer: 56 mL/min/{1.73_m2} — ABNORMAL LOW (ref >59)
Globulin, Total: 2.7 g/dL (ref 1.5–4.5)
Glucose: 77 mg/dL (ref 65–99)
Potassium: 3.6 mmol/L (ref 3.5–5.2)
Sodium: 139 mmol/L (ref 134–144)
Total Protein: 6.8 g/dL (ref 6.0–8.5)

## 2020-12-03 LAB — POCT URINALYSIS DIPSTICK
Bilirubin, UA: NEGATIVE
Blood, UA: NEGATIVE
Glucose, UA: NEGATIVE
Ketones, UA: NEGATIVE
Nitrite, UA: NEGATIVE
Protein, UA: NEGATIVE
Spec Grav, UA: 1.025 (ref 1.010–1.025)
Urobilinogen, UA: 0.2 E.U./dL
pH, UA: 6 (ref 5.0–8.0)

## 2020-12-03 LAB — VITAMIN B12: Vitamin B-12: 339 pg/mL (ref 232–1245)

## 2020-12-03 LAB — TSH: TSH: 2.2 u[IU]/mL (ref 0.450–4.500)

## 2020-12-07 LAB — URINE CULTURE

## 2020-12-10 ENCOUNTER — Telehealth: Payer: Medicare Other

## 2020-12-10 ENCOUNTER — Ambulatory Visit: Payer: Self-pay

## 2020-12-10 DIAGNOSIS — E876 Hypokalemia: Secondary | ICD-10-CM

## 2020-12-10 DIAGNOSIS — I10 Essential (primary) hypertension: Secondary | ICD-10-CM

## 2020-12-10 DIAGNOSIS — F0390 Unspecified dementia without behavioral disturbance: Secondary | ICD-10-CM

## 2020-12-12 ENCOUNTER — Other Ambulatory Visit: Payer: Self-pay | Admitting: Nurse Practitioner

## 2020-12-12 DIAGNOSIS — I10 Essential (primary) hypertension: Secondary | ICD-10-CM

## 2020-12-12 DIAGNOSIS — K219 Gastro-esophageal reflux disease without esophagitis: Secondary | ICD-10-CM

## 2020-12-12 DIAGNOSIS — F32A Depression, unspecified: Secondary | ICD-10-CM

## 2020-12-12 DIAGNOSIS — F0391 Unspecified dementia with behavioral disturbance: Secondary | ICD-10-CM

## 2020-12-12 DIAGNOSIS — E876 Hypokalemia: Secondary | ICD-10-CM

## 2020-12-16 ENCOUNTER — Ambulatory Visit (INDEPENDENT_AMBULATORY_CARE_PROVIDER_SITE_OTHER): Payer: Medicare Other

## 2020-12-16 DIAGNOSIS — F0390 Unspecified dementia without behavioral disturbance: Secondary | ICD-10-CM | POA: Diagnosis not present

## 2020-12-16 DIAGNOSIS — I10 Essential (primary) hypertension: Secondary | ICD-10-CM

## 2020-12-16 NOTE — Patient Instructions (Signed)
Goals we discussed today:  Goals Addressed            This Visit's Progress   . Work with SW to manage care coordination needs       Timeframe:  Long-Range Goal Priority:  [employer] Start Date:   11.17.21                          Expected End Date: 4.6.22                      Next planned outreach: 4.27.22  Patient Goals/Self-Care Activities With the help of patient caregiver Charlsie Fleeger . Over the next 90 days, patient will:   - Patient verbalizes understanding of plan to contact SW as needed prior to next scheduled call Self administers medications as prescribed Attends all scheduled provider appointments Calls pharmacy for medication refills Calls provider office for new concerns or questions

## 2020-12-16 NOTE — Chronic Care Management (AMB) (Signed)
Chronic Care Management    Social Work Note  12/16/2020 Name: Patricia Simmons MRN: [id number] DOB: June 12, 1936  Patricia Simmons is a 85 y.o. year old female who is a primary care patient of [name], [place]. The CCM team was consulted to assist the patient with chronic disease management and/or care coordination needs related to: [employer] .   Engaged with patient daughter/caregiver Patricia Simmons by telephone for follow up visit in response to provider referral for social work chronic care management and care coordination services.   Consent to Services:  The patient was given information about Chronic Care Management services, agreed to services, and gave verbal consent prior to initiation of services.  Please see initial visit note for detailed documentation.   Patient agreed to services and consent obtained.   Assessment: Review of patient past medical history, allergies, medications, and health status, including review of relevant consultants reports was performed today as part of a comprehensive evaluation and provision of chronic care management and care coordination services.     SDOH (Social Determinants of Health) assessments and interventions performed:    Advanced Directives Status: Not addressed in this encounter.  CCM Care Plan  Allergies  Allergen Reactions  . Codeine     unknown    Outpatient Encounter Medications as of 12/16/2020  Medication Sig  . acetaminophen (TYLENOL) 500 MG tablet Take 1,000 mg by mouth every 6 (six) hours as needed for mild pain or headache.  . citalopram (CELEXA) 10 MG tablet Take 1 tablet by mouth once daily  . diclofenac sodium (VOLTAREN) 1 % GEL Apply 2 g topically 4 (four) times daily.  [name] Kitchen donepezil (ARICEPT) 10 MG tablet Take 1 tablet (10 mg total) by mouth at bedtime.  . hydrochlorothiazide (HYDRODIURIL) 12.5 MG tablet Take 1 tablet (12.5 mg total) by mouth daily.  [name] Kitchen ipratropium (ATROVENT) 0.03 % nasal spray Place 2 sprays into both  nostrils every 12 (twelve) hours.  [name] Kitchen losartan (COZAAR) 50 MG tablet Take 1 tablet by mouth once daily  . magic mouthwash w/lidocaine SOLN Take 5 mLs by mouth 3 (three) times daily as needed for mouth pain.  . memantine (NAMENDA) 10 MG tablet Start with one pill at bedtime. 2 weeks later, if no side effects, increase to one pill twice daily  . omeprazole (PRILOSEC) 40 MG capsule Take 1 capsule by mouth once daily  . Potassium Chloride ER 20 MEQ TBCR Take 1 tablet by mouth once daily  . QUEtiapine (SEROQUEL) 25 MG tablet Take 1 tablet by mouth once daily  . triamcinolone cream (KENALOG) 0.1 % Apply 1 application topically 2 (two) times daily.   No facility-administered encounter medications on file as of 12/16/2020.    Patient Active Problem List   Diagnosis Date Noted  . Lower abdominal pain 07/29/2020  . Increased frequency of urination 06/11/2019  . Hallucination 06/11/2019  . Left hip pain 04/03/2019  . Fall 04/03/2019  . Keloid 12/21/2018  . Dementia with behavioral disturbance ([place]) 09/08/2018  . Depression 09/08/2018  . Hypokalemia 07/30/2018  . Chest pain 07/30/2018  . GERD (gastroesophageal reflux disease) 07/30/2018  . Essential hypertension 07/30/2018    Conditions to be addressed/monitored: HTN and Dementia; care coordination  Care Plan : Social Work  Updates made by Daneen Schick since 12/16/2020 12:00 AM    Problem: Care Coordination     Long-Range Goal: Colaborate with RN Care Manager to perform appropriate assessments to assist with care coordination needs   Start Date: 09/24/2020  Expected  End Date: 02/11/2021  This Visit's Progress: On track  Recent Progress: On track  Priority: Medium  Note:   Current Barriers:  . Chronic disease management support and education needs related to HTN, Dementia, and Hypokalemia   . Memory Deficits . Unable to perform IADLs independently  Social Worker Clinical Goal(s):  [name] Kitchen Over the next 120 days, patient will work with SW to  identify and address any acute and/or chronic care coordination needs related to the self health management of HTN, Dementia, and Hypokalemia   . Over the next 30 days the patient, with the help of her caregiver, will schedule a COVID 19 booster vaccine Goal Met  CCM SW Interventions:  . 1:1 collaboration with [name], [place] regarding development and update of comprehensive plan of care as evidenced by provider attestation and co-signature . Inter-disciplinary care team collaboration (see longitudinal plan of care) . Successful outbound call placed to [facility] to follow up on goal progression . Discussed the patient did attend office visit scheduled for 1.25.22 - a referral for a colonoscopy was not placed , Patricia Simmons feels the patients symptoms have improved and she no longer feels it is necessary . Assessed for acute care coordination needs - no SW needs identified at this time  Patient Goals/Self-Care Activities With the help of patient caregiver Patricia Simmons . Over the next 90 days, patient will:   - Patient verbalizes understanding of plan to contact SW as needed prior to next scheduled call Self administers medications as prescribed Attends all scheduled provider appointments Calls pharmacy for medication refills Calls provider office for new concerns or questions   Follow Up Plan:  SW will follow up with the patient over the next 90 days       Follow Up Plan: SW will follow up with patient by phone over the next 90 days      Daneen Schick, BSW, CDP Social Worker, Certified Dementia Practitioner [facility] [phone]  Total time spent performing care coordination and/or care management activities with the patient by phone or face to face = 13 minutes.

## 2020-12-17 NOTE — Chronic Care Management (AMB) (Signed)
Chronic Care Management   CCM RN Visit Note  12/17/2020 Name: RHEDA KASSAB MRN: [id number] DOB: [birth year]  Subjective: Stephani Police is a 85 y.o. year old female who is a primary care patient of [name], [place]. The care management team was consulted for assistance with disease management and care coordination needs.    Engaged with patient by telephone for follow up visit in response to provider referral for case management and/or care coordination services.   Consent to Services:  The patient was given information about Chronic Care Management services, agreed to services, and gave verbal consent prior to initiation of services.  Please see initial visit note for detailed documentation.   Patient agreed to services and verbal consent obtained.   Assessment: Review of patient past medical history, allergies, medications, health status, including review of consultants reports, laboratory and other test data, was performed as part of comprehensive evaluation and provision of chronic care management services.   SDOH (Social Determinants of Health) assessments and interventions performed:  No   CCM Care Plan  Allergies  Allergen Reactions  . Codeine     unknown    Outpatient Encounter Medications as of 12/10/2020  Medication Sig  . acetaminophen (TYLENOL) 500 MG tablet Take 1,000 mg by mouth every 6 (six) hours as needed for mild pain or headache.  . diclofenac sodium (VOLTAREN) 1 % GEL Apply 2 g topically 4 (four) times daily.  [name] Kitchen donepezil (ARICEPT) 10 MG tablet Take 1 tablet (10 mg total) by mouth at bedtime.  . hydrochlorothiazide (HYDRODIURIL) 12.5 MG tablet Take 1 tablet (12.5 mg total) by mouth daily.  [name] Kitchen ipratropium (ATROVENT) 0.03 % nasal spray Place 2 sprays into both nostrils every 12 (twelve) hours.  . magic mouthwash w/lidocaine SOLN Take 5 mLs by mouth 3 (three) times daily as needed for mouth pain.  . memantine (NAMENDA) 10 MG tablet Start with one pill at bedtime. 2  weeks later, if no side effects, increase to one pill twice daily  . triamcinolone cream (KENALOG) 0.1 % Apply 1 application topically 2 (two) times daily.  . [DISCONTINUED] citalopram (CELEXA) 10 MG tablet Take 1 tablet (10 mg total) by mouth daily.  . [DISCONTINUED] losartan (COZAAR) 50 MG tablet Take 1 tablet (50 mg total) by mouth daily.  . [DISCONTINUED] omeprazole (PRILOSEC) 40 MG capsule Take 1 capsule by mouth once daily  . [DISCONTINUED] Potassium Chloride ER 20 MEQ TBCR Take 20 mEq by mouth daily.  . [DISCONTINUED] QUEtiapine (SEROQUEL) 25 MG tablet Take 1 tab by mouth in am and 2 tabs by mouth at bedtime   No facility-administered encounter medications on file as of 12/10/2020.    Patient Active Problem List   Diagnosis Date Noted  . Lower abdominal pain 07/29/2020  . Increased frequency of urination 06/11/2019  . Hallucination 06/11/2019  . Left hip pain 04/03/2019  . Fall 04/03/2019  . Keloid 12/21/2018  . Dementia with behavioral disturbance ([place]) 09/08/2018  . Depression 09/08/2018  . Hypokalemia 07/30/2018  . Chest pain 07/30/2018  . GERD (gastroesophageal reflux disease) 07/30/2018  . Essential hypertension 07/30/2018    Conditions to be addressed/monitored:HTN, Dementia, Hypokalemia  There are no care plans that you recently modified to display for this patient.  Goals Addressed    . COMPLETED: To maintain serum Potassium level within normal range per MD recommendation       Current Barriers:  [name] Kitchen Knowledge Deficits related to treatment management of Hypokalemia . Cognitive Deficits . Chronic Disease Management  support and education needs related to Hypokalemia Nurse Case Manager Clinical Goal(s):  [name] Kitchen Over the next 60 days, patient will work with the CCM team and PCP to address needs related to Hypokalemia  Goal Met  . New 08/04/20 Over the next 30 days, patient will have her K+ blood level rechecked at PCP office for evaluation and treatment of hypokalemia . New  07/30/20 Over the next 180 days, patient and caregiver will work with the CCM team and PCP for disease education and support for improved Self Health management of hypokalemia  CCM RN CM Interventions:  12/10/20 Successful call completed with patient's daughter Tatia Petrucci  . Evaluation of current treatment plan related to Hypokalemia as established by provider . Reviewed and discussed PCP prescribed Potassium Chloride 10 meq, take 1 tablet qd for support of hypokalemia . Determined patient's Potassium is currently within normal range at 3.6; Educated on dietary recommendations for Potassium rich foods  . Assessed for medication adherence, confirmed patient's daughter [name] is administering Ms. Foree's medications w/o difficulty or missed doses . Discussed plans with patient for ongoing care management follow up and provided patient with direct contact information for care management team Patient Self Care Activities:  . Unable to independently perform self care  Please see past updates related to this goal by clicking on the "Past Updates" button in the selected goal        Plan:Telephone follow up appointment with care management team member scheduled for:  04/02/21  [name], RN, BSN, CCM Care Management Coordinator [facility]  Direct Phone: [phone]

## 2020-12-17 NOTE — Patient Instructions (Signed)
Goals Addressed    . COMPLETED: To maintain serum Potassium level within normal range per MD recommendation       Current Barriers:  [name] Kitchen Knowledge Deficits related to treatment management of Hypokalemia . Cognitive Deficits . Chronic Disease Management support and education needs related to Hypokalemia Nurse Case Manager Clinical Goal(s):  [name] Kitchen Over the next 60 days, patient will work with the CCM team and PCP to address needs related to Hypokalemia  Goal Met  . New 08/04/20 Over the next 30 days, patient will have her K+ blood level rechecked at PCP office for evaluation and treatment of hypokalemia . New 07/30/20 Over the next 180 days, patient and caregiver will work with the CCM team and PCP for disease education and support for improved Self Health management of hypokalemia  CCM RN CM Interventions:  12/10/20 Successful call completed with patient's daughter Min Tunnell  . Evaluation of current treatment plan related to Hypokalemia as established by provider . Reviewed and discussed PCP prescribed Potassium Chloride 10 meq, take 1 tablet qd for support of hypokalemia . Determined patient's Potassium is currently within normal range at 3.6; Educated on dietary recommendations for Potassium rich foods  . Assessed for medication adherence, confirmed patient's daughter [name] is administering Ms. Todisco's medications w/o difficulty or missed doses . Discussed plans with patient for ongoing care management follow up and provided patient with direct contact information for care management team Patient Self Care Activities:  . Unable to independently perform self care  Please see past updates related to this goal by clicking on the "Past Updates" button in the selected goal

## 2020-12-19 ENCOUNTER — Telehealth: Payer: Self-pay

## 2020-12-19 NOTE — Telephone Encounter (Signed)
Left the patient a message to call back for lab results. 

## 2020-12-19 NOTE — Telephone Encounter (Signed)
-----   Message from Arnette Felts, FNP sent at 12/17/2020  5:58 PM EST ----- The urine sample you gave is contaminated and needs to be done again if we are concerned about a urinary tract infection, you can come and repeat it next week or come and get a cup and return if this is easier.

## 2020-12-22 ENCOUNTER — Other Ambulatory Visit: Payer: Medicare Other

## 2020-12-22 ENCOUNTER — Other Ambulatory Visit: Payer: Self-pay

## 2020-12-22 DIAGNOSIS — R109 Unspecified abdominal pain: Secondary | ICD-10-CM | POA: Diagnosis not present

## 2020-12-24 LAB — URINE CULTURE

## 2021-02-08 ENCOUNTER — Other Ambulatory Visit: Payer: Self-pay | Admitting: Neurology

## 2021-02-08 ENCOUNTER — Other Ambulatory Visit: Payer: Self-pay | Admitting: Nurse Practitioner

## 2021-02-08 DIAGNOSIS — F32A Depression, unspecified: Secondary | ICD-10-CM

## 2021-02-08 DIAGNOSIS — F0391 Unspecified dementia with behavioral disturbance: Secondary | ICD-10-CM

## 2021-03-04 ENCOUNTER — Ambulatory Visit: Payer: Medicare Other

## 2021-03-04 DIAGNOSIS — F0390 Unspecified dementia without behavioral disturbance: Secondary | ICD-10-CM

## 2021-03-04 DIAGNOSIS — I10 Essential (primary) hypertension: Secondary | ICD-10-CM

## 2021-03-04 NOTE — Chronic Care Management (AMB) (Signed)
Chronic Care Management    Social Work Note  03/04/2021 Name: Patricia Simmons MRN: [id number] DOB: 02/15/1936  Patricia Simmons is a 85 y.o. year old female who is a primary care patient of Patricia Simmons, Patricia Simmons. The CCM team was consulted to assist the patient with chronic disease management and/or care coordination needs related to: [employer] .   Engaged with patients daughter/caregiver by phone for follow up visit in response to provider referral for social work chronic care management and care coordination services.   Consent to Services:  The patient was given information about Chronic Care Management services, agreed to services, and gave verbal consent prior to initiation of services.  Please see initial visit note for detailed documentation.   Patient agreed to services and consent obtained.   Assessment: Review of patient past medical history, allergies, medications, and health status, including review of relevant consultants reports was performed today as part of a comprehensive evaluation and provision of chronic care management and care coordination services.     SDOH (Social Determinants of Health) assessments and interventions performed:  SDOH Interventions   Flowsheet Row Most Recent Value  SDOH Interventions   Food Insecurity Interventions Intervention Not Indicated  Housing Interventions Intervention Not Indicated  Transportation Interventions Intervention Not Indicated       Advanced Directives Status: Not addressed in this encounter.  CCM Care Plan  Allergies  Allergen Reactions  . Codeine     unknown    Outpatient Encounter Medications as of 03/04/2021  Medication Sig  . acetaminophen (TYLENOL) 500 MG tablet Take 1,000 mg by mouth every 6 (six) hours as needed for mild pain or headache.  . citalopram (CELEXA) 10 MG tablet Take 1 tablet by mouth once daily  . diclofenac sodium (VOLTAREN) 1 % GEL Apply 2 g topically 4 (four) times daily.  [name] Kitchen donepezil  (ARICEPT) 10 MG tablet Take 1 tablet (10 mg total) by mouth at bedtime.  . hydrochlorothiazide (HYDRODIURIL) 12.5 MG tablet Take 1 tablet (12.5 mg total) by mouth daily.  [name] Kitchen ipratropium (ATROVENT) 0.03 % nasal spray Place 2 sprays into both nostrils every 12 (twelve) hours.  [name] Kitchen losartan (COZAAR) 50 MG tablet Take 1 tablet by mouth once daily  . magic mouthwash w/lidocaine SOLN Take 5 mLs by mouth 3 (three) times daily as needed for mouth pain.  . memantine (NAMENDA) 10 MG tablet (START) WITH TAKE 1 TABLET BY MOUTH AT BEDTIME 2 WEEKS LATER IF NO SIDE EFFECTS INCREASE TO 1 TAB TWICE DAILY  . omeprazole (PRILOSEC) 40 MG capsule Take 1 capsule by mouth once daily  . Potassium Chloride ER 20 MEQ TBCR Take 1 tablet by mouth once daily  . QUEtiapine (SEROQUEL) 25 MG tablet Take 1 tablet by mouth once daily  . triamcinolone cream (KENALOG) 0.1 % Apply 1 application topically 2 (two) times daily.   No facility-administered encounter medications on file as of 03/04/2021.    Patient Active Problem List   Diagnosis Date Noted  . Lower abdominal pain 07/29/2020  . Increased frequency of urination 06/11/2019  . Hallucination 06/11/2019  . Left hip pain 04/03/2019  . Fall 04/03/2019  . Keloid 12/21/2018  . Dementia without behavioral disturbance ([place]) 09/08/2018  . Depression 09/08/2018  . Hypokalemia 07/30/2018  . Chest pain 07/30/2018  . GERD (gastroesophageal reflux disease) 07/30/2018  . Essential hypertension 07/30/2018    Conditions to be addressed/monitored: HTN and Dementia  Care Plan : Social Work  Updates made by Patricia Simmons since 03/04/2021  12:00 AM  Completed 03/04/2021  Problem: Care Coordination Resolved 03/04/2021    Long-Range Goal: Colaborate with RN Care Manager to perform appropriate assessments to assist with care coordination needs Completed 03/04/2021  Start Date: 09/24/2020  Expected End Date: 02/11/2021  Recent Progress: On track  Priority: Medium  Note:   Current  Barriers:  . Chronic disease management support and education needs related to HTN, Dementia, and Hypokalemia   . Memory Deficits . Unable to perform IADLs independently  Social Worker Clinical Goal(s):  [name] Kitchen Over the next 120 days, patient will work with SW to identify and address any acute and/or chronic care coordination needs related to the self health management of HTN, Dementia, and Hypokalemia   . Over the next 30 days the patient, with the help of her caregiver, will schedule a COVID 19 booster vaccine Goal Met  CCM SW Interventions:  . 1:1 collaboration with Patricia Simmons, [place] regarding development and update of comprehensive plan of care as evidenced by provider attestation and co-signature . Inter-disciplinary care team collaboration (see longitudinal plan of care) . Successful outbound call placed to [facility] to follow up on goal progression and update [facility] screen . Discussed the patient continues to do well in the home - no acute SW challenges identified at this time . Discussed plan for SW closure while patient remains active with RN Care Manager to address care management needs . Encouraged Patricia Simmons to contact SW as needed with future care coordination needs  Patient Goals/Self-Care Activities With the help of patient caregiver Patricia Simmons   - Patient verbalizes understanding of plan to contact SW as needed  Self administers medications as prescribed Attends all scheduled provider appointments Calls pharmacy for medication refills Calls provider office for new concerns or questions   Follow Up Plan: No SW follow up planned at this time. Goal Met       Follow Up Plan: No SW follow up planned at this time. The patient will remain active with RN Care Manager and is encouraged to contact SW as needed.      Patricia Simmons, BSW, CDP Social Worker, Certified Dementia Practitioner [place] [facility] [place] [facility] [phone]  Total time spent performing care coordination and/or care  management activities with the patient by phone or face to face = 15 minutes.

## 2021-03-04 NOTE — Patient Instructions (Signed)
Social Worker Visit Information  Goals we discussed today:  Goals Addressed            This Visit's Progress   . COMPLETED: Work with SW to manage care coordination needs       Timeframe:  Long-Range Goal Priority:  [employer] Start Date:   11.17.21                          Expected End Date: 4.6.22                      Goal Met 4.27.22  Patient Goals/Self-Care Activities With the help of patient caregiver Leilani Cespedes . Patient verbalizes understanding of plan to contact SW as needed  . Self administers medications as prescribed . Attends all scheduled provider appointments . Calls pharmacy for medication refills . Calls provider office for new concerns or questions        Follow Up Plan: No Social Work follow up planned at this time. Please contact me as needed with future care coordination needs.   Daneen Schick, BSW, CDP Social Worker, Certified Dementia Practitioner [place] [facility] [place] [facility] [phone]

## 2021-03-21 ENCOUNTER — Other Ambulatory Visit: Payer: Self-pay | Admitting: Nurse Practitioner

## 2021-03-21 ENCOUNTER — Other Ambulatory Visit: Payer: Self-pay | Admitting: Neurology

## 2021-03-21 DIAGNOSIS — I10 Essential (primary) hypertension: Secondary | ICD-10-CM

## 2021-03-21 DIAGNOSIS — K219 Gastro-esophageal reflux disease without esophagitis: Secondary | ICD-10-CM

## 2021-03-21 DIAGNOSIS — F32A Depression, unspecified: Secondary | ICD-10-CM

## 2021-03-21 DIAGNOSIS — F0391 Unspecified dementia with behavioral disturbance: Secondary | ICD-10-CM

## 2021-04-01 ENCOUNTER — Other Ambulatory Visit: Payer: Self-pay

## 2021-04-01 ENCOUNTER — Ambulatory Visit (INDEPENDENT_AMBULATORY_CARE_PROVIDER_SITE_OTHER): Payer: Medicare Other | Admitting: Nurse Practitioner

## 2021-04-01 ENCOUNTER — Encounter: Payer: Self-pay | Admitting: Nurse Practitioner

## 2021-04-01 VITALS — BP 118/60 | HR 83 | Temp 98.2°F | Ht 61.2 in | Wt 152.0 lb

## 2021-04-01 DIAGNOSIS — Z23 Encounter for immunization: Secondary | ICD-10-CM

## 2021-04-01 DIAGNOSIS — W19XXXA Unspecified fall, initial encounter: Secondary | ICD-10-CM

## 2021-04-01 DIAGNOSIS — F0390 Unspecified dementia without behavioral disturbance: Secondary | ICD-10-CM

## 2021-04-01 DIAGNOSIS — F32A Depression, unspecified: Secondary | ICD-10-CM | POA: Diagnosis not present

## 2021-04-01 DIAGNOSIS — E2839 Other primary ovarian failure: Secondary | ICD-10-CM

## 2021-04-01 DIAGNOSIS — I10 Essential (primary) hypertension: Secondary | ICD-10-CM

## 2021-04-01 MED ORDER — TETANUS-DIPHTH-ACELL PERTUSSIS 5-2.5-18.5 LF-MCG/0.5 IM SUSP: 0.5000 mL | Freq: Once | INTRAMUSCULAR | 0 refills | Status: AC

## 2021-04-01 MED ORDER — PNEUMOCOCCAL 13-VAL CONJ VACC IM SUSP: 0.5000 mL | INTRAMUSCULAR | 0 refills | Status: AC

## 2021-04-01 NOTE — Patient Instructions (Signed)

## 2021-04-01 NOTE — Progress Notes (Signed)
I,Yamilka Roman [employer] as a Education administrator for [employer], FNP.,have documented all relevant documentation on the behalf of [name], FNP,as directed by  [name], FNP while in the presence of [name], [place].  This visit occurred during the SARS-CoV-2 public health emergency.  Safety protocols were in place, including screening questions prior to the visit, additional usage of staff PPE, and extensive cleaning of exam room while observing appropriate contact time as indicated for disinfecting solutions.  Subjective:     Patient ID: Patricia Simmons , female    DOB: 12/31/1935 , 85 y.o.   MRN: [id number]   Chief Complaint  Patient presents with  . Hypertension    HPI  Patient presents today for a blood pressure f/u.  Her daughter reports she has been sleeping more. She has fallen out of the bed since her last visit without any injuries.  She has been going to church on Sundays most times.     Hypertension This is a chronic problem. The current episode started more than 1 year ago. The problem is unchanged. The problem is controlled. Pertinent negatives include no anxiety, blurred vision, chest pain, headaches, palpitations or shortness of breath. There are no associated agents to hypertension. Risk factors for coronary artery disease include sedentary lifestyle. Past treatments include angiotensin blockers. There are no compliance problems.  There is no history of angina. There is no history of chronic renal disease.     Past Medical History:  Diagnosis Date  . Arthritis   . Blood transfusion without reported diagnosis   . Hypertension      Family History  Problem Relation Age of Onset  . Stroke Mother   . Hypertension Mother   . Prostate cancer Father   . Hypertension Sister   . Hyperlipidemia Sister   . Emphysema Brother   . Lung cancer Brother   . Dementia Daughter      Current Outpatient Medications:  .  acetaminophen (TYLENOL) 500 MG tablet, Take 1,000 mg by  mouth every 6 (six) hours as needed for mild pain or headache., Disp: , Rfl:  .  citalopram (CELEXA) 10 MG tablet, Take 1 tablet by mouth once daily, Disp: 30 tablet, Rfl: 0 .  diclofenac sodium (VOLTAREN) 1 % GEL, Apply 2 g topically 4 (four) times daily., Disp: 1 Tube, Rfl: 2 .  donepezil (ARICEPT) 10 MG tablet, Take 1 tablet (10 mg total) by mouth at bedtime., Disp: 90 tablet, Rfl: 6 .  hydrochlorothiazide (HYDRODIURIL) 12.5 MG tablet, Take 1 tablet (12.5 mg total) by mouth daily., Disp: 90 tablet, Rfl: 1 .  ipratropium (ATROVENT) 0.03 % nasal spray, Place 2 sprays into both nostrils every 12 (twelve) hours., Disp: 30 mL, Rfl: 12 .  losartan (COZAAR) 50 MG tablet, Take 1 tablet by mouth once daily, Disp: 90 tablet, Rfl: 0 .  magic mouthwash w/lidocaine SOLN, Take 5 mLs by mouth 3 (three) times daily as needed for mouth pain., Disp: 120 mL, Rfl: 0 .  memantine (NAMENDA) 10 MG tablet, START TAKING 1 TABLET BY MOUTH AT BEDTIME 2 WEEKS LATER IF NO SIDE EFFECTS INCREASE TO 1 TABLET TWICE DAILY, Disp: 60 tablet, Rfl: 0 .  omeprazole (PRILOSEC) 40 MG capsule, Take 1 capsule by mouth once daily, Disp: 90 capsule, Rfl: 0 .  pneumococcal 13-valent conjugate vaccine (PREVNAR 13) SUSP injection, Inject 0.5 mLs into the muscle tomorrow at 10 am for 1 dose., Disp: 0.5 mL, Rfl: 0 .  Potassium Chloride ER 20 MEQ TBCR, Take  1 tablet by mouth once daily, Disp: 90 tablet, Rfl: 0 .  QUEtiapine (SEROQUEL) 25 MG tablet, Take 1 tablet by mouth once daily, Disp: 30 tablet, Rfl: 0 .  Tdap (BOOSTRIX) 5-2.5-18.5 LF-MCG/0.5 injection, Inject 0.5 mLs into the muscle once for 1 dose., Disp: 0.5 mL, Rfl: 0 .  triamcinolone cream (KENALOG) 0.1 %, Apply 1 application topically 2 (two) times daily., Disp: 30 g, Rfl: 0   Allergies  Allergen Reactions  . Codeine     unknown     Review of Systems  Constitutional: Negative.   Eyes: Negative for blurred vision.  Respiratory: Negative for shortness of breath.   Cardiovascular:  Negative for chest pain, palpitations and leg swelling.  Musculoskeletal: Positive for arthralgias (right hip pain).  Neurological: Negative for dizziness and headaches.  Psychiatric/Behavioral: Negative.      Today's Vitals   04/01/21 1053  BP: 118/60  Pulse: 83  Temp: 98.2 F (36.8 C)  Weight: 152 lb (68.9 kg)  Height: 5' 1.2" (1.554 m)  PainSc: 0-No pain   Body mass index is 28.53 kg/m.   Objective:  Physical Exam Vitals reviewed.  Constitutional:      General: She is not in acute distress.    Appearance: Normal appearance. She is obese.  Cardiovascular:     Rate and Rhythm: Normal rate and regular rhythm.     Pulses: Normal pulses.     Heart sounds: Normal heart sounds. No murmur heard.   Pulmonary:     Effort: Pulmonary effort is normal. No respiratory distress.     Breath sounds: Normal breath sounds. No wheezing.  Musculoskeletal:        General: Tenderness (right hip) present.  Skin:    Capillary Refill: Capillary refill takes less than 2 seconds.  Neurological:     General: No focal deficit present.     Mental Status: She is alert and oriented to person, place, and time.     Cranial Nerves: No cranial nerve deficit.     Motor: No weakness.  Psychiatric:        Attention and Perception: She is inattentive.        Mood and Affect: Mood normal. Affect is flat.        Behavior: Behavior normal.        Thought Content: Thought content normal.        Cognition and Memory: Cognition is impaired. Memory is impaired.        Judgment: Judgment normal. Judgment is not impulsive.         Assessment And Plan:     1. Essential hypertension . B/P is well controlled.  . CMP ordered to check renal function.  . The importance of regular exercise and dietary modification was stressed to the patient.  - CMP14+EGFR - CBC  2. Dementia without behavioral disturbance, unspecified dementia type ([place])  Chronic, she is stable at this time  3. Depression, unspecified  depression type  Chronic, good control  Continue with current medications.   4. Fall, initial encounter  Had a fall out of the bed without injuries  Offered physical therapy however daughter declines at this time  5. Decreased estrogen level - DG Bone Density; Future  6. Encounter for immunization  I have sent the Rx for pneumonia and tdap to the pharmacy - pneumococcal 13-valent conjugate vaccine (PREVNAR 13) SUSP injection; Inject 0.5 mLs into the muscle tomorrow at 10 am for 1 dose.  Dispense: 0.5 mL; Refill: 0 - Tdap (  BOOSTRIX) 5-2.5-18.5 LF-MCG/0.5 injection; Inject 0.5 mLs into the muscle once for 1 dose.  Dispense: 0.5 mL; Refill: 0     Patient was given opportunity to ask questions. Patient verbalized understanding of the plan and was able to repeat key elements of the plan. All questions were answered to their satisfaction.  [name], FNP   I, [name], FNP, have reviewed all documentation for this visit. The documentation on 04/01/21 for the exam, diagnosis, procedures, and orders are all accurate and complete.   IF YOU HAVE BEEN REFERRED TO A SPECIALIST, IT MAY TAKE 1-2 WEEKS TO SCHEDULE/PROCESS THE REFERRAL. IF YOU HAVE NOT HEARD FROM US/SPECIALIST IN TWO WEEKS, PLEASE GIVE [place] A CALL AT [phone].   THE PATIENT IS ENCOURAGED TO PRACTICE SOCIAL DISTANCING DUE TO THE COVID-19 PANDEMIC.

## 2021-04-02 ENCOUNTER — Other Ambulatory Visit: Payer: Self-pay | Admitting: Nurse Practitioner

## 2021-04-02 ENCOUNTER — Telehealth: Payer: Medicare Other

## 2021-04-02 ENCOUNTER — Ambulatory Visit (INDEPENDENT_AMBULATORY_CARE_PROVIDER_SITE_OTHER): Payer: Medicare Other

## 2021-04-02 DIAGNOSIS — E876 Hypokalemia: Secondary | ICD-10-CM

## 2021-04-02 DIAGNOSIS — F0390 Unspecified dementia without behavioral disturbance: Secondary | ICD-10-CM | POA: Diagnosis not present

## 2021-04-02 DIAGNOSIS — I10 Essential (primary) hypertension: Secondary | ICD-10-CM

## 2021-04-02 LAB — CMP14+EGFR
ALT: 6 IU/L (ref 0–32)
AST: 11 IU/L (ref 0–40)
Albumin/Globulin Ratio: 1.8 (ref 1.2–2.2)
Albumin: 4.3 g/dL (ref 3.6–4.6)
Alkaline Phosphatase: 84 IU/L (ref 44–121)
BUN/Creatinine Ratio: 5 — ABNORMAL LOW (ref 12–28)
BUN: 5 mg/dL — ABNORMAL LOW (ref 8–27)
Bilirubin Total: 0.6 mg/dL (ref 0.0–1.2)
CO2: 27 mmol/L (ref 20–29)
Calcium: 9.7 mg/dL (ref 8.7–10.3)
Chloride: 99 mmol/L (ref 96–106)
Creatinine, Ser: 0.93 mg/dL (ref 0.57–1.00)
Globulin, Total: 2.4 g/dL (ref 1.5–4.5)
Glucose: 83 mg/dL (ref 65–99)
Potassium: 3.3 mmol/L — ABNORMAL LOW (ref 3.5–5.2)
Sodium: 140 mmol/L (ref 134–144)
Total Protein: 6.7 g/dL (ref 6.0–8.5)
eGFR: 61 mL/min/{1.73_m2} (ref >59)

## 2021-04-02 LAB — CBC
Hematocrit: 38.8 % (ref 34.0–46.6)
Hemoglobin: 12.9 g/dL (ref 11.1–15.9)
MCH: 30.3 pg (ref 26.6–33.0)
MCHC: 33.2 g/dL (ref 31.5–35.7)
MCV: 91 fL (ref 79–97)
Platelets: 215 10*3/uL (ref 150–450)
RBC: 4.26 x10E6/uL (ref 3.77–5.28)
RDW: 12.2 % (ref 11.7–15.4)
WBC: 3.9 10*3/uL (ref 3.4–10.8)

## 2021-04-02 MED ORDER — POTASSIUM CHLORIDE ER 20 MEQ PO TBCR: 1.0000 | EXTENDED_RELEASE_TABLET | Freq: Every day | ORAL | 1 refills | Status: DC

## 2021-04-03 NOTE — Chronic Care Management (AMB) (Addendum)
Chronic Care Management   CCM RN Visit Note  04/02/2021 Name: Patricia Simmons MRN: [id number] DOB: October 10, 1936  Subjective: Patricia Simmons is a 85 y.o. year old female who is a primary care patient of Arnette Felts, FNP. The care management team was consulted for assistance with disease management and care coordination needs.    Engaged with patient by telephone for follow up visit in response to provider referral for case management and/or care coordination services.   Consent to Services:  The patient was given information about Chronic Care Management services, agreed to services, and gave verbal consent prior to initiation of services.  Please see initial visit note for detailed documentation.   Patient agreed to services and verbal consent obtained.   Assessment: Review of patient past medical history, allergies, medications, health status, including review of consultants reports, laboratory and other test data, was performed as part of comprehensive evaluation and provision of chronic care management services.   SDOH (Social Determinants of Health) assessments and interventions performed: Yes, sent in basket message to embedded BSW regarding daughter's request for assistance with housing   CCM Care Plan  Allergies  Allergen Reactions  . Codeine     unknown    Outpatient Encounter Medications as of 04/02/2021  Medication Sig  . acetaminophen (TYLENOL) 500 MG tablet Take 1,000 mg by mouth every 6 (six) hours as needed for mild pain or headache.  . citalopram (CELEXA) 10 MG tablet Take 1 tablet by mouth once daily  . diclofenac sodium (VOLTAREN) 1 % GEL Apply 2 g topically 4 (four) times daily.  [name] Kitchen donepezil (ARICEPT) 10 MG tablet Take 1 tablet (10 mg total) by mouth at bedtime.  . hydrochlorothiazide (HYDRODIURIL) 12.5 MG tablet Take 1 tablet (12.5 mg total) by mouth daily.  [name] Kitchen ipratropium (ATROVENT) 0.03 % nasal spray Place 2 sprays into both nostrils every 12 (twelve) hours.  [name] Kitchen  losartan (COZAAR) 50 MG tablet Take 1 tablet by mouth once daily  . magic mouthwash w/lidocaine SOLN Take 5 mLs by mouth 3 (three) times daily as needed for mouth pain.  . memantine (NAMENDA) 10 MG tablet START TAKING 1 TABLET BY MOUTH AT BEDTIME 2 WEEKS LATER IF NO SIDE EFFECTS INCREASE TO 1 TABLET TWICE DAILY  . omeprazole (PRILOSEC) 40 MG capsule Take 1 capsule by mouth once daily  . Potassium Chloride ER 20 MEQ TBCR Take 1 tablet by mouth daily.  . QUEtiapine (SEROQUEL) 25 MG tablet Take 1 tablet by mouth once daily  . triamcinolone cream (KENALOG) 0.1 % Apply 1 application topically 2 (two) times daily.   No facility-administered encounter medications on file as of 04/02/2021.    Patient Active Problem List   Diagnosis Date Noted  . Lower abdominal pain 07/29/2020  . Increased frequency of urination 06/11/2019  . Hallucination 06/11/2019  . Left hip pain 04/03/2019  . Fall 04/03/2019  . Keloid 12/21/2018  . Dementia without behavioral disturbance ([facility]) 09/08/2018  . Depression 09/08/2018  . Hypokalemia 07/30/2018  . Chest pain 07/30/2018  . GERD (gastroesophageal reflux disease) 07/30/2018  . Essential hypertension 07/30/2018    Conditions to be addressed/monitored:HTN, Dementia, Hypokalemia  Care Plan : Hypokalemia  Updates made by Riley Churches, RN since 04/03/2021 12:00 AM    Problem: Hypokalemia   Priority: High    Long-Range Goal: Hypokalemia - complications minimized or prevented   Start Date: 04/02/2021  Expected End Date: 04/02/2022  This Visit's Progress: On track  Priority: High  Note:  Current Barriers:   Ineffective Self Health Maintenance   Dementia  Clinical Goal(s):  [name] Kitchen Collaboration with Arnette Felts, FNP regarding development and update of comprehensive plan of care as evidenced by provider attestation and co-signature . Inter-disciplinary care team collaboration (see longitudinal plan of care)  patient will work with care management team to  address care coordination and chronic disease management needs related to Disease Management  Educational Needs  Care Coordination  Medication Management and Education  Psychosocial Support  Dementia and Caregiver Support   Interventions:  04/02/21 completed successful outbound call with daughter Patricia Simmons  Evaluation of current treatment plan related to  HTN, Dementia, Hypokalemia , self-management and patient's adherence to plan as established by provider.  Collaboration with Arnette Felts, FNP regarding development and update of comprehensive plan of care as evidenced by provider attestation       and co-signature  Inter-disciplinary care team collaboration (see longitudinal plan of care) . Provided education to patient about basic disease process related to Hypokalemia  . Review of patient status, including review of consultant's reports, relevant laboratory and other test results, and medications completed. . Reviewed medications with patient and discussed importance of medication adherence . Educated on dietary recommendations . Mailed printed educational materials related to Potassium Rich Foods; Elderly Nutrition 101   Discussed plans with patient for ongoing care management follow up and provided patient with direct contact information for care management team Self Care Activities:  . Continue to keep all scheduled follow up appointments . Take medications as directed  . Let your healthcare team know if you are unable to take your medications . Call your pharmacy for refills at least 7 days prior to running out of medication . Add potassium rich foods to diet  Patient Goals: -  increase Potassium level   Follow Up Plan: Telephone follow up appointment with care management team member scheduled for: 07/24/21    Care Plan : Dementia (Adult)  Updates made by Riley Churches, RN since 04/03/2021 12:00 AM    Problem: Cognitive Function   Priority: High    Long-Range Goal:  Optimal Cognitive Function   Start Date: 04/02/2021  Expected End Date: 04/02/2022  This Visit's Progress: On track  Priority: High  Note:   Current Barriers:   Ineffective Self Health Maintenance   Dementia  Clinical Goal(s):  [name] Kitchen Collaboration with Arnette Felts, FNP regarding development and update of comprehensive plan of care as evidenced by provider attestation and co-signature . Inter-disciplinary care team collaboration (see longitudinal plan of care)  patient will work with care management team to address care coordination and chronic disease management needs related to Disease Management  Educational Needs  Care Coordination  Medication Management and Education  Psychosocial Support  Dementia and Caregiver Support   Interventions:   Evaluation of current treatment plan related to  HTN, Dementia, Hypokalemia , self-management and patient's adherence to plan as established by provider.  Collaboration with Arnette Felts, FNP regarding development and update of comprehensive plan of care as evidenced by provider attestation       and co-signature  Inter-disciplinary care team collaboration (see longitudinal plan of care)  Determined patient's cognition is stable, per daughter Patricia Simmons patient is unable to dress and bathe herself, she helps with light housekeeping chores such as folding laundry and is able to participate in family activities such as traveling with daughter to run errands, etc.  Encouraged ongoing social relationships and physical activity based on ability  Promoted strategies that will  preserve patient's abilities; focus on strengths and quality of life optimization   Educated daughter on medication side effects such as anorexia, weight loss, syncope, bradycardia, falls    Discussed plans with patient for ongoing care management follow up and provided patient with direct contact information for care management team Self Care Activities:  . Continue to keep  all scheduled follow up appointments . Take medications as directed  . Let your healthcare team know if you are unable to take your medications . Call your pharmacy for refills at least 7 days prior to running out of medication Patient Goals: - keep cognition stable   Follow Up Plan: Telephone follow up appointment with care management team member scheduled for: 07/24/21     Plan:Telephone follow up appointment with care management team member scheduled for:  07/24/21  Delsa Sale, RN, BSN, CCM Care Management Coordinator [facility] Care Management/[facility]  Direct Phone: [phone]

## 2021-04-03 NOTE — Patient Instructions (Signed)
Goals Addressed    . COMPLETED: "to have her stomach pain checked out"       Daughter stated  CARE PLAN ENTRY (see longitudinal plan of care for additional care plan information)  Current Barriers:  [name] Kitchen Knowledge Deficits related to evaluation and treatment of acute GI pain  . Chronic Disease Management support and education needs related to HTN, Dementia, Hypokalemia . Cognitive Deficits Nurse Case Manager Clinical Goal(s):  [name] Kitchen Over the next 45 days, patient will work with the GI Specialist  to address needs related to evaluation and treatment of acute GI pain  CCM RN CM Interventions:  04/02/21 call completed with daughter [name]  . Inter-disciplinary care team collaboration (see longitudinal plan of care) . Evaluation of current treatment plan related to acute GI pain and patient's adherence to plan as established by provider . Determined patient is no longer experiencing abdominal pain and daughter voices having no other concerns at this time . Discussed plans with patient for ongoing care management follow up and provided patient with direct contact information for care management team Patient Self Care Activities:  . Supportive daughter to meet care needs . Unable to independently perform Self Care   Initial goal documentation     . COMPLETED: "to keep her dementia under good control"       CARE PLAN ENTRY (see longitudinal plan of care for additional care plan information)  Current Barriers:  [name] Kitchen Knowledge Deficits related to disease process and Self Health management of Dementia . Chronic Disease Management support and education needs related to HTN, Dementia, Hypokalemia . Cognitive Deficits  Nurse Case Manager Clinical Goal(s):  [name] Kitchen Over the next 180 days, patient will work with the CCM team and PCP to address needs related to disease education and support for improved Self Health management of Dementia   CCM RN CM Interventions:  07/30/20 call completed with daughter Remmington Teters  . Inter-disciplinary care team collaboration (see longitudinal plan of care) . Evaluation of current treatment plan related to Dementia and patient's adherence to plan as established by provider. . Provided education to patient re: disease process and stages of Dementia, including what to expect and when to call the doctor for changes in patients condition when needed  . Reviewed medications with patient and discussed daughter is administering patient's medications and patient is cooperative with taking meds; Current regimen as prescribed for Dementia include; Aricept 10 mg q hs, Namenda 10 mg bid, patient is taking Seroquel 25 mg q am and 50 mg prn at bedtime  . Discussed plans with patient for ongoing care management follow up and provided patient with direct contact information for care management team . Provided patient with printed educational materials related to What is Dementia?  Patient Self Care Activities:  . Supportive daughter to assist with care needs . Unable to independently perform Self Care  Initial goal documentation  04/02/21 Please see new [employer] Care plan for updated goal.     . Dementia - optimal cognition function   On track    Timeframe:  Long-Range Goal Priority:  High Start Date:  04/02/21                           Expected End Date:  04/02/22    Next Scheduled Follow up date: 07/24/21    Self Care Activities:  . Continue to keep all scheduled follow up appointments . Take medications as directed  . Let your healthcare  team know if you are unable to take your medications . Call your pharmacy for refills at least 7 days prior to running out of medication Patient Goals: - keep cognition stable                       . Hypokalemia - complications minimized or prevented   On track    Timeframe:  Long-Range Goal Priority:  High Start Date:  04/02/21                          Expected End Date:  04/02/22    Next Scheduled Follow up date: 07/24/21      Self Care Activities:  . Continue to keep all scheduled follow up appointments . Take medications as directed  . Let your healthcare team know if you are unable to take your medications . Call your pharmacy for refills at least 7 days prior to running out of medication . Add potassium rich foods to diet  Patient Goals: -  increase Potassium level

## 2021-04-09 NOTE — Progress Notes (Signed)
Noted, thanks!

## 2021-04-15 ENCOUNTER — Ambulatory Visit (INDEPENDENT_AMBULATORY_CARE_PROVIDER_SITE_OTHER): Payer: Medicare Other

## 2021-04-15 DIAGNOSIS — F0390 Unspecified dementia without behavioral disturbance: Secondary | ICD-10-CM

## 2021-04-15 DIAGNOSIS — I10 Essential (primary) hypertension: Secondary | ICD-10-CM | POA: Diagnosis not present

## 2021-04-15 NOTE — Chronic Care Management (AMB) (Signed)
Chronic Care Management    Social Work Note  04/15/2021 Name: Patricia Simmons MRN: [id number] DOB: 1936-03-17  Patricia Simmons is a 85 y.o. year old female who is a primary care patient of Patricia Felts, FNP. The CCM team was consulted to assist the patient with chronic disease management and/or care coordination needs related to: [employer] .   Engaged with patients daughter by phone for follow up visit in response to provider referral for social work chronic care management and care coordination services.   Consent to Services:  The patient was given information about Chronic Care Management services, agreed to services, and gave verbal consent prior to initiation of services.  Please see initial visit note for detailed documentation.   Patient agreed to services and consent obtained.   Assessment: Review of patient past medical history, allergies, medications, and health status, including review of relevant consultants reports was performed today as part of a comprehensive evaluation and provision of chronic care management and care coordination services.     SDOH (Social Determinants of Health) assessments and interventions performed:    Advanced Directives Status: Not addressed in this encounter.  CCM Care Plan  Allergies  Allergen Reactions  . Codeine     unknown    Outpatient Encounter Medications as of 04/15/2021  Medication Sig  . acetaminophen (TYLENOL) 500 MG tablet Take 1,000 mg by mouth every 6 (six) hours as needed for mild pain or headache.  . citalopram (CELEXA) 10 MG tablet Take 1 tablet by mouth once daily  . diclofenac sodium (VOLTAREN) 1 % GEL Apply 2 g topically 4 (four) times daily.  [name] Kitchen donepezil (ARICEPT) 10 MG tablet Take 1 tablet (10 mg total) by mouth at bedtime.  . hydrochlorothiazide (HYDRODIURIL) 12.5 MG tablet Take 1 tablet (12.5 mg total) by mouth daily.  [name] Kitchen ipratropium (ATROVENT) 0.03 % nasal spray Place 2 sprays into both nostrils every 12  (twelve) hours.  [name] Kitchen losartan (COZAAR) 50 MG tablet Take 1 tablet by mouth once daily  . magic mouthwash w/lidocaine SOLN Take 5 mLs by mouth 3 (three) times daily as needed for mouth pain.  . memantine (NAMENDA) 10 MG tablet START TAKING 1 TABLET BY MOUTH AT BEDTIME 2 WEEKS LATER IF NO SIDE EFFECTS INCREASE TO 1 TABLET TWICE DAILY  . omeprazole (PRILOSEC) 40 MG capsule Take 1 capsule by mouth once daily  . Potassium Chloride ER 20 MEQ TBCR Take 1 tablet by mouth daily.  . QUEtiapine (SEROQUEL) 25 MG tablet Take 1 tablet by mouth once daily  . triamcinolone cream (KENALOG) 0.1 % Apply 1 application topically 2 (two) times daily.   No facility-administered encounter medications on file as of 04/15/2021.    Patient Active Problem List   Diagnosis Date Noted  . Lower abdominal pain 07/29/2020  . Increased frequency of urination 06/11/2019  . Hallucination 06/11/2019  . Left hip pain 04/03/2019  . Fall 04/03/2019  . Keloid 12/21/2018  . Dementia without behavioral disturbance (HCC) 09/08/2018  . Depression 09/08/2018  . Hypokalemia 07/30/2018  . Chest pain 07/30/2018  . GERD (gastroesophageal reflux disease) 07/30/2018  . Essential hypertension 07/30/2018    Conditions to be addressed/monitored: HTN and Dementia; Housing barriers  Care Plan : Social Work [facility] Care Plan  Updates made by Patricia Simmons since 04/15/2021 12:00 AM    Problem: Quality of Life (General Plan of Care)     Long-Range Goal: Quality of Life Maintained   Start Date: 04/15/2021  Expected End Date: 08/13/2021  Priority: Medium  Note:   Current Barriers:  . Chronic disease management support and education needs related to HTN and Dementia  . Limited knowledge of housing resources to  Child psychotherapist Clinical Goal(s):  . patient will work with SW to identify and address any acute and/or chronic care coordination needs related to the self health management of HTN and Dementia  . Patient and her daughter will follow up  with Patricia Simmons as directed by SW  SW Interventions:  . Inter-disciplinary care team collaboration (see longitudinal plan of care) . Collaboration with Patricia Felts, FNP regarding development and update of comprehensive plan of care as evidenced by provider attestation and co-signature . Collaboration with RN Care Manager Patricia Simmons who requests SW outreach to assist with housing resource options . Successful outbound call placed to the patients daughter and caregiver Patricia Simmons . Determined Patricia Simmons is interested in being referred to a program that a few of her neighbors have accessed that assists with covering half the cost of rent. Patricia Simmons indicates she is a caregiver to both the patient and her 83 year old disabled grand-daughter  . Educated Patricia Simmons on the [employer] offered by Patricia Simmons . Discussed Patricia Simmons has worked with the housing coalition in the past when her past home had carbon monoxide poisoning . Referral placed to Patricia Simmons, Manager of the Homeless/Rental Counseling Team with   Patricia Simmons to assess patient eligibility . Received a response from Mrs. Patricia Simmons the patients referral was received and would be assigned to a housing counselor for outreach . Determined Patricia Simmons is in need of caregiver resources for her grand-daughter- provided brief education on CAP/C program along with contact number for her to self-refer . SW will follow up with the patient over the next month  Patient Goals/Self-Care Activities . patient will: with the help of her daughter  -  Engage with Patricia Simmons  -Contact SW as needed prior to next scheduled call  Follow Up Plan:  SW will follow up with the patient over the next month       Follow Up Plan: SW will follow up with patient by phone over the next month      Patricia Simmons, BSW, CDP Social Worker, Certified Dementia Practitioner [facility] /  [facility] Care Management [phone]  Total time spent performing care coordination and/or care management activities with the patient by phone or face to face = 50 minutes.

## 2021-04-15 NOTE — Patient Instructions (Signed)
Social Worker Visit Information  Goals we discussed today:  Goals Addressed            This Visit's Progress   . Quality of Life Maintained       Timeframe:  Long-Range Goal Priority:  Medium Start Date:  6.8.22                           Expected End Date:   10.6.22                    Next planned outreach: 7.6.22  Patient Goals/Self-Care Activities . patient will: with the help of her daughter  - Engage with [employer]  -Solicitor SW as needed prior to next scheduled call        Materials Provided: Verbal education about housing resources provided by phone  Follow Up Plan: SW will follow up with patient by phone over the next month   Bevelyn Ngo, BSW, CDP Social Worker, Certified Dementia Practitioner [facility] / [facility] Care Management [phone]

## 2021-05-02 ENCOUNTER — Other Ambulatory Visit: Payer: Self-pay | Admitting: Neurology

## 2021-05-02 ENCOUNTER — Other Ambulatory Visit: Payer: Self-pay | Admitting: Nurse Practitioner

## 2021-05-02 DIAGNOSIS — K219 Gastro-esophageal reflux disease without esophagitis: Secondary | ICD-10-CM

## 2021-05-02 DIAGNOSIS — F32A Depression, unspecified: Secondary | ICD-10-CM

## 2021-05-02 DIAGNOSIS — F0391 Unspecified dementia with behavioral disturbance: Secondary | ICD-10-CM

## 2021-05-02 DIAGNOSIS — F0390 Unspecified dementia without behavioral disturbance: Secondary | ICD-10-CM

## 2021-05-02 DIAGNOSIS — I10 Essential (primary) hypertension: Secondary | ICD-10-CM

## 2021-05-13 ENCOUNTER — Ambulatory Visit (INDEPENDENT_AMBULATORY_CARE_PROVIDER_SITE_OTHER): Payer: Medicare Other

## 2021-05-13 DIAGNOSIS — F0390 Unspecified dementia without behavioral disturbance: Secondary | ICD-10-CM

## 2021-05-13 DIAGNOSIS — I10 Essential (primary) hypertension: Secondary | ICD-10-CM

## 2021-05-13 NOTE — Chronic Care Management (AMB) (Signed)
Chronic Care Management    Social Work Note  05/13/2021 Name: Patricia Simmons MRN: [id number] DOB: 1935/12/07  Patricia Simmons is a 85 y.o. year old female who is a primary care patient of Arnette Felts, FNP. The CCM team was consulted to assist the patient with chronic disease management and/or care coordination needs related to: [employer] .   Engaged with patient daughter by phone  for follow up visit in response to provider referral for social work chronic care management and care coordination services.   Consent to Services:  The patient was given information about Chronic Care Management services, agreed to services, and gave verbal consent prior to initiation of services.  Please see initial visit note for detailed documentation.   Patient agreed to services and consent obtained.   Assessment: Review of patient past medical history, allergies, medications, and health status, including review of relevant consultants reports was performed today as part of a comprehensive evaluation and provision of chronic care management and care coordination services.     SDOH (Social Determinants of Health) assessments and interventions performed:    Advanced Directives Status: Not addressed in this encounter.  CCM Care Plan  Allergies  Allergen Reactions   Codeine     unknown    Outpatient Encounter Medications as of 05/13/2021  Medication Sig   acetaminophen (TYLENOL) 500 MG tablet Take 1,000 mg by mouth every 6 (six) hours as needed for mild pain or headache.   citalopram (CELEXA) 10 MG tablet Take 1 tablet by mouth once daily   diclofenac sodium (VOLTAREN) 1 % GEL Apply 2 g topically 4 (four) times daily.   donepezil (ARICEPT) 10 MG tablet Take 1 tablet (10 mg total) by mouth at bedtime. Must be seen for further refills. Call [phone].   hydrochlorothiazide (HYDRODIURIL) 12.5 MG tablet Take 1 tablet (12.5 mg total) by mouth daily.   ipratropium (ATROVENT) 0.03 % nasal spray  Place 2 sprays into both nostrils every 12 (twelve) hours.   losartan (COZAAR) 50 MG tablet Take 1 tablet by mouth once daily   magic mouthwash w/lidocaine SOLN Take 5 mLs by mouth 3 (three) times daily as needed for mouth pain.   memantine (NAMENDA) 10 MG tablet START TAKING 1 TABLET BY MOUTH AT BEDTIME 2 WEEKS LATER IF NO SIDE EFFECTS INCREASE TO 1 TABLET TWICE DAILY   omeprazole (PRILOSEC) 40 MG capsule Take 1 capsule by mouth once daily   Potassium Chloride ER 20 MEQ TBCR Take 1 tablet by mouth daily.   QUEtiapine (SEROQUEL) 25 MG tablet Take 1 tablet by mouth once daily   triamcinolone cream (KENALOG) 0.1 % Apply 1 application topically 2 (two) times daily.   No facility-administered encounter medications on file as of 05/13/2021.    Patient Active Problem List   Diagnosis Date Noted   Lower abdominal pain 07/29/2020   Increased frequency of urination 06/11/2019   Hallucination 06/11/2019   Left hip pain 04/03/2019   Fall 04/03/2019   Keloid 12/21/2018   Dementia without behavioral disturbance (HCC) 09/08/2018   Depression 09/08/2018   Hypokalemia 07/30/2018   Chest pain 07/30/2018   GERD (gastroesophageal reflux disease) 07/30/2018   Essential hypertension 07/30/2018    Conditions to be addressed/monitored: HTN and Dementia  Care Plan : Social Work [facility] Care Plan  Updates made by Bevelyn Ngo since 05/13/2021 12:00 AM     Problem: Quality of Life (General Plan of Care)      Long-Range Goal: Quality of Life Maintained  Start Date: 04/15/2021  Expected End Date: 08/13/2021  This Visit's Progress: On track  Priority: Medium  Note:   Current Barriers:  Chronic disease management support and education needs related to HTN and Dementia  Limited knowledge of housing resources to  Child psychotherapist Clinical Goal(s):  patient will work with SW to identify and address any acute and/or chronic care coordination needs related to the self health management of HTN and Dementia   Patient and her daughter will follow up with [employer] as directed by [employer]  SW Interventions:  Inter-disciplinary care team collaboration (see longitudinal plan of care) Collaboration with Arnette Felts, FNP regarding development and update of comprehensive plan of care as evidenced by provider attestation and co-signature Successful outbound call placed to the patients daughter and caregiver Patricia Simmons to assess goal progression Confirmed Mrs. Manthe has been in contact with [employer] to complete application for [employer] Discussed since completion of application, Mrs. Otoole had to purchase a larger SUV which has increased her monthly car payment to $800 per month - [name] requests SW contact [name] with the [facility] to update income Water quality scientist with [name] via secure e-mail correspondence to advise of increased expense due to monthly car payment Assessed for care coordination needs - no acute challenges identified at this time Discussed plan for SW to follow up on a long term basis while patient remains engaged with RN Care Manager to address care management needs Scheduled follow up call over the next 90 days  Patient Goals/Self-Care Activities patient will: with the help of her daughter  -  Engage with [employer]  -Contact SW as needed prior to next scheduled call  Follow Up Plan:  SW will follow up with the patient over the next 90 days       Follow Up Plan: SW will follow up with patient by phone over the next 90 days      Bevelyn Ngo, BSW, CDP Social Worker, Certified Dementia Practitioner [facility] / [facility] Care Management [phone]

## 2021-05-13 NOTE — Patient Instructions (Signed)
Social Worker Visit Information  Goals we discussed today:   Goals Addressed             This Visit's Progress    Quality of Life Maintained       Timeframe:  Long-Range Goal Priority:  Medium Start Date:  6.8.22                           Expected End Date:   10.6.22                    Next planned outreach: 10.6.22  Patient Goals/Self-Care Activities patient will: with the help of her daughter  - Engage with [employer]  -Solicitor SW as needed prior to next scheduled call          Follow Up Plan: SW will follow up with patient by phone over the next 90 days  Bevelyn Ngo, BSW, CDP Social Worker, Certified Dementia Practitioner [facility] / [facility] Care Management [phone]

## 2021-05-15 IMAGING — CT CT HEAD W/O CM
1 series · 16 of 30 positions shown, 20 images · non-contrast
Comparison: 01/19/2019

CLINICAL DATA: Syncope with unresponsiveness. Difficulty with hand
coordination and understanding directions

EXAM:
CT HEAD WITHOUT CONTRAST
TECHNIQUE: Contiguous axial images were obtained from the base of the skull
through the vertex without intravenous contrast.

[Series 2: head w/(date) · axial · 0.44mm/px · z∈[-184,-38]mm · 16 of 33 slices shown, 20 images]
[im 2/33  brain]
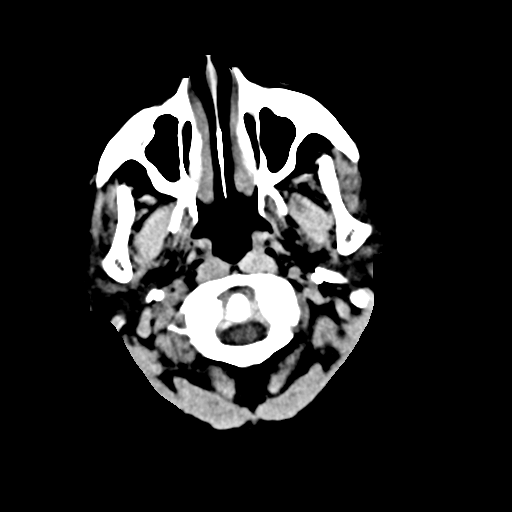
[im 2/33  bone]
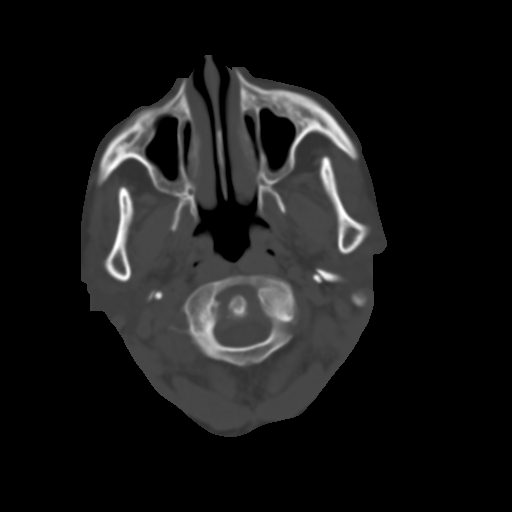
[im 4/33  brain]
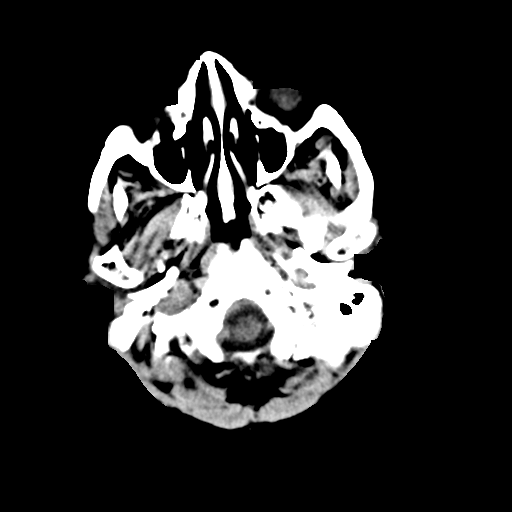
[im 6/33  brain]
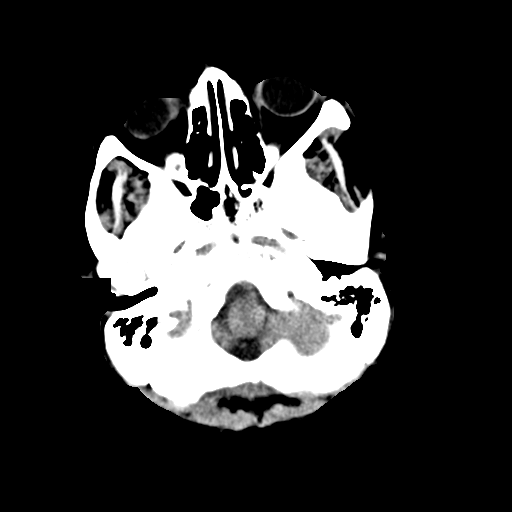
[im 8/33  brain]
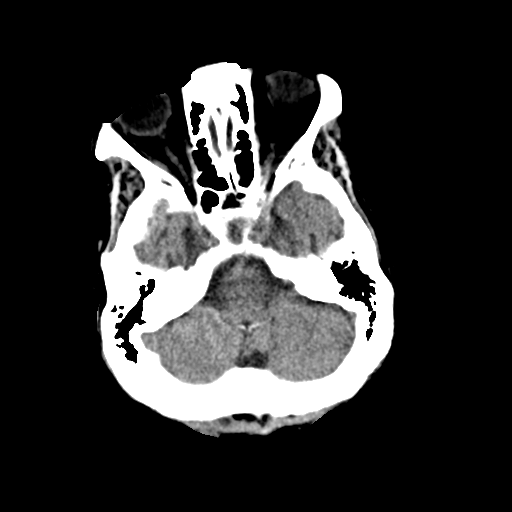
[im 9/33  brain]
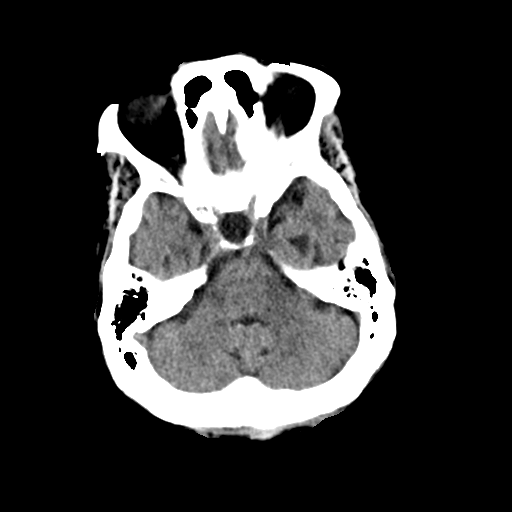
[im 9/33  bone]
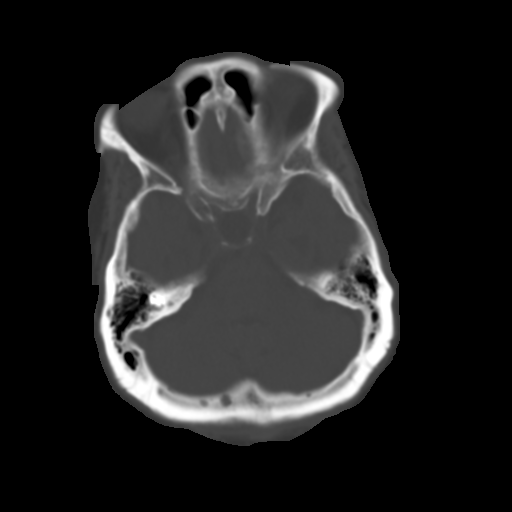
[im 12/33  brain]
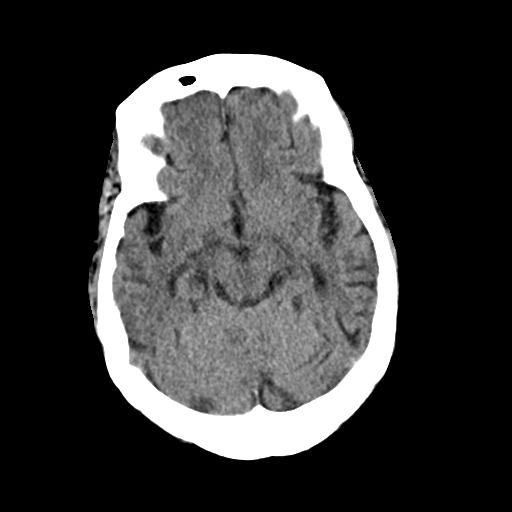
[im 14/33  brain]
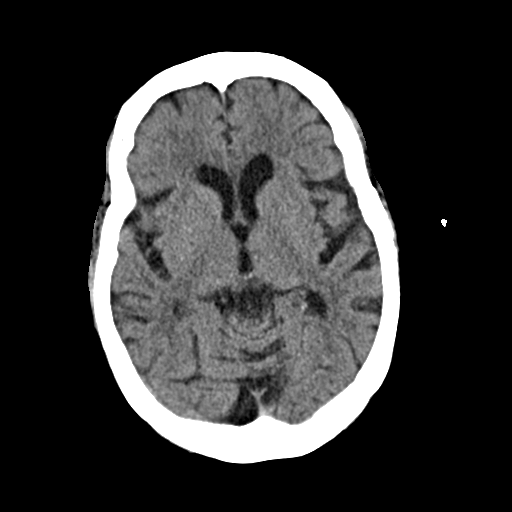
[im 16/33  brain]
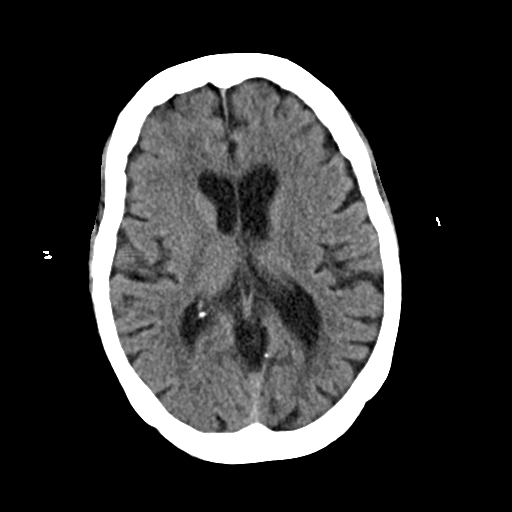
[im 17/33  brain]
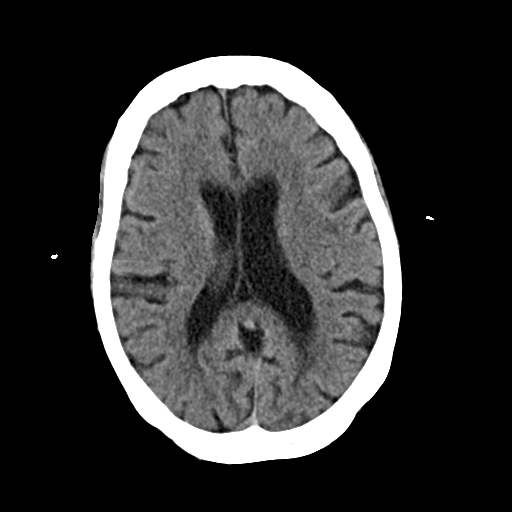
[im 17/33  bone]
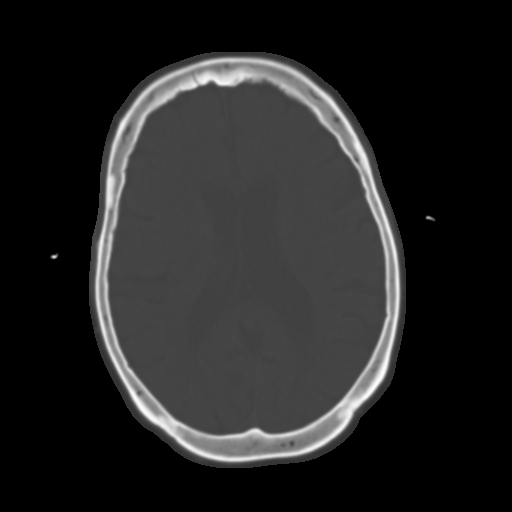
[im 19/33  brain]
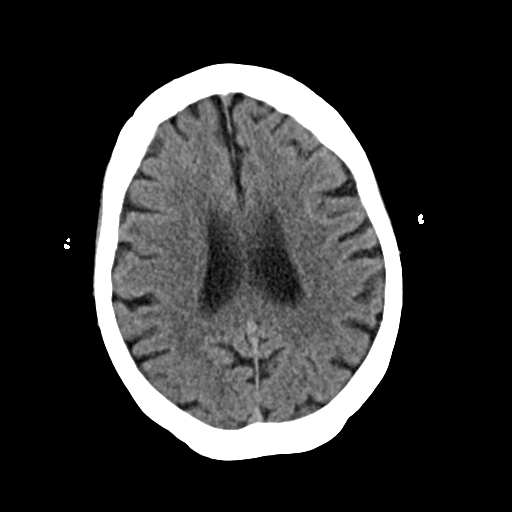
[im 21/33  brain]
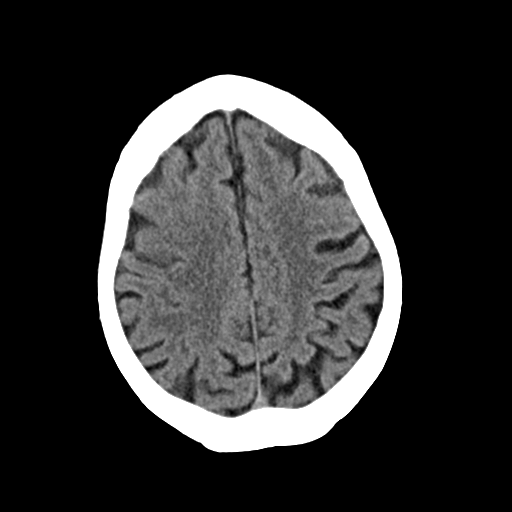
[im 24/33  brain]
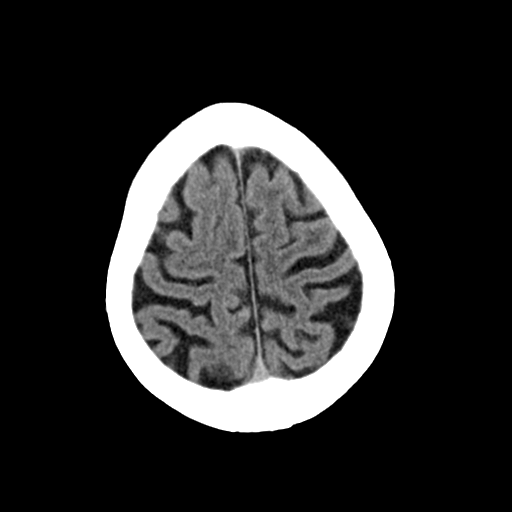
[im 25/33  brain]
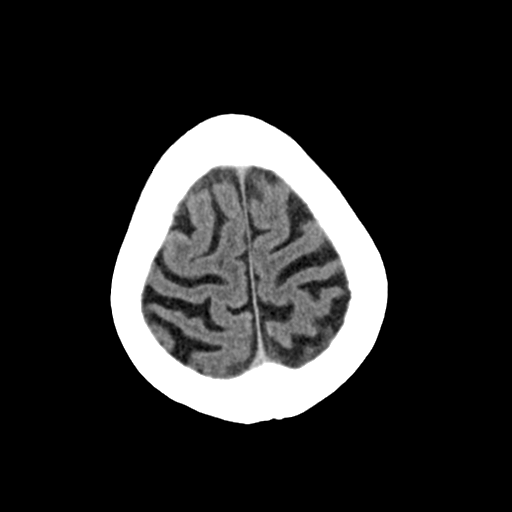
[im 25/33  bone]
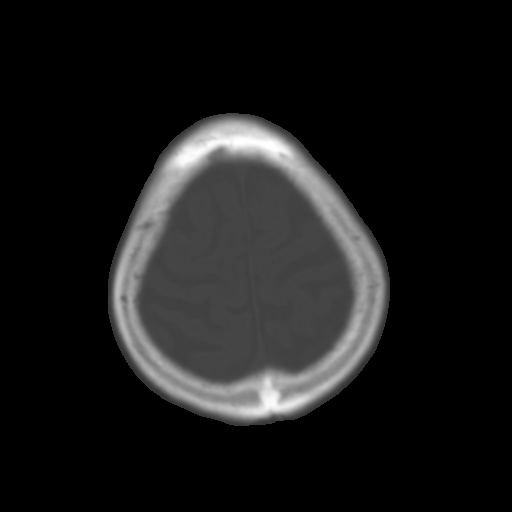
[im 27/33  brain]
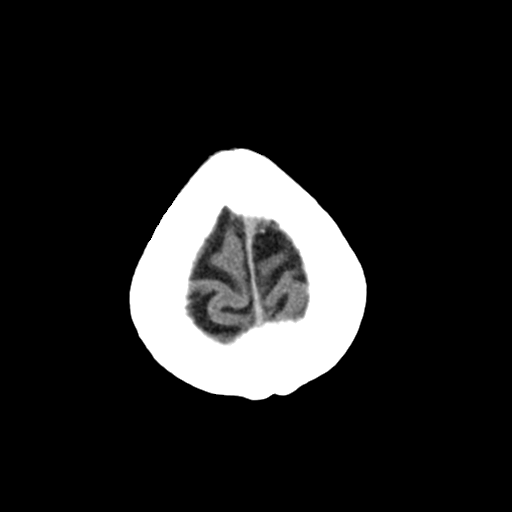
[im 29/33  brain]
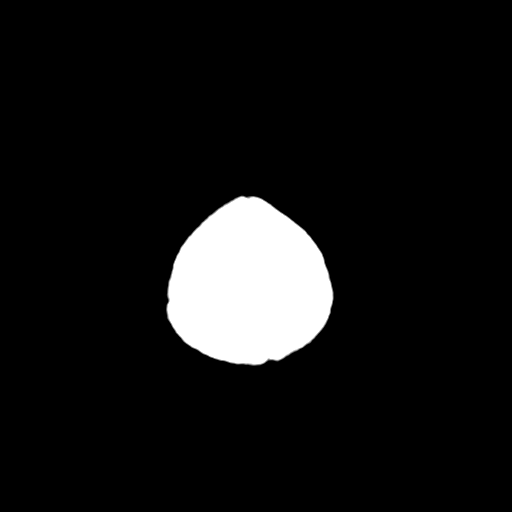
[im 31/33  brain]
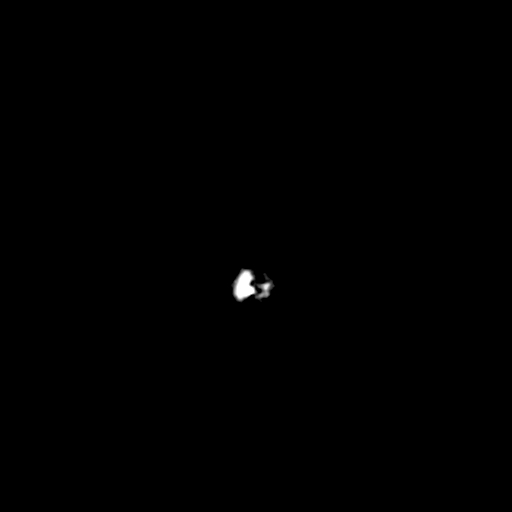

[16 of 30 positions shown; findings below may reference images not displayed]

FINDINGS: Brain: Diffuse cerebral atrophy. Ventricular dilatation consistent
with central atrophy. Low-attenuation changes in the deep white
matter consistent with small vessel ischemia. No abnormal
extra-axial fluid collections. No mass effect or midline shift.
Gray-white matter junctions are distinct. Basal cisterns are not
effaced. No acute intracranial hemorrhage.

Vascular: No hyperdense vessel or unexpected calcification.

Skull: Normal. Negative for fracture or focal lesion.

Sinuses/Orbits: No acute finding.

Other: No change since prior study.
IMPRESSION: 1. No acute intracranial abnormalities.
2. Chronic atrophy and small vessel ischemic changes.

## 2021-05-30 ENCOUNTER — Other Ambulatory Visit: Payer: Self-pay | Admitting: Neurology

## 2021-06-03 ENCOUNTER — Telehealth: Payer: Self-pay | Admitting: Neurology

## 2021-06-03 ENCOUNTER — Other Ambulatory Visit: Payer: Self-pay | Admitting: Neurology

## 2021-06-03 ENCOUNTER — Other Ambulatory Visit: Payer: Self-pay | Admitting: *Deleted

## 2021-06-03 ENCOUNTER — Other Ambulatory Visit: Payer: Self-pay | Admitting: Nurse Practitioner

## 2021-06-03 DIAGNOSIS — F0391 Unspecified dementia with behavioral disturbance: Secondary | ICD-10-CM

## 2021-06-03 DIAGNOSIS — F32A Depression, unspecified: Secondary | ICD-10-CM

## 2021-06-03 DIAGNOSIS — F0390 Unspecified dementia without behavioral disturbance: Secondary | ICD-10-CM

## 2021-06-03 MED ORDER — MEMANTINE HCL 10 MG PO TABS: 10.0000 mg | ORAL_TABLET | Freq: Two times a day (BID) | ORAL | 3 refills | Status: AC

## 2021-06-03 NOTE — Telephone Encounter (Signed)
Refills have been sent.  

## 2021-06-03 NOTE — Telephone Encounter (Signed)
Pt's annual med check has been scheduled, pt needs a refill on donepezil (ARICEPT) 10 MG tablet & memantine (NAMENDA) 10 MG tablet to [id number]

## 2021-06-04 ENCOUNTER — Ambulatory Visit: Payer: Medicare Other | Admitting: Nurse Practitioner

## 2021-06-04 ENCOUNTER — Telehealth: Payer: Self-pay

## 2021-06-04 NOTE — Telephone Encounter (Signed)
His nurse called patient in regards to missed AWV. Daughter [name] states that she did not know and was not reminded. Rescheduled for 07/22/2021.

## 2021-07-08 ENCOUNTER — Other Ambulatory Visit: Payer: Self-pay | Admitting: Nurse Practitioner

## 2021-07-08 DIAGNOSIS — F0391 Unspecified dementia with behavioral disturbance: Secondary | ICD-10-CM

## 2021-07-08 DIAGNOSIS — F32A Depression, unspecified: Secondary | ICD-10-CM

## 2021-07-22 ENCOUNTER — Encounter: Payer: Medicare Other | Admitting: Nurse Practitioner

## 2021-07-22 ENCOUNTER — Telehealth: Payer: Self-pay

## 2021-07-22 NOTE — Telephone Encounter (Signed)
This nurse attempted to call patient in regard to missed appointments. Message left for daughter [name] to call back in order to reschedule.

## 2021-07-24 ENCOUNTER — Telehealth: Payer: Medicare Other

## 2021-07-24 ENCOUNTER — Ambulatory Visit (INDEPENDENT_AMBULATORY_CARE_PROVIDER_SITE_OTHER): Payer: Medicare Other

## 2021-07-24 DIAGNOSIS — E876 Hypokalemia: Secondary | ICD-10-CM

## 2021-07-24 DIAGNOSIS — I10 Essential (primary) hypertension: Secondary | ICD-10-CM

## 2021-07-24 DIAGNOSIS — F0390 Unspecified dementia without behavioral disturbance: Secondary | ICD-10-CM

## 2021-07-24 NOTE — Progress Notes (Signed)
This encounter was created in error - please disregard.

## 2021-07-24 NOTE — Patient Instructions (Signed)
Visit Information  PATIENT GOALS:  Goals Addressed      COMPLETED: Dementia - optimal cognition function       Timeframe:  Long-Range Goal Priority:  High Start Date:  04/02/21                           Expected End Date:  04/02/22    Next Scheduled Follow up date: 07/24/21    Self Care Activities:  Continue to keep all scheduled follow up appointments Take medications as directed  Let your healthcare team know if you are unable to take your medications Call your pharmacy for refills at least 7 days prior to running out of medication Patient Goals: - keep cognition stable                        COMPLETED: Hypokalemia - complications minimized or prevented       Timeframe:  Long-Range Goal Priority:  High Start Date:  04/02/21                          Expected End Date:  04/02/22    Next Scheduled Follow up date: 07/24/21     Self Care Activities:  Continue to keep all scheduled follow up appointments Take medications as directed  Let your healthcare team know if you are unable to take your medications Call your pharmacy for refills at least 7 days prior to running out of medication Add potassium rich foods to diet  Patient Goals: -  increase Potassium level                   COMPLETED: Quality of Life Maintained       Timeframe:  Long-Range Goal Priority:  Medium Start Date:  6.8.22                           Expected End Date:   10.6.22                    Next planned outreach: 10.6.22  Patient Goals/Self-Care Activities patient will: with the help of her daughter  - Engage with [employer]  -Solicitor SW as needed prior to next scheduled call        The patient verbalized understanding of instructions, educational materials, and care plan provided today and declined offer to receive copy of patient instructions, Transport planner, and care plan.   No further follow up required:    Delsa Sale, RN, BSN, CCM Care Management Coordinator [facility]  Care Management/[facility]  Direct Phone: [phone]

## 2021-07-24 NOTE — Chronic Care Management (AMB) (Signed)
Chronic Care Management   CCM RN Visit Note  07/24/2021 Name: Patricia Simmons MRN: [id number] DOB: Feb 05, 1936  Subjective: Patricia Simmons is a 85 y.o. year old female who is a primary care patient of Arnette Felts, FNP. The care management team was consulted for assistance with disease management and care coordination needs.    Engaged with patient by telephone for follow up visit in response to provider referral for case management and/or care coordination services.   Consent to Services:  The patient was given information about Chronic Care Management services, agreed to services, and gave verbal consent prior to initiation of services.  Please see initial visit note for detailed documentation.   Patient agreed to services and verbal consent obtained.   Assessment: Review of patient past medical history, allergies, medications, health status, including review of consultants reports, laboratory and other test data, was performed as part of comprehensive evaluation and provision of chronic care management services.   SDOH (Social Determinants of Health) assessments and interventions performed:    CCM Care Plan  Allergies  Allergen Reactions   Codeine     unknown    Outpatient Encounter Medications as of 07/24/2021  Medication Sig   acetaminophen (TYLENOL) 500 MG tablet Take 1,000 mg by mouth every 6 (six) hours as needed for mild pain or headache.   citalopram (CELEXA) 10 MG tablet Take 1 tablet by mouth once daily   diclofenac sodium (VOLTAREN) 1 % GEL Apply 2 g topically 4 (four) times daily.   donepezil (ARICEPT) 10 MG tablet TAKE 1 TABLET BY MOUTH IN THE EVENING MUST  BE  SEEN  FOR  FURTHER  REFILLS.  CALL  [phone]   hydrochlorothiazide (HYDRODIURIL) 12.5 MG tablet Take 1 tablet (12.5 mg total) by mouth daily.   ipratropium (ATROVENT) 0.03 % nasal spray Place 2 sprays into both nostrils every 12 (twelve) hours.   losartan (COZAAR) 50 MG tablet Take 1 tablet by mouth once  daily   magic mouthwash w/lidocaine SOLN Take 5 mLs by mouth 3 (three) times daily as needed for mouth pain.   memantine (NAMENDA) 10 MG tablet Take 1 tablet (10 mg total) by mouth 2 (two) times daily.   omeprazole (PRILOSEC) 40 MG capsule Take 1 capsule by mouth once daily   Potassium Chloride ER 20 MEQ TBCR Take 1 tablet by mouth daily.   QUEtiapine (SEROQUEL) 25 MG tablet Take 1 tablet by mouth once daily   triamcinolone cream (KENALOG) 0.1 % Apply 1 application topically 2 (two) times daily.   No facility-administered encounter medications on file as of 07/24/2021.    Patient Active Problem List   Diagnosis Date Noted   Lower abdominal pain 07/29/2020   Increased frequency of urination 06/11/2019   Hallucination 06/11/2019   Left hip pain 04/03/2019   Fall 04/03/2019   Keloid 12/21/2018   Dementia without behavioral disturbance (HCC) 09/08/2018   Depression 09/08/2018   Hypokalemia 07/30/2018   Chest pain 07/30/2018   GERD (gastroesophageal reflux disease) 07/30/2018   Essential hypertension 07/30/2018    Conditions to be addressed/monitored: HTN, Dementia, Hypokalemia  Care Plan : Hypokalemia  Updates made by Riley Churches, RN since 07/24/2021 12:00 AM  Completed 07/24/2021   Problem: Hypokalemia Resolved 07/24/2021  Priority: High     Long-Range Goal: Hypokalemia - complications minimized or prevented Completed 07/24/2021  Start Date: 04/02/2021  Expected End Date: 04/02/2022  Recent Progress: On track  Priority: High  Note:   Current Barriers:  Ineffective Self  Health Maintenance  Dementia  Clinical Goal(s):  Collaboration with Arnette Felts, FNP regarding development and update of comprehensive plan of care as evidenced by provider attestation and co-signature Inter-disciplinary care team collaboration (see longitudinal plan of care) patient will work with care management team to address care coordination and chronic disease management needs related to Disease  Management Educational Needs Care Coordination Medication Management and Education Psychosocial Support Dementia and Caregiver Support   Interventions:  07/24/21 completed successful outbound call with daughter Patricia Simmons Evaluation of current treatment plan related to  HTN, Dementia, Hypokalemia , self-management and patient's adherence to plan as established by provider. Collaboration with Arnette Felts, FNP regarding development and update of comprehensive plan of care as evidenced by provider attestation       and co-signature Inter-disciplinary care team collaboration (see longitudinal plan of care) Determined patient was enrolled in the [facility] program and will start on 08/08/21 Determined daughter Patricia Simmons is aware that [facility] will take over her mother's medical management moving forward Determined Patricia Simmons understands patient will no longer be eligible for CCM services, she expresses her gratitude for the care she has received from [facility] and the CCM team Sent in basket message to PCP Arnette Felts FNP, Elisha Ponder LPN and the CCM team to notify of patients enrollment with [facility] Self Care Activities:  Continue to keep all scheduled follow up appointments Take medications as directed  Let your healthcare team know if you are unable to take your medications Call your pharmacy for refills at least 7 days prior to running out of medication Add potassium rich foods to diet  Patient Goals: -  increase Potassium level   Follow Up Plan: No further follow up required:         Care Plan : Dementia (Adult)  Updates made by Riley Churches, RN since 07/24/2021 12:00 AM  Completed 07/24/2021   Problem: Cognitive Function Resolved 07/24/2021  Priority: High     Long-Range Goal: Optimal Cognitive Function Completed 07/24/2021  Start Date: 04/02/2021  Expected End Date: 04/02/2022  Recent Progress: On track  Priority: High  Note:   Current Barriers:  Ineffective Self Health Maintenance  Dementia   Clinical Goal(s):  Collaboration with Arnette Felts, FNP regarding development and update of comprehensive plan of care as evidenced by provider attestation and co-signature Inter-disciplinary care team collaboration (see longitudinal plan of care) patient will work with care management team to address care coordination and chronic disease management needs related to Disease Management Educational Needs Care Coordination Medication Management and Education Psychosocial Support Dementia and Caregiver Support   Interventions:  Evaluation of current treatment plan related to  HTN, Dementia, Hypokalemia , self-management and patient's adherence to plan as established by provider. Collaboration with Arnette Felts, FNP regarding development and update of comprehensive plan of care as evidenced by provider attestation       and co-signature Inter-disciplinary care team collaboration (see longitudinal plan of care) Determined patient was enrolled in the [facility] program and will start on 08/08/21 Determined daughter Patricia Simmons is aware that [facility] will take over her mother's medical management moving forward Determined Patricia Simmons understands patient will no longer be eligible for CCM services, she expresses her gratitude for the care she has received from [facility] and the CCM team Sent in basket message to PCP Arnette Felts FNP, Elisha Ponder LPN and the CCM team to notify of patients enrollment with [facility] Self Care Activities:  Continue to keep all scheduled follow up appointments Take medications as directed  Let  your healthcare team know if you are unable to take your medications Call your pharmacy for refills at least 7 days prior to running out of medication Patient Goals: - keep cognition stable   Follow Up Plan: Follow up with provider re:         Care Plan : Social Work [facility] Care Plan  Updates made by Riley Churches, RN since 07/24/2021 12:00 AM  Completed 07/24/2021   Problem: Quality of Life (General  Plan of Care) Resolved 07/24/2021     Long-Range Goal: Quality of Life Maintained Completed 07/24/2021  Start Date: 04/15/2021  Expected End Date: 08/13/2021  Recent Progress: On track  Priority: Medium  Note:   Current Barriers:  Chronic disease management support and education needs related to HTN and Dementia  Limited knowledge of housing resources to  Social Worker Clinical Goal(s):  patient will work with SW to identify and address any acute and/or chronic care coordination needs related to the self health management of HTN and Dementia  Patient and her daughter will follow up with [employer] as directed by [employer]  SW Interventions:  Inter-disciplinary care team collaboration (see longitudinal plan of care) Collaboration with Arnette Felts, FNP regarding development and update of comprehensive plan of care as evidenced by provider attestation and co-signature Determined patient was enrolled in the [facility] program and will start on 08/08/21 Determined daughter Patricia Simmons is aware that [facility] will take over her mother's medical management moving forward Determined Patricia Simmons understands patient will no longer be eligible for CCM services, she expresses her gratitude for the care she has received from [facility] and the CCM team Sent in basket message to PCP Arnette Felts FNP, Elisha Ponder LPN and the CCM team to notify of patients enrollment with [facility] Patient Goals/Self-Care Activities patient will: with the help of her daughter  -  Engage with [employer]  -Contact SW as needed prior to next scheduled call  Follow Up Plan: No further follow up required      Plan:No further follow up required:    Delsa Sale, RN, BSN, CCM Care Management Coordinator [facility] [facility]  Direct Phone: [phone]

## 2021-07-31 NOTE — Progress Notes (Signed)
[place], she is transferring to [facility]

## 2021-08-06 ENCOUNTER — Ambulatory Visit: Payer: Medicare Other

## 2021-08-07 DIAGNOSIS — I10 Essential (primary) hypertension: Secondary | ICD-10-CM | POA: Diagnosis not present

## 2021-08-07 DIAGNOSIS — F0390 Unspecified dementia without behavioral disturbance: Secondary | ICD-10-CM | POA: Diagnosis not present

## 2021-08-13 ENCOUNTER — Telehealth: Payer: Medicare Other

## 2021-09-30 ENCOUNTER — Ambulatory Visit: Payer: Medicare Other | Admitting: Neurology

## 2021-10-19 ENCOUNTER — Other Ambulatory Visit: Payer: Medicare Other

## 2024-10-08 ENCOUNTER — Inpatient Hospital Stay (HOSPITAL_COMMUNITY)
Admission: EM | Admit: 2024-10-08 | Discharge: 2024-10-09 | DRG: 689 | Disposition: A | Payer: Medicare (Managed Care) | Attending: Family Medicine | Admitting: Family Medicine

## 2024-10-08 ENCOUNTER — Emergency Department (HOSPITAL_COMMUNITY): Payer: Medicare (Managed Care)

## 2024-10-08 ENCOUNTER — Other Ambulatory Visit: Payer: Self-pay

## 2024-10-08 DIAGNOSIS — J690 Pneumonitis due to inhalation of food and vomit: Secondary | ICD-10-CM

## 2024-10-08 DIAGNOSIS — F05 Delirium due to known physiological condition: Secondary | ICD-10-CM | POA: Diagnosis present

## 2024-10-08 DIAGNOSIS — Z79899 Other long term (current) drug therapy: Secondary | ICD-10-CM | POA: Diagnosis not present

## 2024-10-08 DIAGNOSIS — Z823 Family history of stroke: Secondary | ICD-10-CM | POA: Diagnosis not present

## 2024-10-08 DIAGNOSIS — Z820 Family history of epilepsy and other diseases of the nervous system: Secondary | ICD-10-CM | POA: Diagnosis not present

## 2024-10-08 DIAGNOSIS — R4182 Altered mental status, unspecified: Secondary | ICD-10-CM | POA: Diagnosis not present

## 2024-10-08 DIAGNOSIS — E0590 Thyrotoxicosis, unspecified without thyrotoxic crisis or storm: Secondary | ICD-10-CM | POA: Diagnosis present

## 2024-10-08 DIAGNOSIS — Z801 Family history of malignant neoplasm of trachea, bronchus and lung: Secondary | ICD-10-CM | POA: Diagnosis not present

## 2024-10-08 DIAGNOSIS — N3000 Acute cystitis without hematuria: Secondary | ICD-10-CM

## 2024-10-08 DIAGNOSIS — I10 Essential (primary) hypertension: Secondary | ICD-10-CM | POA: Diagnosis present

## 2024-10-08 DIAGNOSIS — F0390 Unspecified dementia without behavioral disturbance: Secondary | ICD-10-CM | POA: Diagnosis present

## 2024-10-08 DIAGNOSIS — N179 Acute kidney failure, unspecified: Secondary | ICD-10-CM

## 2024-10-08 DIAGNOSIS — Z885 Allergy status to narcotic agent status: Secondary | ICD-10-CM | POA: Diagnosis not present

## 2024-10-08 DIAGNOSIS — K529 Noninfective gastroenteritis and colitis, unspecified: Secondary | ICD-10-CM | POA: Diagnosis present

## 2024-10-08 DIAGNOSIS — Z789 Other specified health status: Secondary | ICD-10-CM

## 2024-10-08 DIAGNOSIS — G9341 Metabolic encephalopathy: Secondary | ICD-10-CM | POA: Diagnosis present

## 2024-10-08 LAB — COMPREHENSIVE METABOLIC PANEL WITH GFR
ALT: 10 U/L (ref 0–44)
AST: 20 U/L (ref 15–41)
Albumin: 3.6 g/dL (ref 3.5–5.0)
Alkaline Phosphatase: 63 U/L (ref 38–126)
Anion gap: 10 (ref 5–15)
BUN: 31 mg/dL — ABNORMAL HIGH (ref 8–23)
CO2: 29 mmol/L (ref 22–32)
Calcium: 9.7 mg/dL (ref 8.9–10.3)
Chloride: 100 mmol/L (ref 98–111)
Creatinine, Ser: 1.35 mg/dL — ABNORMAL HIGH (ref 0.44–1.00)
GFR, Estimated: 38 mL/min — ABNORMAL LOW (ref >60)
Glucose, Bld: 106 mg/dL — ABNORMAL HIGH (ref 70–99)
Potassium: 4.8 mmol/L (ref 3.5–5.1)
Sodium: 139 mmol/L (ref 135–145)
Total Bilirubin: 0.9 mg/dL (ref 0.0–1.2)
Total Protein: 6.6 g/dL (ref 6.5–8.1)

## 2024-10-08 LAB — CBG MONITORING, ED: Glucose-Capillary: 99 mg/dL (ref 70–99)

## 2024-10-08 LAB — CBC WITH DIFFERENTIAL/PLATELET
Abs Immature Granulocytes: 0.02 K/uL (ref 0.00–0.07)
Basophils Absolute: 0 K/uL (ref 0.0–0.1)
Basophils Relative: 0 %
Eosinophils Absolute: 0 K/uL (ref 0.0–0.5)
Eosinophils Relative: 0 %
HCT: 36.8 % (ref 36.0–46.0)
Hemoglobin: 11.9 g/dL — ABNORMAL LOW (ref 12.0–15.0)
Immature Granulocytes: 0 %
Lymphocytes Relative: 27 %
Lymphs Abs: 1.5 K/uL (ref 0.7–4.0)
MCH: 31.5 pg (ref 26.0–34.0)
MCHC: 32.3 g/dL (ref 30.0–36.0)
MCV: 97.4 fL (ref 80.0–100.0)
Monocytes Absolute: 0.4 K/uL (ref 0.1–1.0)
Monocytes Relative: 8 %
Neutro Abs: 3.4 K/uL (ref 1.7–7.7)
Neutrophils Relative %: 65 %
Platelets: 187 K/uL (ref 150–400)
RBC: 3.78 MIL/uL — ABNORMAL LOW (ref 3.87–5.11)
RDW: 12.3 % (ref 11.5–15.5)
WBC: 5.3 K/uL (ref 4.0–10.5)
nRBC: 0 % (ref 0.0–0.2)

## 2024-10-08 LAB — URINALYSIS, W/ REFLEX TO CULTURE (INFECTION SUSPECTED)
Bilirubin Urine: NEGATIVE
Glucose, UA: NEGATIVE mg/dL
Hgb urine dipstick: NEGATIVE
Ketones, ur: NEGATIVE mg/dL
Nitrite: NEGATIVE
Protein, ur: NEGATIVE mg/dL
Specific Gravity, Urine: 1.019 (ref 1.005–1.030)
pH: 5 (ref 5.0–8.0)

## 2024-10-08 MED ORDER — SODIUM CHLORIDE 0.9 % IV BOLUS
500.0000 mL | Freq: Once | INTRAVENOUS | Status: AC
  Administered 2024-10-08: 500 mL via INTRAVENOUS

## 2024-10-08 MED ORDER — MEMANTINE HCL 10 MG PO TABS
10.0000 mg | ORAL_TABLET | Freq: Two times a day (BID) | ORAL | Status: DC
  Filled 2024-10-08: qty 1

## 2024-10-08 MED ORDER — CITALOPRAM HYDROBROMIDE 20 MG PO TABS: 10.0000 mg | ORAL_TABLET | Freq: Every day | ORAL | Status: DC

## 2024-10-08 MED ORDER — SODIUM CHLORIDE 0.9 % IV SOLN: 1.0000 g | INTRAVENOUS | Status: DC

## 2024-10-08 MED ORDER — ACETAMINOPHEN 650 MG RE SUPP: 650.0000 mg | Freq: Four times a day (QID) | RECTAL | Status: DC | PRN

## 2024-10-08 MED ORDER — SODIUM CHLORIDE 0.9 % IV SOLN
500.0000 mg | INTRAVENOUS | Status: DC
  Filled 2024-10-08: qty 5

## 2024-10-08 MED ORDER — SPIRONOLACTONE 25 MG PO TABS: 25.0000 mg | ORAL_TABLET | Freq: Every day | ORAL | Status: DC

## 2024-10-08 MED ORDER — VITAMIN B-12 1000 MCG PO TABS: 1000.0000 ug | ORAL_TABLET | Freq: Every day | ORAL | Status: DC

## 2024-10-08 MED ORDER — SODIUM CHLORIDE 0.9 % IV SOLN
500.0000 mg | Freq: Once | INTRAVENOUS | Status: AC
  Administered 2024-10-08: 500 mg via INTRAVENOUS
  Filled 2024-10-08: qty 5

## 2024-10-08 MED ORDER — HEPARIN SODIUM (PORCINE) 5000 UNIT/ML IJ SOLN
5000.0000 [IU] | Freq: Three times a day (TID) | INTRAMUSCULAR | Status: DC
  Administered 2024-10-08 – 2024-10-09 (×3): 5000 [IU] via SUBCUTANEOUS
  Filled 2024-10-08 (×4): qty 1

## 2024-10-08 MED ORDER — QUETIAPINE 12.5 MG HALF TABLET: 25.0000 mg | ORAL_TABLET | Freq: Every day | ORAL | Status: DC

## 2024-10-08 MED ORDER — VITAMIN D 25 MCG (1000 UNIT) PO TABS: 400.0000 [IU] | ORAL_TABLET | Freq: Every day | ORAL | Status: DC

## 2024-10-08 MED ORDER — PANTOPRAZOLE SODIUM 40 MG PO TBEC: 40.0000 mg | DELAYED_RELEASE_TABLET | Freq: Every day | ORAL | Status: DC

## 2024-10-08 MED ORDER — LOSARTAN POTASSIUM 50 MG PO TABS: 50.0000 mg | ORAL_TABLET | Freq: Every day | ORAL | Status: DC

## 2024-10-08 MED ORDER — DONEPEZIL HCL 10 MG PO TABS: 10.0000 mg | ORAL_TABLET | Freq: Every day | ORAL | Status: DC

## 2024-10-08 MED ORDER — SODIUM CHLORIDE 0.9 % IV SOLN: 1.0000 g | Freq: Once | INTRAVENOUS | Status: DC

## 2024-10-08 MED ORDER — ACETAMINOPHEN 325 MG PO TABS: 650.0000 mg | ORAL_TABLET | Freq: Four times a day (QID) | ORAL | Status: DC | PRN

## 2024-10-08 MED ORDER — SODIUM CHLORIDE 0.9 % IV SOLN
2.0000 g | Freq: Once | INTRAVENOUS | Status: AC
  Administered 2024-10-08: 2 g via INTRAVENOUS
  Filled 2024-10-08: qty 20

## 2024-10-08 NOTE — Plan of Care (Signed)
 FMTS Interim Progress Note  S: Patient evaluated w/ Dr Tharon, no family at bedside. Carried out simple conversation, no acute concerns or pain.   O: BP 136/84 (BP Location: Right Arm)   Pulse 94   Temp (!) 96.7 F (35.9 C) (Oral)   Resp 16   SpO2 (!) 71%   General: Awake, alert. NAD.  Respiratory: normal WOB on RA  A/P:  AMS iso preexisting dementia likely 2/2 to infection.  Patient resting comfortably in bed w/ VSS.  CTX + azithromycin on board for coverage of CAP and UTI Urine culture pending Reported improvement in cognition since this AM per H&P.   Pending contact w/ HCPOA (daughter), default to full code.   Manon Jester, DO 10/08/2024, 8:31 PM PGY-1, [facility] Family Medicine Service pager [phone]

## 2024-10-08 NOTE — ED Provider Notes (Addendum)
 [place] EMERGENCY DEPARTMENT AT [facility] Provider Note   CSN: [id number] Arrival date & time: 10/08/24  1017     Patient presents with: Altered Mental Status   Patricia Simmons is a 88 y.o. female.   Patient with history of dementia, hypertension presents from home with complaints of altered mental status.  With advanced dementia, no family at bedside, daughter Patricia Simmons whom the patient lives with phone is disconnected, did discuss briefly with patient's older sister Patricia Simmons who reports that patient lives with the daughter Patricia Simmons and has advanced dementia.  Reports that she will normally only say few nonsensical words, does not recognize family and this is baseline. She is unsure of events that lead her to be transported to the hospital.   Patients sister arrived after some time, reports that the patient was less arousable today than normal. Does report that she had 1 episode of vomiting yesterday, however has not been complaining of abdominal pain. Has not reported any complaints.   Level 5 caveat -- altered mental status   The history is provided by the patient and a relative. No language interpreter was used.  Altered Mental Status      Prior to Admission medications   Medication Sig Start Date End Date Taking? Authorizing Provider  acetaminophen  (TYLENOL ) 500 MG tablet Take 1,000 mg by mouth every 6 (six) hours as needed for mild pain or headache.    [provider]  citalopram  (CELEXA ) 10 MG tablet Take 1 tablet by mouth once daily 07/08/21   Georgina Speaks, FNP  diclofenac  sodium (VOLTAREN ) 1 % GEL Apply 2 g topically 4 (four) times daily. 02/01/19   Jarold Medici, MD  donepezil  (ARICEPT ) 10 MG tablet TAKE 1 TABLET BY MOUTH IN THE EVENING MUST  BE  SEEN  FOR  FURTHER  REFILLS.  CALL  [phone] 06/03/21   Ines Onetha NOVAK, MD  hydrochlorothiazide  (HYDRODIURIL ) 12.5 MG tablet Take 1 tablet (12.5 mg total) by mouth daily. 08/25/20   Moore, Janece, FNP   ipratropium (ATROVENT ) 0.03 % nasal spray Place 2 sprays into both nostrils every 12 (twelve) hours. 05/05/20   Moore, Janece, FNP  losartan  (COZAAR ) 50 MG tablet Take 1 tablet by mouth once daily 05/04/21   Georgina Speaks, FNP  magic mouthwash w/lidocaine  SOLN Take 5 mLs by mouth 3 (three) times daily as needed for mouth pain. 09/27/19   Georgina Speaks, FNP  memantine  (NAMENDA ) 10 MG tablet Take 1 tablet (10 mg total) by mouth 2 (two) times daily. 06/03/21   Ines Onetha NOVAK, MD  omeprazole  (PRILOSEC) 40 MG capsule Take 1 capsule by mouth once daily 05/04/21   Georgina Speaks, FNP  Potassium Chloride  ER 20 MEQ TBCR Take 1 tablet by mouth daily. 04/02/21   Moore, Janece, FNP  QUEtiapine  (SEROQUEL ) 25 MG tablet Take 1 tablet by mouth once daily 07/08/21   Moore, Janece, FNP  triamcinolone  cream (KENALOG ) 0.1 % Apply 1 application topically 2 (two) times daily. 06/24/20   Georgina Speaks, FNP    Allergies: Codeine    Review of Systems  Unable to perform ROS: Dementia    Updated Vital Signs BP 115/68   Pulse 84   Temp 98.2 F (36.8 C) (Axillary)   Resp 14   SpO2 100%   Physical Exam Vitals and nursing note reviewed.  Constitutional:      General: She is not in acute distress.    Appearance: Normal appearance. She is normal weight. She is not ill-appearing, toxic-appearing or  diaphoretic.  HENT:     Head: Normocephalic and atraumatic.  Eyes:     Extraocular Movements: Extraocular movements intact.     Pupils: Pupils are equal, round, and reactive to light.  Cardiovascular:     Rate and Rhythm: Normal rate and regular rhythm.     Heart sounds: Normal heart sounds.  Pulmonary:     Effort: Pulmonary effort is normal. No respiratory distress.     Breath sounds: Normal breath sounds.  Abdominal:     General: Abdomen is flat.     Palpations: Abdomen is soft.     Tenderness: There is no abdominal tenderness.  Musculoskeletal:        General: Normal range of motion.     Cervical back: Normal  range of motion.  Skin:    General: Skin is warm and dry.  Neurological:     General: No focal deficit present.     Mental Status: She is alert. Mental status is at baseline.     Comments: Alert to self only, moves extremities equally and will follow some commands. Very difficult to understand speech  Psychiatric:        Mood and Affect: Mood normal.        Behavior: Behavior normal.     (all labs ordered are listed, but only abnormal results are displayed) Labs Reviewed  COMPREHENSIVE METABOLIC PANEL WITH GFR - Abnormal; Notable for the following components:      Result Value   Glucose, Bld 106 (*)    BUN 31 (*)    Creatinine, Ser 1.35 (*)    GFR, Estimated 38 (*)    All other components within normal limits  CBC WITH DIFFERENTIAL/PLATELET - Abnormal; Notable for the following components:   RBC 3.78 (*)    Hemoglobin 11.9 (*)    All other components within normal limits  URINALYSIS, W/ REFLEX TO CULTURE (INFECTION SUSPECTED)  CBG MONITORING, ED    EKG: EKG Interpretation Date/Time:  Monday October 08 2024 12:12:20 EST Ventricular Rate:  83 PR Interval:  215 QRS Duration:  85 QT Interval:  352 QTC Calculation: 414 R Axis:   13  Text Interpretation: Sinus rhythm Borderline prolonged PR interval Low voltage, precordial leads when compared top rior, similar appearance No STEMI Confirmed by Ginger Barefoot ([id number]) on 10/08/2024 2:47:25 PM  Radiology: [facility] Chest Port 1 View Result Date: 10/08/2024 EXAM: 1 VIEW(S) XRAY OF THE CHEST 10/08/2024 12:51:00 PM COMPARISON: 01/09/2019 CLINICAL HISTORY: altered mental status FINDINGS: LINES, TUBES AND DEVICES: Multiple wires and leads project over the chest on the frontal radiograph. LUNGS AND PLEURA: Patient rotated to the left. Mild left hemidiaphragm elevation. Medial left lower lobe airspace disease is suspected, possibly obscured by overlying EKG lead. No pleural effusion. No pneumothorax. HEART AND MEDIASTINUM: No acute abnormality of  the cardiac and mediastinal silhouettes. BONES AND SOFT TISSUES: No acute osseous abnormality. IMPRESSION: 1. Suspect medial left lower lobe airspace disease, suboptimally evaluated on this oblique portable radiograph.recommend short term radiographic follow up to exclude early infection or aspiration Electronically signed by: Rockey Kilts MD 10/08/2024 02:07 PM EST RP Workstation: [id number]   CT HEAD WO CONTRAST Result Date: 10/08/2024 EXAM: CT HEAD WITHOUT 10/08/2024 11:46:30 AM TECHNIQUE: CT of the head was performed without the administration of intravenous contrast. Automated exposure control, iterative reconstruction, and/or weight based adjustment of the mA/kV was utilized to reduce the radiation dose to as low as reasonably achievable. COMPARISON: CT of the head dated 06/11/2020. CLINICAL HISTORY: Mental status  change, unknown cause. FINDINGS: BRAIN AND VENTRICLES: No acute intracranial hemorrhage. No mass effect or midline shift. No extra-axial fluid collection. No evidence of acute infarct. Patchy and confluent decreased attenuation throughout deep and periventricular white matter of cerebral hemispheres bilaterally, compatible with chronic microvascular ischemic disease. Partially empty sella. Cerebral ventricle sizes concordant with cerebral volume loss. No hydrocephalus. ORBITS: Bilateral lens replacement. SINUSES AND MASTOIDS: No acute abnormality. SOFT TISSUES AND SKULL: No acute skull fracture. No acute soft tissue abnormality. IMPRESSION: 1. No acute intracranial abnormality. 2. Chronic microvascular ischemic disease in the deep and periventricular white matter of cerebral hemispheres bilaterally. 3. Cerebral volume loss with ventricle sizes concordant with volume loss. 4. Partially empty sella. Electronically signed by: Evalene Coho MD 10/08/2024 12:14 PM EST RP Workstation: [id number]     Procedures   Medications Ordered in the ED  sodium chloride  0.9 % bolus 500 mL (has no  administration in time range)  azithromycin (ZITHROMAX) 500 mg in sodium chloride  0.9 % 250 mL IVPB (has no administration in time range)  cefTRIAXone (ROCEPHIN) 2 g in sodium chloride  0.9 % 100 mL IVPB (has no administration in time range)                                    Medical Decision Making Amount and/or Complexity of Data Reviewed Labs: ordered. Radiology: ordered.  Risk Decision regarding hospitalization.   This patient is a 88 y.o. female who presents to the ED for concern of altered mental status, this involves an extensive number of treatment options, and is a complaint that carries with it a high risk of complications and morbidity. The emergent differential diagnosis prior to evaluation includes, but is not limited to,  Drug-related, hypoxia, hyper/hypoglycemia, encephalopathy, sepsis, DKA/HHS, brain lesion, CVA, seizure, environmental, psychiatric   This is not an exhaustive differential.   Past Medical History / Co-morbidities / Social History:  has a past medical history of Arthritis, Blood transfusion without reported diagnosis, and Hypertension.  Patient with advanced dementia, history provided by family at bedside and over the phone  Additional history: Chart reviewed.   Physical Exam: Physical exam performed. The pertinent findings include:   Alert to self only, moves extremities equally and will follow some commands. Very difficult to understand speech  Abdomen soft and nontender  Lab Tests: I ordered, and personally interpreted labs.  The pertinent results include:  no leukocytosis, hgb 11.9, creatinine 1.35 (last check 0.93 3 years ago), UA with leukocytes, 11-20 WBCs, many bacteria concerning for infection   Imaging Studies: I ordered imaging studies including CXR, CT head. I independently visualized and interpreted imaging which showed   CXR: Suspect medial left lower lobe airspace disease, suboptimally evaluated on this oblique portable  radiograph.recommend short term radiographic follow up to exclude early infection or aspiration  CT: 1. No acute intracranial abnormality. 2. Chronic microvascular ischemic disease in the deep and periventricular white matter of cerebral hemispheres bilaterally. 3. Cerebral volume loss with ventricle sizes concordant with volume loss. 4. Partially empty sella.  I agree with the radiologist interpretation.   Cardiac Monitoring:  The patient was maintained on a cardiac monitor.  My attending physician Dr. Ginger viewed and interpreted the cardiac monitored which showed an underlying rhythm of: sinus rhythm, no STEMI. I agree with this interpretation.   Medications: I ordered medication including Rocephin, azithromycin, IV fluids  for aspiration pneumonia, UTI, dehydration. Reevaluation of the patient  after these medicines showed that the patient stayed the same. I have reviewed the patients home medicines and have made adjustments as needed.   Disposition: After consideration of the diagnostic results and the patients response to treatment, I feel that patient will require admission for altered mental status, UTI, also signs of aspiration pneumonia.  Patient with family at bedside who are understanding and in agreement with this.  Discussed patient with family medicine who accepts patient for admission.  I discussed this case with my attending physician Dr. Ginger who cosigned this note including patient's presenting symptoms, physical exam, and planned diagnostics and interventions. Attending physician stated agreement with plan or made changes to plan which were implemented.    Final diagnoses:  Altered mental status, unspecified altered mental status type  Aspiration pneumonia, unspecified aspiration pneumonia type, unspecified laterality, unspecified part of lung (HCC)  AKI (acute kidney injury)  Acute cystitis without hematuria    ED Discharge Orders     None           Nora Lauraine DELENA DEVONNA 10/08/24 1534    Fountain Derusha, Lauraine DELENA, PA-C 10/08/24 1534    Tegeler, Lonni PARAS, MD 10/08/24 [phone]

## 2024-10-08 NOTE — ED Notes (Signed)
 Writer moved pt to room 37. Abx and fluids restarted at this time. Family at bedside. Report given to [facility].

## 2024-10-08 NOTE — ED Provider Notes (Incomplete)
  Physical Exam  BP (!) 153/86   Pulse 84   Temp 98.6 F (37 C) (Rectal)   Resp 15   SpO2 97%   Physical Exam Vitals and nursing note reviewed.  Constitutional:      General: She is not in acute distress.    Appearance: She is well-developed.  HENT:     Head: Normocephalic and atraumatic.  Eyes:     Conjunctiva/sclera: Conjunctivae normal.  Cardiovascular:     Rate and Rhythm: Normal rate and regular rhythm.     Heart sounds: No murmur heard. Pulmonary:     Effort: Respiratory distress present.     Breath sounds: Wheezing present. No rhonchi or rales.  Abdominal:     Palpations: Abdomen is soft.     Tenderness: There is no abdominal tenderness.  Musculoskeletal:        General: No swelling.     Cervical back: Neck supple.  Skin:    General: Skin is warm and dry.     Capillary Refill: Capillary refill takes less than 2 seconds.  Neurological:     Mental Status: She is alert.  Psychiatric:        Mood and Affect: Mood normal.     Procedures  Procedures  ED Course / MDM   Clinical Course as of 10/09/24 0014  Mon Oct 08, 2024  1540 Demented, poss UTI, rocephin and azithro, poss aspiration PNA on XR. Fam med to see and admit.  [BS]    Clinical Course User Index [BS] Arlee Katz, MD   Medical Decision Making Amount and/or Complexity of Data Reviewed Labs: ordered. Radiology: ordered.  Risk Decision regarding hospitalization.   Patient with COPD exacerbation, evidence of sinus tachycardia versus a flutter on multiple EKGs, discussed with cardiology, recommend continued workup for cardiac evaluation inpatient.  Recommend admission to medicine service.  Discussed with medicine service concerning COPD exacerbation with hypoxic respiratory failure, overall agreeable to admission.  On 2 L O2 [place] at time of admission for hypoxia.  Improvement in respiratory status with current workup in the ED.        Arlee Katz, MD 10/09/24 [id number]

## 2024-10-08 NOTE — Assessment & Plan Note (Signed)
 Presenting with altered mentation - Treatment of CAP will treat UTI - Pending urine cultures

## 2024-10-08 NOTE — Hospital Course (Signed)
 [name] is a 88 y.o.female with a history of dementia, depression, hypertension, GERD who was admitted to the family medicine teaching Service at [facility] for altered mental status likely in the setting of UTI. Her hospital course is detailed below:  AMS Patient presented to ED for increased somnolence this morning with an episode of vomiting overnight.  Patient unable to give history as she is largely nonverbal.  In the ED, lab work was significant for UA with leukocytes, bacteria, white blood cells.  Given possible aspiration, chest x-ray was completed which showed possible focal consolidation on the left side.  CT head showed chronic microvascular changes but no acute cranial abnormalities.  Patient was without any respiratory symptoms and normal white blood cell count.  Patient was admitted and treated with ceftriaxone and azithromycin which was transitioned to cefadroxil on discharge.  Patient remained clinically stable and according to family was back at her baseline prior to discharge.  Given unclear picture of consolidation without any respiratory symptoms, fevers, leukocytosis, pneumonia less likely.  Believe altered mental status was likely acute encephalopathy secondary to UTI.  Patient had 1 episode overnight of agitation which required Haldol, thought to be acute delirium on top of her dementia.  AKI Patient presented with elevation of creatinine to 1.35 with baseline of 0.9.  Patient was treated with IV fluids.  AKI resolved prior to discharge.   Other chronic conditions were medically managed with home medications and formulary alternatives as necessary (HTN, dementia,)  PCP Follow-up Recommendations: Follow up on BMP for resolution of AKI Patient with episodes of delirium at night requiring Haldol, please consider adjusting regimen  Ensure completion of antibiotic course

## 2024-10-08 NOTE — Assessment & Plan Note (Signed)
 Small medial left lower airspace density near heart border seen on CXR, previously no other concerns for aspiration per sisters. - SLP consult pending - Will remain n.p.o. until evaluated - Continue IV ceftriaxone (tentative 5 day course) and azithromycin (tentative 3 day course) for CAP coverage

## 2024-10-08 NOTE — ED Notes (Signed)
 CCMD contacted for pt to be monitored.

## 2024-10-08 NOTE — Assessment & Plan Note (Signed)
 Likely multifactorial given context of dementia with possible exacerbating delirium in the context of acute illness.  Already appearing closer to baseline when evaluated in the ED with sisters - Admit to family medicine teaching service with Dr. Donzetta, vitals per MedSurg floor - PT/OT to evaluate and treat - Delirium and fall precautions - As needed Zofran  ODT if emesis returns - Will treat other medical conditions for hopeful improvement of mental status - AM CBC/TSH/BMP

## 2024-10-08 NOTE — Plan of Care (Addendum)
 Saw patient at bedside with granddaughter Alfred.  Granddaughter reported that emesis started early this morning at 2 AM with 1 episode of emesis, no diarrhea, patient then went back to sleep.  At 630 this morning when home health nursing team tried to arouse patient she was very sleepy and largely unresponsive, nursing staff was concerned and called EMS.  Granddaughter also reports that about 2 days ago there was some concern for UTI because of patient's increased aggression and agitation that started 2 days ago as per notes at [facility].  All records and care [facility].  We confirmed current occasions from granddaughter from [facility] records.    Med List: Citalopram  10mg  daily Donepezil  5mg  daily Losartan  100mg  daily memantine  10mg  BID Omeprazole  40mg  daily Spironolactone  25mg  daily Vit b12 VitD   Seroquel  25mg  nightly Melatonin 3mg  nightly

## 2024-10-08 NOTE — H&P (Addendum)
 Hospital Admission History and Physical Service Pager: [phone]  Patient name: Patricia Simmons Medical record number: [id number] Date of Birth: Apr 01, 1936 Age: 88 y.o. Gender: female  Primary Care Provider: Annabella Burner, DO Consultants: None Code Status: Temporarily full code, will discuss with daughter caretaker Nathanel who is POA Preferred Emergency Contact:  Contact Information     Name Relation Home Work [place] Daughter   [phone]   Lucas,Dorothy Sister   [phone]   Lashunda, Greis   [phone]      Other Contacts   None on File      Chief Complaint: Altered mental status  Differential and Medical Decision Making:  Patricia Simmons is a 88 y.o. female presenting with altered mental status worsening since this morning.  Differential for this patient's presentation of this includes: Urinary tract infection: Possible given treat cath UA with leukocytes, many bacteria, WBCs, altered mentation often associated. Aspiration pneumonia: Possible given CXR findings with possible early focal consolidation of left side, dementia with questionable swallowing function, less likely given no other respiratory symptoms, WBC WNL Delirium secondary to dementia: Baseline dementia, progressively worsening, possible some changes to schedule at home and viral illness made patient more delirious. Gastroenteritis: Possible explanation given vomiting last night, dark emesis, unclear whether having diarrheal symptoms, family visiting for holidays, less likely given no known sick contacts. Hyperthyroidism: No recent TSH, has not seen PCP in some time, altered mentation, less likely given normal blood pressures no other related symptoms increased appetite/weight loss/heat intolerance/diarrhea.  Assessment & Plan AMS (altered mental status) Likely multifactorial given context of dementia with possible exacerbating delirium in the context of acute illness.  Already appearing  closer to baseline when evaluated in the ED with sisters - Admit to family medicine teaching service with Dr. Donzetta, vitals per MedSurg floor - PT/OT to evaluate and treat - Delirium and fall precautions - As needed Zofran  ODT if emesis returns - Will treat other medical conditions for hopeful improvement of mental status - AM CBC/TSH/BMP Acute cystitis without hematuria Presenting with altered mentation - Treatment of CAP will treat UTI - Pending urine cultures Aspiration pneumonia (HCC) Small medial left lower airspace density near heart border seen on CXR, previously no other concerns for aspiration per sisters. - SLP consult pending - Will remain n.p.o. until evaluated - Continue IV ceftriaxone (tentative 5 day course) and azithromycin (tentative 3 day course) for CAP coverage Acute kidney injury (nontraumatic) Creatinine today is 1.35, last creatinine on file was 0.93 from 3 years ago but unclear whether AKI or worsening CKD - AM BMP - S/p 500 cc NS bolus in ED Chronic health problem Will wait to confirm chronic medical conditions with daughter Nathanel as med history appears updated with no recent fills   FEN/GI: Currently n.p.o. until SLP eval VTE Prophylaxis: [place] heparin, pending patient weight to consider switching to Lovenox   Disposition: SNF versus home with home health  History of Present Illness:  JALAYSHA SKILTON is a 88 y.o. female with unclear PMHx presenting with altered mental status and drowsiness since this morning.  Patient presented with 2 sisters and have not been able to contact caretaker daughter Nathanel, and patient largely nonverbal for history.  Per sisters, patient is usually more interactive saying a few words to like hello and her name, but this morning was very tired and not very arousable.  Had a few episodes of emesis overnight around 2 AM, reportedly was vomiting dark [place] vomit.  No  emesis or nausea this morning.  Reportedly did not eat anything unusual last  night, no reported diarrhea, or sick contacts, fevers, or chills.  Daughter did have much of her family over for Thanksgiving were visiting over the weekend.  In the ED, VSS in no acute distress, patient received a 500 cc NS bolus.  Noncontrast head CT found no acute intracranial abnormalities.  Initial labs found creatinine 1.35, eGFR 38, WBC 5.3, Hgb 11.9, straight cath UA found small leukocytes, many bacteria and 11-20 WBCs.  CXR found suspect medial left lower airspace disease with suboptimal view suggestive of early infection or aspiration, EKG was unremarkable.  When we had evaluated patient she had returned back to her neurologic baseline and was more interactive  Review Of Systems: Per HPI with the following additions: None  Pertinent Past Medical History: Hypertension Arthritis Remainder pending discussion with daughter Nathanel Collier reviewed in history tab.   Pertinent Past Surgical History: BTL, hemorrhoidectomy, abdominal hysterectomy Remainder reviewed in history tab.   Pertinent Social History: Unsure given unreliable historian  Pertinent Family History: Mother had a stroke Brother had lung cancer Daughter has dementia .   Important Outpatient Medications: Unsure, no reliable historian   Objective: BP (!) 153/86   Pulse 84   Temp 98.6 F (37 C) (Rectal)   Resp 15   SpO2 97%  Exam: General: Well-appearing, no acute distress, resting comfortably in bed Eyes: Closed but alertable to conversation Cardiovascular: Regular rate rhythm, no murmurs rubs or gallops Respiratory: Clear to auscultation bilaterally, but difficult to auscultate because patient not very mobile Gastrointestinal: Active bowel sounds, no abdominal distention, mild discomfort to deep palpation Derm: Warm, dry Neuro: Had apparent neurologic baseline, alert and oriented to person, largely unresponsive to questions Psych: Pleasant, noncombative  Labs:  CBC BMET  Recent Labs  Lab  10/08/24 1309  WBC 5.3  HGB 11.9*  HCT 36.8  PLT 187   Recent Labs  Lab 10/08/24 1309  NA 139  K 4.8  CL 100  CO2 29  BUN 31*  CREATININE 1.35*  GLUCOSE 106*  CALCIUM 9.7    Pertinent additional labs UA found small leukocytes, many bacteria and 11-20 WBCs .  EKG: Regular sinus rhythm, regular rate, borderline prolonged PR interval possibly artifact of low voltage EKG, no acute ST/T changes or abnormalities.   Imaging Studies Performed: CT head without contrast IMPRESSION: 1. No acute intracranial abnormality. 2. Chronic microvascular ischemic disease in the deep and periventricular white matter of cerebral hemispheres bilaterally. 3. Cerebral volume loss with ventricle sizes concordant with volume loss. 4. Partially empty sella.  CXR: Questionable medial left lower airspace density near heart border, mild pulmonary congestion with perihilar.    Lorrane Pac, MD 10/08/2024, 3:30 PM PGY-1, [facility] Family Medicine  FPTS Intern pager: [phone], text pages welcome Secure chat group [facility] Teaching Service

## 2024-10-08 NOTE — ED Triage Notes (Signed)
 Pt biba from home. Family reporting pt was difficult to awaken today. EMS states family states pt vomited last night. EMS states her baseline mental status has not changed per family. Pt has hx of dementia and only alert to self.

## 2024-10-08 NOTE — ED Notes (Signed)
 Pt incontinent of small amount of stool. Peri care provided.

## 2024-10-08 NOTE — Assessment & Plan Note (Signed)
 Creatinine today is 1.35, last creatinine on file was 0.93 from 3 years ago but unclear whether AKI or worsening CKD - AM BMP - S/p 500 cc NS bolus in ED

## 2024-10-08 NOTE — Assessment & Plan Note (Signed)
 Will wait to confirm chronic medical conditions with daughter Nathanel as med history appears updated with no recent fills

## 2024-10-09 ENCOUNTER — Encounter (HOSPITAL_COMMUNITY): Payer: Self-pay | Admitting: Family Medicine

## 2024-10-09 ENCOUNTER — Other Ambulatory Visit (HOSPITAL_COMMUNITY): Payer: Self-pay

## 2024-10-09 DIAGNOSIS — N3000 Acute cystitis without hematuria: Secondary | ICD-10-CM | POA: Diagnosis not present

## 2024-10-09 DIAGNOSIS — N179 Acute kidney failure, unspecified: Secondary | ICD-10-CM

## 2024-10-09 LAB — CBC
HCT: 37.3 % (ref 36.0–46.0)
Hemoglobin: 12.2 g/dL (ref 12.0–15.0)
MCH: 31.4 pg (ref 26.0–34.0)
MCHC: 32.7 g/dL (ref 30.0–36.0)
MCV: 95.9 fL (ref 80.0–100.0)
Platelets: 174 K/uL (ref 150–400)
RBC: 3.89 MIL/uL (ref 3.87–5.11)
RDW: 12.4 % (ref 11.5–15.5)
WBC: 4.5 K/uL (ref 4.0–10.5)
nRBC: 0 % (ref 0.0–0.2)

## 2024-10-09 LAB — BASIC METABOLIC PANEL WITH GFR
Anion gap: 9 (ref 5–15)
BUN: 18 mg/dL (ref 8–23)
CO2: 27 mmol/L (ref 22–32)
Calcium: 9.3 mg/dL (ref 8.9–10.3)
Chloride: 107 mmol/L (ref 98–111)
Creatinine, Ser: 0.92 mg/dL (ref 0.44–1.00)
GFR, Estimated: >60 mL/min (ref >60)
Glucose, Bld: 105 mg/dL — ABNORMAL HIGH (ref 70–99)
Potassium: 4.1 mmol/L (ref 3.5–5.1)
Sodium: 143 mmol/L (ref 135–145)

## 2024-10-09 LAB — TSH: TSH: 2.973 u[IU]/mL (ref 0.350–4.500)

## 2024-10-09 MED ORDER — ORAL CARE MOUTH RINSE: 15.0000 mL | OROMUCOSAL | Status: DC | PRN

## 2024-10-09 MED ORDER — CEFADROXIL 500 MG PO CAPS
500.0000 mg | ORAL_CAPSULE | Freq: Two times a day (BID) | ORAL | 0 refills | Status: AC
  Filled 2024-10-09: qty 11, 6d supply, fill #0

## 2024-10-09 MED ORDER — SPIRONOLACTONE 25 MG PO TABS: 25.0000 mg | ORAL_TABLET | Freq: Every day | ORAL | Status: AC

## 2024-10-09 MED ORDER — CYANOCOBALAMIN 1000 MCG PO TABS: 1000.0000 ug | ORAL_TABLET | Freq: Every day | ORAL | Status: AC

## 2024-10-09 MED ORDER — CEFADROXIL 500 MG PO CAPS
500.0000 mg | ORAL_CAPSULE | Freq: Two times a day (BID) | ORAL | Status: DC
  Filled 2024-10-09 (×2): qty 1

## 2024-10-09 MED ORDER — CHOLECALCIFEROL 10 MCG (400 UNIT) PO TABS: 400.0000 [IU] | ORAL_TABLET | Freq: Every day | ORAL | Status: AC

## 2024-10-09 MED ORDER — HALOPERIDOL LACTATE 5 MG/ML IJ SOLN
1.0000 mg | Freq: Once | INTRAMUSCULAR | Status: AC
  Administered 2024-10-09: 1 mg via INTRAVENOUS
  Filled 2024-10-09: qty 1

## 2024-10-09 NOTE — TOC CM/SW Note (Addendum)
 No family at bedside. NCM attempted to call her daughter Sukari Grist at number listed [phone], received message number cannot be completed as dialed.   NCM called [facility] spoke to [place] . Patient's legal guardian is [employer] [phone] and Anastassia Noack number is [phone] ( corrected in EPIC).   [facility] [phone]   NCM left patient's [facility] social work Jon Novak a voicemail   NCM called [employer]. She is patient's legal guardian , however told NCM patient lives with Nathanel and everything can be discussed with Nathanel.   NCM does not see any legal guardian paperwork on chart. Lasha Hickman will email NCM paperwork.   Discussed PT/OT recommendations with Lasha Hickman , return to home and continue with [facility]. Ms Hickman in agreement .   NCM called Nathanel Daring and left voicemail .   Spoke to attending team . They spoke to [facility] earlier today and let her know Odalys will be discharging today.   NCM called Alfred , Nathanel will be here shortly to transport her home   Jon Novak with [facility] aware discharge is today and asking when DC summary will be in . Attending doing DC summary now

## 2024-10-09 NOTE — Progress Notes (Signed)
     Daily Progress Note Intern Pager: [phone]  Patient name: Patricia Simmons Medical record number: [id number] Date of birth: Mar 20, 1936 Age: 88 y.o. Gender: female  Primary Care Provider: Cloria Annabella CROME, DO Consultants: None Code Status: Full  Pt Overview and Major Events to Date:  12/1 - Admitted to FMTS  Medical Decision Making:  Patricia Simmons is a 88 yo female with PMHx of dementia, depression, HTN, GERD, admitted for AMS in setting of UTI and questionable CAP. Today doing much better and returned to her baseline self, has shown no systemic symptoms of infection.   Assessment & Plan AMS (altered mental status) Likely multifactorial given context of dementia with possible exacerbating delirium in the context of acute illness.  Has returned back to baseline since yesterday evening - As needed Zofran  ODT if emesis returns - AM CBC/TSH/BMP Acute cystitis without hematuria Presenting with altered mentation - Treatment of CAP will treat UTI - Pending urine cultures Aspiration pneumonia (HCC) Small medial left lower airspace density near heart border seen on CXR, previously no other concerns for aspiration per sisters. - SLP consult pending - Will remain n.p.o. until evaluated - Continue IV ceftriaxone (tentative 5 day course) and azithromycin (tentative 3 day course) for CAP coverage Acute kidney injury (nontraumatic) (Resolved: 10/09/2024) Creatinine today much improved 0.92 from 1.35,  - S/p 500 cc NS bolus in ED Chronic health problem Will wait to confirm chronic medical conditions with daughter Nathanel as med history appears updated with no recent fills   FEN/GI: Regular diet PPx:  heparin Dispo:Home today.   Subjective:  Some agitation overnight and fighting nursing, received 1 dose of Haldol and fell asleep.  Objective: Temp:  [96.7 F (35.9 C)-99.1 F (37.3 C)] 97.6 F (36.4 C) (12/02 0513) Pulse Rate:  [78-104] 84 (12/02 0513) Resp:  [13-18] 18 (12/02  0513) BP: (115-153)/(65-116) 131/80 (12/02 0513) SpO2:  [71 %-100 %] 100 % (12/02 0513) Physical Exam: General: Well-appearing, no acute distress Cardiovascular: Regular rate rhythm, no murmurs rubs or gallops Respiratory: Clear to auscultation bilaterally, no wheezes or crackles Abdomen: Active bowel sounds, mildly tender to deep palpation, no rebound or guarding Extremities: Nonedematous, nontender  Laboratory: Most recent CBC Lab Results  Component Value Date   WBC 4.5 10/09/2024   HGB 12.2 10/09/2024   HCT 37.3 10/09/2024   MCV 95.9 10/09/2024   PLT 174 10/09/2024   Most recent BMP    Latest Ref Rng & Units 10/09/2024    4:51 AM  BMP  Glucose 70 - 99 mg/dL 894   BUN 8 - 23 mg/dL 18   Creatinine 9.55 - 1.00 mg/dL 9.07   Sodium 864 - 854 mmol/L 143   Potassium 3.5 - 5.1 mmol/L 4.1   Chloride 98 - 111 mmol/L 107   CO2 22 - 32 mmol/L 27   Calcium 8.9 - 10.3 mg/dL 9.3     Other pertinent labs TSH 2.973  Imaging/Diagnostic Tests: No new imaging  Lorrane Pac, MD 10/09/2024, 8:46 AM  PGY-1, [place] [facility] FPTS Intern pager: [phone], text pages welcome Secure chat group [facility] Teaching Service

## 2024-10-09 NOTE — Discharge Summary (Signed)
 Family Medicine Teaching [facility] Discharge Summary  Patient name: Patricia Simmons Medical record number: [id number] Date of birth: 1936/06/28 Age: 88 y.o. Gender: female Date of Admission: 10/08/2024  Date of Discharge: 10/09/2024 Admitting Physician: Twyla Nearing, MD  Primary Care Provider: Cloria Annabella CROME, DO Consultants: None  Indication for Hospitalization: Altered mental status  Discharge Diagnoses/Problem List:  Principal Problem for Admission: Acute encephalopathy secondary to UTI Other Problems addressed during stay:  Principal Problem:   AMS (altered mental status) Active Problems:   Acute cystitis without hematuria   Aspiration pneumonia (HCC)   Chronic health problem   AKI (acute kidney injury)   The above problem list has been updated and reviewed for accuracy, including the initial reason for admission.   Brief Hospital Course:  Patricia Simmons is a 88 y.o.female with a history of dementia, depression, hypertension, GERD who was admitted to the family medicine teaching Service at [facility] for altered mental status likely in the setting of UTI. Her hospital course is detailed below:  AMS Patient presented to ED for increased somnolence this morning with an episode of vomiting overnight.  Patient unable to give history as she is largely nonverbal.  In the ED, lab work was significant for UA with leukocytes, bacteria, white blood cells.  Given possible aspiration, chest x-ray was completed which showed possible focal consolidation on the left side.  CT head showed chronic microvascular changes but no acute cranial abnormalities.  Patient was without any respiratory symptoms and normal white blood cell count.  Patient was admitted and treated with ceftriaxone and azithromycin which was transitioned to cefadroxil on discharge.  Patient remained clinically stable and according to family was back at her baseline prior to discharge.  Given unclear picture of consolidation  without any respiratory symptoms, fevers, leukocytosis, pneumonia less likely.  Believe altered mental status was likely acute encephalopathy secondary to UTI.  Patient had 1 episode overnight of agitation which required Haldol, thought to be acute delirium on top of her dementia.  AKI Patient presented with elevation of creatinine to 1.35 with baseline of 0.9.  Patient was treated with IV fluids.  AKI resolved prior to discharge.   Other chronic conditions were medically managed with home medications and formulary alternatives as necessary (HTN, dementia,)  PCP Follow-up Recommendations: Follow up on BMP for resolution of AKI Patient with episodes of delirium at night requiring Haldol, please consider adjusting regimen  Ensure completion of antibiotic course     Results/Tests Pending at Time of Discharge:  Unresulted Labs (From admission, onward)    None        Disposition: Home with [facility]  Discharge Condition: Stable  Discharge Exam:  Vitals:   10/09/24 0513 10/09/24 1019  BP: 131/80 123/76  Pulse: 84 88  Resp: 18 16  Temp: 97.6 F (36.4 C) 98 F (36.7 C)  SpO2: 100%    Per Dr. Lorrane: General: Well-appearing, no acute distress Cardiovascular: Regular rate rhythm, no murmurs rubs or gallops Respiratory: Clear to auscultation bilaterally, no wheezes or crackles Abdomen: Active bowel sounds, mildly tender to deep palpation, no rebound or guarding Extremities: Nonedematous, nontender    Significant Procedures: None  Significant Labs and Imaging:  Recent Labs  Lab 10/08/24 1309 10/09/24 0451  WBC 5.3 4.5  HGB 11.9* 12.2  HCT 36.8 37.3  PLT 187 174   Recent Labs  Lab 10/08/24 1309 10/09/24 0451  NA 139 143  K 4.8 4.1  CL 100 107  CO2 29 27  GLUCOSE 106* 105*  BUN 31* 18  CREATININE 1.35* 0.92  CALCIUM 9.7 9.3  ALKPHOS 63  --   AST 20  --   ALT 10  --   ALBUMIN 3.6  --      Pertinent Imaging    CT head (10/08/2024): 1. No acute  intracranial abnormality. 2. Chronic microvascular ischemic disease in the deep and periventricular white matter of cerebral hemispheres bilaterally. 3. Cerebral volume loss with ventricle sizes concordant with volume loss. 4. Partially empty sella.  Chest x-ray (10/08/2024): 1. Suspect medial left lower lobe airspace disease, suboptimally evaluated on this oblique portable radiograph.recommend short term radiographic follow up to exclude early infection or aspiration  Discharge Medications:  Allergies as of 10/09/2024       Reactions   Codeine    unknown        Medication List     STOP taking these medications    hydrochlorothiazide  12.5 MG tablet Commonly known as: HYDRODIURIL    ipratropium 0.03 % nasal spray Commonly known as: ATROVENT    Potassium Chloride  ER 20 MEQ Tbcr   triamcinolone  cream 0.1 % Commonly known as: KENALOG        TAKE these medications    acetaminophen  500 MG tablet Commonly known as: TYLENOL  Take 1,000 mg by mouth every 6 (six) hours as needed for mild pain or headache.   cefadroxil 500 MG capsule Commonly known as: DURICEF Take 1 capsule (500 mg total) by mouth 2 (two) times daily for 11 doses.   cholecalciferol 10 MCG (400 UNIT) Tabs tablet Commonly known as: VITAMIN D3 Take 1 tablet (400 Units total) by mouth daily. Start taking on: October 10, 2024   citalopram  10 MG tablet Commonly known as: CELEXA  Take 1 tablet by mouth once daily   cyanocobalamin 1000 MCG tablet Take 1 tablet (1,000 mcg total) by mouth daily. Start taking on: October 10, 2024   diclofenac  sodium 1 % Gel Commonly known as: VOLTAREN  Apply 2 g topically 4 (four) times daily.   donepezil  10 MG tablet Commonly known as: ARICEPT  TAKE 1 TABLET BY MOUTH IN THE EVENING MUST  BE  SEEN  FOR  FURTHER  REFILLS.  CALL  [phone]   losartan  50 MG tablet Commonly known as: COZAAR  Take 1 tablet by mouth once daily   magic mouthwash w/lidocaine  Soln Take 5 mLs by  mouth 3 (three) times daily as needed for mouth pain.   memantine  10 MG tablet Commonly known as: NAMENDA  Take 1 tablet (10 mg total) by mouth 2 (two) times daily.   omeprazole  40 MG capsule Commonly known as: PRILOSEC Take 1 capsule by mouth once daily   QUEtiapine  25 MG tablet Commonly known as: SEROQUEL  Take 1 tablet by mouth once daily   spironolactone 25 MG tablet Commonly known as: ALDACTONE Take 1 tablet (25 mg total) by mouth daily. Start taking on: October 10, 2024        Discharge Instructions: Please refer to Patient Instructions section of EMR for full details.  Patient was counseled important signs and symptoms that should prompt return to medical care, changes in medications, dietary instructions, activity restrictions, and follow up appointments.   Follow-Up Appointments:  Follow-up Information     Reed, Tiffany L, DO. Call in 1 week(s).   Specialty: Geriatric Medicine Contact information: [address] [place] [address] [phone]                 Lonnie Earnest, MD 10/09/2024, 2:25 PM PGY-2, [facility]

## 2024-10-09 NOTE — Assessment & Plan Note (Signed)
 Presenting with altered mentation - Treatment of CAP will treat UTI - Pending urine cultures

## 2024-10-09 NOTE — Discharge Instructions (Addendum)
 Dear Patricia Simmons,  Thank you for letting us  participate in your care. You were hospitalized for  AMS (altered mental status) and you were treated with antibiotics for a possible UTI. Please complete your course of antibiotics.   POST-HOSPITAL & CARE INSTRUCTIONS Please complete your antibiotics Please follow up with your PCP in the next week, they may need to get some more lab work    DOCTOR'S APPOINTMENT    Follow-up Information     Reed, Tiffany L, DO. Call in 1 week(s).   Specialty: Geriatric Medicine Contact information: [address] [place] [address] [phone]                 Take care and be well!  Family Medicine Teaching Service Inpatient Team [place]  [facility]  [address] [place], [place] [address] [phone]

## 2024-10-09 NOTE — Assessment & Plan Note (Signed)
 Will wait to confirm chronic medical conditions with daughter Nathanel as med history appears updated with no recent fills

## 2024-10-09 NOTE — Assessment & Plan Note (Addendum)
 Likely multifactorial given context of dementia with possible exacerbating delirium in the context of acute illness.  Has returned back to baseline since yesterday evening - As needed Zofran  ODT if emesis returns - AM CBC/TSH/BMP

## 2024-10-09 NOTE — Evaluation (Signed)
 Occupational Therapy Evaluation Patient Details Name: Patricia Simmons MRN: [id number] DOB: 12/23/1935 Today's Date: 10/09/2024   History of Present Illness   88 yo F who presents 10/08/24 for increased somnolence after an episode of vomiting overnight. CT head without acute abnormality.  PMH dementia only oriented to self, HTN, depression     Clinical Impressions Patricia Simmons was evaluated s/p the above admission list. She lives with family and is active with pace at baseline. Upon evaluation, pt demonstrated min A ability to complete mobility and ADLs. Assist needed for baseline dementia history, cues for initiation/sequencing and light physical assist. Pt is likely at her baseline and does not require further acute, or follow up OT services. Recommend discharge back to pt's environment with assist as needed. OT to sign off with appreciation of order, please re-consult if needed.       If plan is discharge home, recommend the following:   A little help with walking and/or transfers;A little help with bathing/dressing/bathroom;Assistance with cooking/housework;Direct supervision/assist for medications management;Direct supervision/assist for financial management;Assist for transportation;Help with stairs or ramp for entrance;Supervision due to cognitive status     Functional Status Assessment   Patient has had a recent decline in their functional status and demonstrates the ability to make significant improvements in function in a reasonable and predictable amount of time.     Equipment Recommendations   None recommended by OT      Precautions/Restrictions   Precautions Precautions: Fall;Other (comment) Recall of Precautions/Restrictions: Impaired Precaution/Restrictions Comments: h/o dementia Restrictions Weight Bearing Restrictions Per Provider Order: No     Mobility Bed Mobility Overal bed mobility: Needs Assistance Bed Mobility: Supine to Sit     Supine to sit: Mod  assist          Transfers Overall transfer level: Needs assistance Equipment used: None Transfers: Sit to/from Stand Sit to Stand: Min assist           General transfer comment: gentle assist needed to stand and HHA to guide pt      Balance Overall balance assessment: Needs assistance Sitting-balance support: No upper extremity supported, Feet unsupported Sitting balance-Leahy Scale: Fair     Standing balance support: Single extremity supported, During functional activity Standing balance-Leahy Scale: Fair                             ADL either performed or assessed with clinical judgement   ADL Overall ADL's : Needs assistance/impaired Eating/Feeding: Set up   Grooming: Supervision/safety   Upper Body Bathing: Set up   Lower Body Bathing: Minimal assistance   Upper Body Dressing : Minimal assistance   Lower Body Dressing: Minimal assistance   Toilet Transfer: Minimal assistance   Toileting- Clothing Manipulation and Hygiene: Supervision/safety;Sitting/lateral lean       Functional mobility during ADLs: Minimal assistance General ADL Comments: likely at baseline. pt assist needed for baseline dementia     Vision Baseline Vision/History: 0 No visual deficits       Perception Perception: Within Functional Limits       Praxis Praxis: WFL       Pertinent Vitals/Pain Pain Assessment Pain Assessment: Faces Faces Pain Scale: No hurt     Extremity/Trunk Assessment Upper Extremity Assessment Upper Extremity Assessment: Generalized weakness   Lower Extremity Assessment Lower Extremity Assessment: Defer to PT evaluation   Cervical / Trunk Assessment Cervical / Trunk Assessment: Kyphotic   Communication Communication Communication: No apparent difficulties  Cognition Arousal: Alert Behavior During Therapy: WFL for tasks assessed/performed Cognition: History of cognitive impairments           Following commands:  Impaired Following commands impaired: Follows one step commands inconsistently     Cueing  General Comments   Cueing Techniques: Verbal cues;Tactile cues  VSS on RA           Home Living Family/patient expects to be discharged to:: Private residence Living Arrangements: Children;Other relatives Available Help at Discharge: Family;Personal care attendant               Additional Comments: pt unable to provide information due to dementia; per chart attends [facility]      Prior Functioning/Environment Prior Level of Function : Patient poor historian/Family not available             Mobility Comments: when RW placed in front of her it seemed foreign to her      OT Problem List: Decreased activity tolerance;Decreased cognition;Decreased safety awareness   OT Treatment/Interventions:        OT Goals(Current goals can be found in the care plan section)   Acute Rehab OT Goals OT Goal Formulation: Patient unable to participate in goal setting Time For Goal Achievement: 10/09/24 Potential to Achieve Goals: Good   OT Frequency:       Co-evaluation PT/OT/SLP Co-Evaluation/Treatment: Yes Reason for Co-Treatment: Necessary to address cognition/behavior during functional activity;To address functional/ADL transfers PT goals addressed during session: Mobility/safety with mobility;Balance OT goals addressed during session: ADL's and self-care      AM-PAC OT 6 Clicks Daily Activity     Outcome Measure Help from another person eating meals?: A Little Help from another person taking care of personal grooming?: A Little Help from another person toileting, which includes using toliet, bedpan, or urinal?: A Little Help from another person bathing (including washing, rinsing, drying)?: A Little Help from another person to put on and taking off regular upper body clothing?: A Little Help from another person to put on and taking off regular lower body clothing?: A Little 6  Click Score: 18   End of Session Equipment Utilized During Treatment: Gait belt Nurse Communication: Mobility status  Activity Tolerance: Patient tolerated treatment well Patient left: in chair;with call bell/phone within reach;with chair alarm set  OT Visit Diagnosis: Unsteadiness on feet (R26.81);Other abnormalities of gait and mobility (R26.89);Muscle weakness (generalized) (M62.81)                Time: 8943-8882 OT Time Calculation (min): 21 min Charges:  OT General Charges $OT Visit: 1 Visit OT Evaluation $OT Eval Low Complexity: 1 Low  Lucie Kendall, OTR/L Acute Rehabilitation Services Office [phone] Secure Chat Communication Preferred   Lucie JONETTA Kendall 10/09/2024, 11:49 AM

## 2024-10-09 NOTE — Plan of Care (Signed)
  Problem: Coping: Goal: Level of anxiety will decrease Outcome: Progressing   Problem: Education: Goal: Knowledge of General Education information will improve Description: Including pain rating scale, medication(s)/side effects and non-pharmacologic comfort measures Outcome: Not Progressing   Problem: Health Behavior/Discharge Planning: Goal: Ability to manage health-related needs will improve Outcome: Not Progressing   Problem: Activity: Goal: Risk for activity intolerance will decrease Outcome: Not Progressing

## 2024-10-09 NOTE — Assessment & Plan Note (Addendum)
 Creatinine today much improved 0.92 from 1.35,  - S/p 500 cc NS bolus in ED

## 2024-10-09 NOTE — Assessment & Plan Note (Signed)
 Small medial left lower airspace density near heart border seen on CXR, previously no other concerns for aspiration per sisters. - SLP consult pending - Will remain n.p.o. until evaluated - Continue IV ceftriaxone (tentative 5 day course) and azithromycin (tentative 3 day course) for CAP coverage

## 2024-10-09 NOTE — Evaluation (Signed)
 Physical Therapy Evaluation and Discharge Patient Details Name: Patricia Simmons MRN: [id number] DOB: August 12, 1936 Today's Date: 10/09/2024  History of Present Illness  88 yo F who presents 10/08/24 for increased somnolence after an episode of vomiting overnight. CT head without acute abnormality.  PMH dementia only oriented to self, HTN, depression  Clinical Impression  Patient evaluated by Physical Therapy with no further acute PT needs identified. No family present to confirm baseline status however pt able to ambulate with CGA to min assist with hand-held assist. (Mostly needs guidance to continue walking and in which direction). Anticipate she is at or near her baseline. Noted she attends [facility] and see no barriers to her returning from a mobility standpoint. PT is signing off. Will ask Mobility Team to work with pt while admitted. Thank you for this referral.         If plan is discharge home, recommend the following: A little help with walking and/or transfers;A lot of help with bathing/dressing/bathroom;Assist for transportation;Supervision due to cognitive status;Help with stairs or ramp for entrance   Can travel by private vehicle        Equipment Recommendations None recommended by PT  Recommendations for Other Services       Functional Status Assessment Patient has not had a recent decline in their functional status     Precautions / Restrictions Precautions Precautions: Fall;Other (comment) Recall of Precautions/Restrictions: Impaired Precaution/Restrictions Comments: h/o dementia      Mobility  Bed Mobility Overal bed mobility: Needs Assistance Bed Mobility: Supine to Sit     Supine to sit: Mod assist     General bed mobility comments: able to come up to sitting with legs over EOB with min assist, however required mod assist to scoot out to EOB and put feet on the floor    Transfers Overall transfer level: Needs assistance Equipment used: None Transfers: Sit  to/from Stand Sit to Stand: Min assist           General transfer comment: as assisting pt to scoot forward she moved right into coming to stand; very light assist    Ambulation/Gait Ambulation/Gait assistance: Min assist, Contact guard assist Gait Distance (Feet): 60 Feet (toileted; 12) Assistive device: 1 person hand held assist, 2 person hand held assist Gait Pattern/deviations: Step-through pattern, Decreased stride length   Gait velocity interpretation: 1.31 - 2.62 ft/sec, indicative of limited community ambulator   General Gait Details: provided RW upon standing and pt did not show signs of knowing how to use it; provided bil HHA and pt began walking; progressed to single UE support primarily providing guidance to continue in directions desired by PT  Stairs            Wheelchair Mobility     Tilt Bed    Modified Rankin (Stroke Patients Only)       Balance Overall balance assessment: Needs assistance Sitting-balance support: No upper extremity supported, Feet unsupported Sitting balance-Leahy Scale: Fair     Standing balance support: Single extremity supported, During functional activity Standing balance-Leahy Scale: Fair                               Pertinent Vitals/Pain Pain Assessment Pain Assessment: Faces Faces Pain Scale: No hurt    Home Living Family/patient expects to be discharged to:: Private residence Living Arrangements: Children;Other relatives (per chart ?daughter and grandaughter) Available Help at Discharge: Family;Personal care attendant  Additional Comments: pt unable to provide information due to dementia; per chart attends [facility]    Prior Function Prior Level of Function : Patient poor historian/Family not available             Mobility Comments: when RW placed in front of her it seemed foreign to her       Extremity/Trunk Assessment   Upper Extremity Assessment Upper Extremity  Assessment: Defer to OT evaluation    Lower Extremity Assessment Lower Extremity Assessment: Generalized weakness    Cervical / Trunk Assessment Cervical / Trunk Assessment: Normal  Communication   Communication Communication: No apparent difficulties    Cognition Arousal: Alert Behavior During Therapy: WFL for tasks assessed/performed   PT - Cognitive impairments: History of cognitive impairments, No family/caregiver present to determine baseline                       PT - Cognition Comments: able to state her name, not DOB; follows simple commands Following commands: Impaired Following commands impaired: Follows one step commands inconsistently     Cueing Cueing Techniques: Verbal cues, Tactile cues     General Comments      Exercises     Assessment/Plan    PT Assessment Patient does not need any further PT services  PT Problem List         PT Treatment Interventions      PT Goals (Current goals can be found in the Care Plan section)  Acute Rehab PT Goals Patient Stated Goal: unable to state PT Goal Formulation: All assessment and education complete, DC therapy    Frequency       Co-evaluation PT/OT/SLP Co-Evaluation/Treatment: Yes Reason for Co-Treatment: Necessary to address cognition/behavior during functional activity;To address functional/ADL transfers PT goals addressed during session: Mobility/safety with mobility;Balance         AM-PAC PT 6 Clicks Mobility  Outcome Measure Help needed turning from your back to your side while in a flat bed without using bedrails?: A Little Help needed moving from lying on your back to sitting on the side of a flat bed without using bedrails?: A Lot Help needed moving to and from a bed to a chair (including a wheelchair)?: A Little Help needed standing up from a chair using your arms (e.g., wheelchair or bedside chair)?: A Little Help needed to walk in hospital room?: A Little Help needed climbing  3-5 steps with a railing? : A Little 6 Click Score: 17    End of Session Equipment Utilized During Treatment: Gait belt Activity Tolerance: Patient tolerated treatment well Patient left: in chair;with call bell/phone within reach;with chair alarm set Nurse Communication: Mobility status PT Visit Diagnosis: Other abnormalities of gait and mobility (R26.89)    Time: 8942-8883 PT Time Calculation (min) (ACUTE ONLY): 19 min   Charges:   PT Evaluation $PT Eval Low Complexity: 1 Low   PT General Charges $$ ACUTE PT VISIT: 1 Visit          Macario RAMAN, PT Acute Rehabilitation Services  Office [phone]   Macario SHAUNNA Soja 10/09/2024, 11:35 AM

## 2024-10-10 LAB — URINE CULTURE: Culture: >=100000 — AB
